# Patient Record
Sex: Female | Born: 1940 | Race: White | Hispanic: No | Marital: Married | Smoking: Former smoker
Health system: Southern US, Community
[De-identification: ages and names within clinical notes are randomized; demographics above are authoritative.]

## PROBLEM LIST (undated history)

## (undated) DIAGNOSIS — F0390 Unspecified dementia without behavioral disturbance: Secondary | ICD-10-CM

## (undated) DIAGNOSIS — I4891 Unspecified atrial fibrillation: Secondary | ICD-10-CM

## (undated) DIAGNOSIS — I639 Cerebral infarction, unspecified: Secondary | ICD-10-CM

## (undated) DIAGNOSIS — M1990 Unspecified osteoarthritis, unspecified site: Secondary | ICD-10-CM

## (undated) DIAGNOSIS — F419 Anxiety disorder, unspecified: Secondary | ICD-10-CM

## (undated) DIAGNOSIS — I10 Essential (primary) hypertension: Secondary | ICD-10-CM

## (undated) DIAGNOSIS — C801 Malignant (primary) neoplasm, unspecified: Secondary | ICD-10-CM

## (undated) HISTORY — PX: CHOLECYSTECTOMY: SHX55

## (undated) HISTORY — PX: TONSILLECTOMY: SUR1361

## (undated) HISTORY — PX: BRAIN SURGERY: SHX531

## (undated) HISTORY — PX: FEMUR CLOSED REDUCTION: SHX939

## (undated) HISTORY — PX: KNEE ARTHROSCOPY: SUR90

## (undated) HISTORY — PX: APPENDECTOMY: SHX54

---

## 1998-11-23 ENCOUNTER — Other Ambulatory Visit: Admission: RE | Admit: 1998-11-23 | Discharge: 1998-11-23 | Payer: Self-pay | Admitting: Gynecology

## 1998-12-05 ENCOUNTER — Ambulatory Visit (HOSPITAL_COMMUNITY): Admission: RE | Admit: 1998-12-05 | Discharge: 1998-12-05 | Payer: Self-pay | Admitting: Gynecology

## 1999-07-27 ENCOUNTER — Encounter: Payer: Self-pay | Admitting: Specialist

## 1999-07-27 ENCOUNTER — Ambulatory Visit (HOSPITAL_COMMUNITY): Admission: RE | Admit: 1999-07-27 | Discharge: 1999-07-27 | Payer: Self-pay | Admitting: Specialist

## 1999-08-18 ENCOUNTER — Encounter: Payer: Self-pay | Admitting: Specialist

## 1999-08-18 ENCOUNTER — Ambulatory Visit (HOSPITAL_COMMUNITY): Admission: RE | Admit: 1999-08-18 | Discharge: 1999-08-18 | Payer: Self-pay | Admitting: Specialist

## 1999-10-20 ENCOUNTER — Encounter: Payer: Self-pay | Admitting: Gynecology

## 1999-10-20 ENCOUNTER — Encounter: Admission: RE | Admit: 1999-10-20 | Discharge: 1999-10-20 | Payer: Self-pay | Admitting: Gynecology

## 2000-10-17 ENCOUNTER — Ambulatory Visit (HOSPITAL_COMMUNITY): Admission: RE | Admit: 2000-10-17 | Discharge: 2000-10-17 | Payer: Self-pay | Admitting: Obstetrics and Gynecology

## 2000-10-21 ENCOUNTER — Encounter: Payer: Self-pay | Admitting: Obstetrics and Gynecology

## 2000-10-21 ENCOUNTER — Encounter: Admission: RE | Admit: 2000-10-21 | Discharge: 2000-10-21 | Payer: Self-pay | Admitting: Obstetrics and Gynecology

## 2001-03-12 ENCOUNTER — Encounter: Admission: RE | Admit: 2001-03-12 | Discharge: 2001-03-12 | Payer: Self-pay | Admitting: Internal Medicine

## 2001-03-12 ENCOUNTER — Encounter: Payer: Self-pay | Admitting: Internal Medicine

## 2001-10-30 ENCOUNTER — Encounter: Admission: RE | Admit: 2001-10-30 | Discharge: 2001-10-30 | Payer: Self-pay | Admitting: Obstetrics and Gynecology

## 2001-10-30 ENCOUNTER — Encounter: Payer: Self-pay | Admitting: Obstetrics and Gynecology

## 2002-07-13 ENCOUNTER — Observation Stay (HOSPITAL_COMMUNITY): Admission: EM | Admit: 2002-07-13 | Discharge: 2002-07-14 | Payer: Self-pay | Admitting: Emergency Medicine

## 2002-09-18 ENCOUNTER — Encounter: Payer: Self-pay | Admitting: Internal Medicine

## 2002-09-18 ENCOUNTER — Encounter: Admission: RE | Admit: 2002-09-18 | Discharge: 2002-09-18 | Payer: Self-pay | Admitting: Internal Medicine

## 2003-01-21 ENCOUNTER — Encounter: Admission: RE | Admit: 2003-01-21 | Discharge: 2003-01-21 | Payer: Self-pay | Admitting: Urology

## 2003-01-21 ENCOUNTER — Encounter: Payer: Self-pay | Admitting: Urology

## 2003-01-27 ENCOUNTER — Ambulatory Visit (HOSPITAL_BASED_OUTPATIENT_CLINIC_OR_DEPARTMENT_OTHER): Admission: RE | Admit: 2003-01-27 | Discharge: 2003-01-27 | Payer: Self-pay | Admitting: Urology

## 2003-01-27 ENCOUNTER — Encounter (INDEPENDENT_AMBULATORY_CARE_PROVIDER_SITE_OTHER): Payer: Self-pay | Admitting: Specialist

## 2003-07-02 ENCOUNTER — Encounter: Admission: RE | Admit: 2003-07-02 | Discharge: 2003-07-02 | Payer: Self-pay | Admitting: Obstetrics and Gynecology

## 2003-10-20 ENCOUNTER — Encounter (INDEPENDENT_AMBULATORY_CARE_PROVIDER_SITE_OTHER): Payer: Self-pay | Admitting: Specialist

## 2003-10-20 ENCOUNTER — Ambulatory Visit (HOSPITAL_BASED_OUTPATIENT_CLINIC_OR_DEPARTMENT_OTHER): Admission: RE | Admit: 2003-10-20 | Discharge: 2003-10-20 | Payer: Self-pay | Admitting: Urology

## 2003-10-20 ENCOUNTER — Ambulatory Visit (HOSPITAL_COMMUNITY): Admission: RE | Admit: 2003-10-20 | Discharge: 2003-10-20 | Payer: Self-pay | Admitting: Urology

## 2004-07-04 ENCOUNTER — Ambulatory Visit: Payer: Self-pay | Admitting: Internal Medicine

## 2004-07-27 ENCOUNTER — Encounter: Admission: RE | Admit: 2004-07-27 | Discharge: 2004-07-27 | Payer: Self-pay | Admitting: Obstetrics and Gynecology

## 2004-08-01 ENCOUNTER — Ambulatory Visit: Payer: Self-pay | Admitting: Internal Medicine

## 2004-08-09 ENCOUNTER — Encounter: Admission: RE | Admit: 2004-08-09 | Discharge: 2004-08-09 | Payer: Self-pay | Admitting: Urology

## 2004-08-11 ENCOUNTER — Ambulatory Visit (HOSPITAL_BASED_OUTPATIENT_CLINIC_OR_DEPARTMENT_OTHER): Admission: RE | Admit: 2004-08-11 | Discharge: 2004-08-11 | Payer: Self-pay | Admitting: Urology

## 2004-08-11 ENCOUNTER — Encounter (INDEPENDENT_AMBULATORY_CARE_PROVIDER_SITE_OTHER): Payer: Self-pay | Admitting: Specialist

## 2004-08-11 ENCOUNTER — Ambulatory Visit (HOSPITAL_COMMUNITY): Admission: RE | Admit: 2004-08-11 | Discharge: 2004-08-11 | Payer: Self-pay | Admitting: Urology

## 2004-09-12 ENCOUNTER — Ambulatory Visit: Payer: Self-pay | Admitting: Internal Medicine

## 2004-11-10 ENCOUNTER — Ambulatory Visit: Payer: Self-pay | Admitting: Internal Medicine

## 2005-02-01 ENCOUNTER — Ambulatory Visit: Payer: Self-pay | Admitting: Internal Medicine

## 2005-03-17 ENCOUNTER — Encounter: Admission: RE | Admit: 2005-03-17 | Discharge: 2005-03-17 | Payer: Self-pay | Admitting: Neurology

## 2005-03-23 ENCOUNTER — Ambulatory Visit: Payer: Self-pay | Admitting: Internal Medicine

## 2005-04-09 ENCOUNTER — Ambulatory Visit: Payer: Self-pay | Admitting: Internal Medicine

## 2005-05-11 ENCOUNTER — Ambulatory Visit: Payer: Self-pay | Admitting: Internal Medicine

## 2005-06-15 ENCOUNTER — Ambulatory Visit: Payer: Self-pay | Admitting: Internal Medicine

## 2005-06-21 ENCOUNTER — Ambulatory Visit: Payer: Self-pay | Admitting: Internal Medicine

## 2005-06-21 ENCOUNTER — Encounter: Admission: RE | Admit: 2005-06-21 | Discharge: 2005-06-21 | Payer: Self-pay | Admitting: Internal Medicine

## 2005-08-15 ENCOUNTER — Ambulatory Visit: Payer: Self-pay | Admitting: Internal Medicine

## 2005-09-11 ENCOUNTER — Ambulatory Visit: Payer: Self-pay | Admitting: Internal Medicine

## 2005-09-14 ENCOUNTER — Ambulatory Visit: Payer: Self-pay | Admitting: Internal Medicine

## 2005-09-20 ENCOUNTER — Ambulatory Visit: Payer: Self-pay | Admitting: Internal Medicine

## 2005-10-02 ENCOUNTER — Encounter: Admission: RE | Admit: 2005-10-02 | Discharge: 2005-10-02 | Payer: Self-pay | Admitting: Obstetrics and Gynecology

## 2005-10-18 ENCOUNTER — Ambulatory Visit: Payer: Self-pay | Admitting: Internal Medicine

## 2005-11-15 ENCOUNTER — Ambulatory Visit: Payer: Self-pay | Admitting: Internal Medicine

## 2005-12-14 ENCOUNTER — Ambulatory Visit: Payer: Self-pay | Admitting: Internal Medicine

## 2005-12-18 ENCOUNTER — Ambulatory Visit: Payer: Self-pay | Admitting: Internal Medicine

## 2006-01-17 ENCOUNTER — Ambulatory Visit: Payer: Self-pay | Admitting: Internal Medicine

## 2006-02-21 ENCOUNTER — Ambulatory Visit: Payer: Self-pay | Admitting: Gastroenterology

## 2006-03-07 ENCOUNTER — Encounter (INDEPENDENT_AMBULATORY_CARE_PROVIDER_SITE_OTHER): Payer: Self-pay | Admitting: Specialist

## 2006-03-07 ENCOUNTER — Ambulatory Visit: Payer: Self-pay | Admitting: Gastroenterology

## 2006-03-08 ENCOUNTER — Encounter (INDEPENDENT_AMBULATORY_CARE_PROVIDER_SITE_OTHER): Payer: Self-pay | Admitting: Family Medicine

## 2006-04-16 ENCOUNTER — Ambulatory Visit: Payer: Self-pay | Admitting: Internal Medicine

## 2006-05-09 ENCOUNTER — Ambulatory Visit: Payer: Self-pay | Admitting: Internal Medicine

## 2006-07-30 ENCOUNTER — Ambulatory Visit: Payer: Self-pay | Admitting: Internal Medicine

## 2006-10-07 ENCOUNTER — Encounter: Admission: RE | Admit: 2006-10-07 | Discharge: 2006-10-07 | Payer: Self-pay | Admitting: Obstetrics and Gynecology

## 2006-12-03 ENCOUNTER — Ambulatory Visit: Payer: Self-pay | Admitting: Internal Medicine

## 2006-12-05 ENCOUNTER — Encounter: Admission: RE | Admit: 2006-12-05 | Discharge: 2007-01-16 | Payer: Self-pay | Admitting: Orthopedic Surgery

## 2007-01-23 ENCOUNTER — Ambulatory Visit: Payer: Self-pay | Admitting: Internal Medicine

## 2007-01-23 ENCOUNTER — Inpatient Hospital Stay (HOSPITAL_COMMUNITY): Admission: EM | Admit: 2007-01-23 | Discharge: 2007-01-26 | Payer: Self-pay | Admitting: Emergency Medicine

## 2007-01-24 ENCOUNTER — Encounter: Payer: Self-pay | Admitting: Internal Medicine

## 2007-02-04 ENCOUNTER — Encounter: Payer: Self-pay | Admitting: Internal Medicine

## 2007-02-04 DIAGNOSIS — I10 Essential (primary) hypertension: Secondary | ICD-10-CM

## 2007-02-04 DIAGNOSIS — Z860100 Personal history of colon polyps, unspecified: Secondary | ICD-10-CM | POA: Insufficient documentation

## 2007-02-04 DIAGNOSIS — K5730 Diverticulosis of large intestine without perforation or abscess without bleeding: Secondary | ICD-10-CM | POA: Insufficient documentation

## 2007-02-04 DIAGNOSIS — E785 Hyperlipidemia, unspecified: Secondary | ICD-10-CM | POA: Insufficient documentation

## 2007-02-04 DIAGNOSIS — K219 Gastro-esophageal reflux disease without esophagitis: Secondary | ICD-10-CM

## 2007-02-07 ENCOUNTER — Ambulatory Visit: Payer: Self-pay | Admitting: Family Medicine

## 2007-02-10 ENCOUNTER — Ambulatory Visit: Payer: Self-pay | Admitting: Internal Medicine

## 2007-03-03 ENCOUNTER — Ambulatory Visit: Payer: Self-pay | Admitting: Internal Medicine

## 2007-03-17 ENCOUNTER — Telehealth: Payer: Self-pay | Admitting: Internal Medicine

## 2007-03-19 ENCOUNTER — Encounter (INDEPENDENT_AMBULATORY_CARE_PROVIDER_SITE_OTHER): Payer: Self-pay

## 2007-03-21 ENCOUNTER — Ambulatory Visit: Payer: Self-pay | Admitting: Internal Medicine

## 2007-04-10 ENCOUNTER — Telehealth: Payer: Self-pay | Admitting: Internal Medicine

## 2007-04-15 ENCOUNTER — Ambulatory Visit: Payer: Self-pay | Admitting: Internal Medicine

## 2007-06-13 ENCOUNTER — Telehealth (INDEPENDENT_AMBULATORY_CARE_PROVIDER_SITE_OTHER): Payer: Self-pay | Admitting: *Deleted

## 2007-07-15 ENCOUNTER — Ambulatory Visit: Payer: Self-pay | Admitting: Internal Medicine

## 2007-07-15 LAB — CONVERTED CEMR LAB
Cholesterol: 206 mg/dL (ref 0–200)
Direct LDL: 137.6 mg/dL
HDL: 35.8 mg/dL — ABNORMAL LOW (ref >39.0)
Total CHOL/HDL Ratio: 5.8
Triglycerides: 209 mg/dL (ref 0–149)
VLDL: 42 mg/dL — ABNORMAL HIGH (ref 0–40)

## 2007-07-16 ENCOUNTER — Encounter: Payer: Self-pay | Admitting: Internal Medicine

## 2007-07-16 ENCOUNTER — Telehealth: Payer: Self-pay | Admitting: Internal Medicine

## 2007-07-21 ENCOUNTER — Telehealth: Payer: Self-pay | Admitting: Internal Medicine

## 2007-08-07 ENCOUNTER — Telehealth: Payer: Self-pay | Admitting: Internal Medicine

## 2007-08-07 ENCOUNTER — Ambulatory Visit: Payer: Self-pay | Admitting: Internal Medicine

## 2007-08-07 LAB — CONVERTED CEMR LAB
CRP, High Sensitivity: 5 (ref 0.00–5.00)
Cholesterol: 186 mg/dL (ref 0–200)

## 2007-08-11 ENCOUNTER — Telehealth (INDEPENDENT_AMBULATORY_CARE_PROVIDER_SITE_OTHER): Payer: Self-pay | Admitting: *Deleted

## 2007-08-15 ENCOUNTER — Encounter: Payer: Self-pay | Admitting: Internal Medicine

## 2007-08-15 ENCOUNTER — Encounter: Admission: RE | Admit: 2007-08-15 | Discharge: 2007-08-15 | Payer: Self-pay | Admitting: Internal Medicine

## 2007-08-15 ENCOUNTER — Encounter: Admission: RE | Admit: 2007-08-15 | Discharge: 2007-08-15 | Payer: Self-pay | Admitting: Neurology

## 2007-08-19 ENCOUNTER — Telehealth: Payer: Self-pay | Admitting: Internal Medicine

## 2007-09-29 ENCOUNTER — Encounter (INDEPENDENT_AMBULATORY_CARE_PROVIDER_SITE_OTHER): Payer: Self-pay | Admitting: Family Medicine

## 2007-09-29 ENCOUNTER — Encounter: Payer: Self-pay | Admitting: Internal Medicine

## 2007-10-30 ENCOUNTER — Telehealth: Payer: Self-pay | Admitting: Internal Medicine

## 2007-11-05 ENCOUNTER — Encounter: Admission: RE | Admit: 2007-11-05 | Discharge: 2007-11-05 | Payer: Self-pay | Admitting: Obstetrics and Gynecology

## 2007-11-13 ENCOUNTER — Ambulatory Visit: Payer: Self-pay | Admitting: Internal Medicine

## 2007-11-13 LAB — CONVERTED CEMR LAB
AST: 33 units/L (ref 0–37)
Cholesterol: 169 mg/dL (ref 0–200)

## 2007-12-09 ENCOUNTER — Telehealth: Payer: Self-pay | Admitting: Internal Medicine

## 2008-04-21 ENCOUNTER — Ambulatory Visit: Payer: Self-pay | Admitting: Family Medicine

## 2008-04-21 DIAGNOSIS — F411 Generalized anxiety disorder: Secondary | ICD-10-CM | POA: Insufficient documentation

## 2008-04-21 DIAGNOSIS — R32 Unspecified urinary incontinence: Secondary | ICD-10-CM

## 2008-04-21 DIAGNOSIS — M1990 Unspecified osteoarthritis, unspecified site: Secondary | ICD-10-CM | POA: Insufficient documentation

## 2008-04-21 DIAGNOSIS — F329 Major depressive disorder, single episode, unspecified: Secondary | ICD-10-CM | POA: Insufficient documentation

## 2008-04-21 DIAGNOSIS — G4700 Insomnia, unspecified: Secondary | ICD-10-CM

## 2008-04-21 DIAGNOSIS — M5030 Other cervical disc degeneration, unspecified cervical region: Secondary | ICD-10-CM | POA: Insufficient documentation

## 2008-04-21 DIAGNOSIS — C679 Malignant neoplasm of bladder, unspecified: Secondary | ICD-10-CM | POA: Insufficient documentation

## 2008-04-21 DIAGNOSIS — M51379 Other intervertebral disc degeneration, lumbosacral region without mention of lumbar back pain or lower extremity pain: Secondary | ICD-10-CM | POA: Insufficient documentation

## 2008-04-21 DIAGNOSIS — M5137 Other intervertebral disc degeneration, lumbosacral region: Secondary | ICD-10-CM

## 2008-04-21 LAB — CONVERTED CEMR LAB
Bilirubin Urine: NEGATIVE
HDL goal, serum: 40 mg/dL
Ketones, urine, test strip: NEGATIVE
LDL Goal: 130 mg/dL
Nitrite: NEGATIVE
Specific Gravity, Urine: <1.005
Urobilinogen, UA: 0.2
WBC Urine, dipstick: NEGATIVE

## 2008-04-22 LAB — CONVERTED CEMR LAB
BUN: 7 mg/dL (ref 6–23)
CO2: 20 meq/L (ref 19–32)
Calcium: 9.2 mg/dL (ref 8.4–10.5)
Chloride: 102 meq/L (ref 96–112)
Cholesterol: 159 mg/dL (ref 0–200)
Creatinine, Ser: 0.84 mg/dL (ref 0.40–1.20)
Eosinophils Relative: 4 % (ref 0–5)
Glucose, Bld: 95 mg/dL (ref 70–99)
HCT: 42.5 % (ref 36.0–46.0)
HDL: 43 mg/dL (ref >39)
Hemoglobin: 14.1 g/dL (ref 12.0–15.0)
Lymphocytes Relative: 34 % (ref 12–46)
MCHC: 33.2 g/dL (ref 30.0–36.0)
Monocytes Absolute: 0.5 10*3/uL (ref 0.1–1.0)
Monocytes Relative: 8 % (ref 3–12)
Neutro Abs: 3.6 10*3/uL (ref 1.7–7.7)
RBC: 5.12 M/uL — ABNORMAL HIGH (ref 3.87–5.11)
Total Bilirubin: 0.5 mg/dL (ref 0.3–1.2)
Total CHOL/HDL Ratio: 3.7
Triglycerides: 231 mg/dL — ABNORMAL HIGH (ref <150)

## 2008-04-23 ENCOUNTER — Encounter (INDEPENDENT_AMBULATORY_CARE_PROVIDER_SITE_OTHER): Payer: Self-pay | Admitting: Family Medicine

## 2008-05-05 ENCOUNTER — Ambulatory Visit: Payer: Self-pay | Admitting: Family Medicine

## 2008-05-05 ENCOUNTER — Emergency Department (HOSPITAL_COMMUNITY): Admission: EM | Admit: 2008-05-05 | Discharge: 2008-05-05 | Payer: Self-pay | Admitting: Emergency Medicine

## 2008-05-05 LAB — CONVERTED CEMR LAB: LDL Goal: 100 mg/dL

## 2008-05-06 ENCOUNTER — Telehealth (INDEPENDENT_AMBULATORY_CARE_PROVIDER_SITE_OTHER): Payer: Self-pay | Admitting: Family Medicine

## 2008-05-11 ENCOUNTER — Ambulatory Visit: Payer: Self-pay | Admitting: Family Medicine

## 2008-05-17 ENCOUNTER — Telehealth (INDEPENDENT_AMBULATORY_CARE_PROVIDER_SITE_OTHER): Payer: Self-pay | Admitting: Family Medicine

## 2008-05-25 ENCOUNTER — Ambulatory Visit: Payer: Self-pay | Admitting: Family Medicine

## 2008-05-28 ENCOUNTER — Telehealth (INDEPENDENT_AMBULATORY_CARE_PROVIDER_SITE_OTHER): Payer: Self-pay | Admitting: Family Medicine

## 2008-06-11 ENCOUNTER — Ambulatory Visit: Payer: Self-pay | Admitting: Family Medicine

## 2008-08-16 ENCOUNTER — Telehealth (INDEPENDENT_AMBULATORY_CARE_PROVIDER_SITE_OTHER): Payer: Self-pay | Admitting: Family Medicine

## 2008-08-25 ENCOUNTER — Telehealth (INDEPENDENT_AMBULATORY_CARE_PROVIDER_SITE_OTHER): Payer: Self-pay | Admitting: Family Medicine

## 2008-09-07 ENCOUNTER — Ambulatory Visit: Payer: Self-pay | Admitting: Family Medicine

## 2008-09-07 ENCOUNTER — Telehealth (INDEPENDENT_AMBULATORY_CARE_PROVIDER_SITE_OTHER): Payer: Self-pay | Admitting: Family Medicine

## 2008-09-08 ENCOUNTER — Encounter (INDEPENDENT_AMBULATORY_CARE_PROVIDER_SITE_OTHER): Payer: Self-pay | Admitting: Family Medicine

## 2008-09-09 LAB — CONVERTED CEMR LAB
ALT: 15 units/L (ref 0–35)
Basophils Absolute: 0.1 10*3/uL (ref 0.0–0.1)
CO2: 20 meq/L (ref 19–32)
Cholesterol: 158 mg/dL (ref 0–200)
Creatinine, Ser: 0.77 mg/dL (ref 0.40–1.20)
Eosinophils Absolute: 0.2 10*3/uL (ref 0.0–0.7)
Eosinophils Relative: 3 % (ref 0–5)
HCT: 45.3 % (ref 36.0–46.0)
Hemoglobin: 14.3 g/dL (ref 12.0–15.0)
Lymphocytes Relative: 27 % (ref 12–46)
MCHC: 31.6 g/dL (ref 30.0–36.0)
MCV: 87.3 fL (ref 78.0–100.0)
Monocytes Absolute: 0.5 10*3/uL (ref 0.1–1.0)
RDW: 15.2 % (ref 11.5–15.5)
Total Bilirubin: 0.4 mg/dL (ref 0.3–1.2)
Total CHOL/HDL Ratio: 3.2
Triglycerides: 182 mg/dL — ABNORMAL HIGH (ref <150)
VLDL: 36 mg/dL (ref 0–40)

## 2008-09-10 ENCOUNTER — Encounter (INDEPENDENT_AMBULATORY_CARE_PROVIDER_SITE_OTHER): Payer: Self-pay | Admitting: Family Medicine

## 2008-09-15 ENCOUNTER — Ambulatory Visit (HOSPITAL_COMMUNITY): Payer: Self-pay | Admitting: Psychology

## 2008-10-06 ENCOUNTER — Encounter (INDEPENDENT_AMBULATORY_CARE_PROVIDER_SITE_OTHER): Payer: Self-pay | Admitting: Family Medicine

## 2008-10-06 ENCOUNTER — Ambulatory Visit (HOSPITAL_COMMUNITY): Payer: Self-pay | Admitting: Psychology

## 2008-10-22 ENCOUNTER — Ambulatory Visit: Payer: Self-pay | Admitting: Family Medicine

## 2008-11-05 ENCOUNTER — Encounter: Admission: RE | Admit: 2008-11-05 | Discharge: 2008-11-05 | Payer: Self-pay | Admitting: Obstetrics and Gynecology

## 2008-11-23 ENCOUNTER — Ambulatory Visit: Payer: Self-pay | Admitting: Family Medicine

## 2008-11-25 LAB — CONVERTED CEMR LAB

## 2009-02-03 ENCOUNTER — Ambulatory Visit: Payer: Self-pay | Admitting: Family Medicine

## 2009-02-08 ENCOUNTER — Encounter (INDEPENDENT_AMBULATORY_CARE_PROVIDER_SITE_OTHER): Payer: Self-pay | Admitting: *Deleted

## 2009-02-22 ENCOUNTER — Ambulatory Visit: Payer: Self-pay | Admitting: Family Medicine

## 2009-02-22 DIAGNOSIS — M810 Age-related osteoporosis without current pathological fracture: Secondary | ICD-10-CM

## 2009-03-01 ENCOUNTER — Encounter (INDEPENDENT_AMBULATORY_CARE_PROVIDER_SITE_OTHER): Payer: Self-pay | Admitting: *Deleted

## 2009-03-01 ENCOUNTER — Ambulatory Visit (HOSPITAL_COMMUNITY): Admission: RE | Admit: 2009-03-01 | Discharge: 2009-03-01 | Payer: Self-pay | Admitting: Family Medicine

## 2009-03-01 ENCOUNTER — Encounter (INDEPENDENT_AMBULATORY_CARE_PROVIDER_SITE_OTHER): Payer: Self-pay | Admitting: Family Medicine

## 2009-03-08 ENCOUNTER — Telehealth (INDEPENDENT_AMBULATORY_CARE_PROVIDER_SITE_OTHER): Payer: Self-pay | Admitting: *Deleted

## 2009-04-13 ENCOUNTER — Ambulatory Visit: Payer: Self-pay | Admitting: Gastroenterology

## 2009-04-13 DIAGNOSIS — K621 Rectal polyp: Secondary | ICD-10-CM

## 2009-04-13 DIAGNOSIS — K620 Anal polyp: Secondary | ICD-10-CM | POA: Insufficient documentation

## 2009-11-08 ENCOUNTER — Encounter: Admission: RE | Admit: 2009-11-08 | Discharge: 2009-11-08 | Payer: Self-pay | Admitting: Obstetrics and Gynecology

## 2010-09-17 ENCOUNTER — Encounter: Payer: Self-pay | Admitting: Internal Medicine

## 2010-11-02 ENCOUNTER — Other Ambulatory Visit: Payer: Self-pay | Admitting: Obstetrics and Gynecology

## 2010-11-02 DIAGNOSIS — Z1231 Encounter for screening mammogram for malignant neoplasm of breast: Secondary | ICD-10-CM

## 2010-11-17 ENCOUNTER — Ambulatory Visit
Admission: RE | Admit: 2010-11-17 | Discharge: 2010-11-17 | Disposition: A | Discharge status: Home / Self Care | Payer: Medicare Other | Source: Ambulatory Visit | Attending: Obstetrics and Gynecology | Admitting: Obstetrics and Gynecology

## 2010-11-17 DIAGNOSIS — Z1231 Encounter for screening mammogram for malignant neoplasm of breast: Secondary | ICD-10-CM

## 2011-01-09 NOTE — Consult Note (Signed)
NAMEBURDETTE, GERGELY                ACCOUNT NO.:  [id number]   MEDICAL RECORD NO.:  [id number]          PATIENT TYPE:  INP   LOCATION:  3017                         FACILITY:  [facility]   PHYSICIAN:  Pramod P. Pearlean Brownie, MD    DATE OF BIRTH:  05-23-1941   DATE OF CONSULTATION:  01/23/2007  DATE OF DISCHARGE:                                 CONSULTATION   HISTORY OF PRESENT ILLNESS:  Mrs. Bradstreet is a 65-year Caucasian lady who  developed sudden onset of occipital headache as well as dizziness and  confusion and gait difficulty with bumping into objects yesterday.  Also  noticed some slurred speech.  The patient thought symptoms might go away  and she did not seek medical help today.  Her friend came and talked to  her and when she brought out a card which had stroke symptoms, the  patient realized that she had 6/7 and then she decided to come to the  emergency room.  She states the headache is better today and slurred  speech has improved.  She has a past neurological history of left brain  stroke in 2006.  At that time she had some right upper extremity  weakness and slurred speech which eventually improved without residual  deficit.  She saw, however, Dr. Kelli Hope and had an outpatient  MRI scan which showed left coronary __________  infarction.  She was  placed on Aggrenox which she has been taking regularly.   PAST MEDICAL HISTORY:  1. Hypertension.  2. Hyperlipidemia.  3. Tobacco abuse, having quit recently 2 months ago.  4. History of her multiple deep venous thromboses, post surgery.   MEDICATIONS:  1. Aggrenox twice a day.  2. Lopressor 50 mg a day.  3. Sular 40 mg a day.  4. Benazepril 20 mg a day.  5. Zetia 10 mg a day.  6. Meclizine p.r.n.  7. Clonidine 0.1 mg a day.  8. Spironolactone 2.5 daily.  9. Protonix 40 mg a day.  10.Vitamin D daily.   ALLERGIES TO MEDICATIONS:  None known.   FAMILY HISTORY:  Significant for mother with strokes in her late 24s.   SOCIAL HISTORY:  She is married, lives with her husband.  Quit smoking 2  months ago.  Has two beers a day.  She is independent with activities of  daily living.   REVIEW OF SYSTEMS:  Documented separately in the chart. Positive for  dizziness, headache, slurred speech. No chest pain, fever, cough,  shortness of breath or diarrhea.   PHYSICAL EXAMINATION:  GENERAL:  Pleasant, obese, middle-aged, Caucasian  lady who is not in distress.  VITAL SIGNS:  She is afebrile.  Pulse rate is 70, regular sinus.  Respiratory rate 18 per minute, blood pressure 134/73, temperature 97.3.  Saturation  98% on room air.  HEENT:  Head is nontraumatic.  ENT exam is unremarkable.  NECK:  Supple without bruit.  CARDIAC:  No murmur or gallop.  LUNGS:  Clear to auscultation.  ABDOMEN:  Soft, nontender.  NEUROLOGIC:  The patient is awake, alert, oriented x3 with normal speech  and language function.  She has intact attention, registration and  recall 3 out of 3.  Eye movements are full range without nystagmus.  She  has a dense left homonymous hemianopsia.  Visual acuity is adequate.  Face is symmetric.  Tongue is midline.  Palatal movements normal.  Motor  system exam reveals no upper extremity drift, symmetric strength, tone,  reflexes, coordination, sensation, and plantars are both downgoing.  Gait was not tested.   LABORATORY DATA:  Data reviewed noncontrast CT scan of the head done  today reveals a subacute right occipital infarct without hemorrhage or  mass effect.  MRI scan of the brain July 2006 shows left coronary  __________  infarct.  MRA of the brain and neck show no significant  large vessel stenosis.  Admission labs reveal normal electrolytes, BUN,  creatinine.   IMPRESSION:  A 65-year lady with subacute right occipital infarct,  etiology to be determined.  Vascular risk factors of hypertension,  hyperlipidemia, previous stroke, obesity and family history.   PLAN:  I agree with admitting  the patient for further stroke workup to  the medical service.  Check MRI scan of the brain with MRA of the brain,  2-D echo, carotid Doppler, transcranial Doppler studies, fasting lipid  profile, hemoglobin A1C, and homocysteine.  Physical  therapy/occupational therapy consult.  Counseling for smoking cessation  as well as alcohol cessation.  She will not be able to drive due to her  visual field defect.  I am happy to follow the patient in consults.  I  had long discussion with the patient and her  husband and answered  questions.           ______________________________  Sunny Schlein. Pearlean Brownie, MD     PPS/MEDQ  D:  01/23/2007  T:  01/24/2007  Job:  [id number]

## 2011-01-09 NOTE — Discharge Summary (Signed)
Tami [name], Tami [name]                ACCOUNT NO.:  [id number]   MEDICAL RECORD NO.:  [id number]          PATIENT TYPE:  INP   LOCATION:  3017                         FACILITY:  [facility]   PHYSICIAN:  Gordy Savers, MDDATE OF BIRTH:  [birth year]   DATE OF ADMISSION:  01/23/2007  DATE OF DISCHARGE:  01/26/2007                               DISCHARGE SUMMARY   FINAL DIAGNOSIS:  Right posterior cerebral artery stroke.   ADDITIONAL DIAGNOSES:  1. Hypertension.  2. Hyperlipidemia.  3. History of tobacco abuse.   DISCHARGE MEDICATIONS:  1. Aggrenox b.i.d.  2. Benicar 20 mg daily .  3. Catapres 0.1 b.i.d.  4. Toprol XL 50 daily.  5. Sular 40 daily.  6. Protonix 40 daily.  7. Zetia 10 mg daily.  8. Spironolactone 25 mg daily.  9. Meclizine 25 mg t.i.d. p.r.n. dizziness.  10.Vitamin D supplements.  11.Foltx 1 tablet daily.  12.Gabapentin 200 mg every 4 hours p.r.n.   HISTORY OF PRESENT ILLNESS:  The patient is a 70 year old Caucasian lady  who presented with a sudden onset of occipital headaches, dizziness,  confusion, with some gait instability.  She had also noted some slurred  speech.  She has had a prior history of left brain stroke in 2006.  The  patient was subsequently admitted for further evaluation and treatment  of suspected acute stroke.   LABORATORY DATA AND HOSPITAL COURSE:  The patient was seen in  consultation by the neurology service and a brain MRI, MRA, 2-D echo,  carotid Doppler and transcranial Doppler studies were performed.  Routine laboratory studies, including lipid profile, as well as a  homocysteine level were also obtained.  The patient was seen in  consultation by Dr. PT and OT.   Hospital course was marked by steady improvement.  Her occipital  headaches resolved.  Imaging studies confirmed an acute right occipital  infarct.  At the time of discharge, she was ambulatory, although  slightly unsteady.  Her main deficit was a dense left homonymous  hemianopsia, causing difficulty with ambulatory due to the visual  deficit.  Cessation of smoking encouraged.   Laboratory studies included chemistries that were unremarkable.  A total  cholesterol was 247 with an LDL of 160.   During the hospital period, the patient was started on statin therapy,  which she apparently has not tolerated in the past due to elevated liver  function studies.  White count was 6.4, hemoglobin and hematocrit 12.4  and 36.8.  A 2-D echocardiogram revealed normal LV function.  Carotid  artery Doppler studies were negative.   DISPOSITION:  The patient will be discharged today with office followup  later this week.  She will be discharged on the medications listed  above.  In addition, will also be discharged on simvastatin 20 mg daily.  She will be scheduled for outpatient OT and PT therapy.   CONDITION ON DISCHARGE:  Improved.      Gordy Savers, MD  Electronically Signed     PFK/MEDQ  D:  01/26/2007  T:  01/26/2007  Job:  [id number]

## 2011-01-09 NOTE — Assessment & Plan Note (Signed)
[facility]                                 ON-CALL NOTE   NAME:Tami [name], Tami [name]                         MRN:          [id number]  DATE:02/02/2007                            DOB:          05/02/1941    PRIMARY CARE PHYSICIAN:  Dr. Amador Cunas   TIME OF CALL:  11:03am   TELEPHONE NUMBER:  [phone]   CALLER:  Beatriz Chancellor, her daughter-in-law.   The patient's daughter-in-law says that she just got out of the hospital  on Monday for stroke of the right occipital lobe. She has lost half of  her field of vision in both eyes. She does not have a neuro-  ophthalmologist referral, she did not get any referral for home care.  She is in town from [place] and says that they have to leave soon, but  they are worried that she does not have enough care. She is having some  trouble into her bed herself. Her blood pressure on a list of medicines  including extra Clonidine p.r.n. has ranged anywhere from 98/71 to  130s/70s and the highest was 174/88. She feels generally somewhat  listless and her back has been aching, but otherwise feels about the  same. They are worried about her blood pressure and her lack of  referrals right now and thinks that she needs some more help at home. I  told her to go ahead and keep giving her current blood pressure  medicines if her blood pressure gets lower or higher than it has been,  but if it is lower than the lower 70s or higher than the 180s/90s to  please call me back, otherwise they are going to watch her closely and I  promised that I would call Dr. Vernon Prey office on Monday to make  sure that they get a call back regarding a home healthcare referral. She  said that her cel phone number for being out of town is [phone] and  I told her that I would relay the message.     Marne A. Tower, MD  Electronically Signed    MAT/MedQ  DD: 02/02/2007  DT: 02/03/2007  Job #: [id number]   cc:   Gordy Savers, MD

## 2011-01-12 NOTE — Op Note (Signed)
NAME:  Tami [name], Tami [name]                          ACCOUNT NO.:  [id number]   MEDICAL RECORD NO.:  [id number]                   PATIENT TYPE:  AMB   LOCATION:  NESC                                 FACILITY:  [facility]   PHYSICIAN:  Rozanna Boer., M.D.      DATE OF BIRTH:  1941/06/05   DATE OF PROCEDURE:  10/20/2003  DATE OF DISCHARGE:                                 OPERATIVE REPORT   PREOPERATIVE DIAGNOSIS:  Recurrent bladder tumors, right posterior base.   POSTOPERATIVE DIAGNOSIS:  Recurrent bladder tumors, right posterior base.   OPERATION:  Transurethral resection of bladder tumor, right posterior base  (3 cm).   ANESTHESIA:  General.   SURGEON:  Courtney Paris, M.D.   BRIEF HISTORY:  This 70 year old patient admitted with recurrent bladder  cancer found on routine surveillance cystoscopy.  Her first tumor was  resected June 2004.  These were low-grade, noninvasive urothelial cancer.  She has had no voiding symptoms, no irritation, does have some stress  incontinence.  She enters for resection of these lesions that were found on  cystoscopy.  She does smoke about a pack a day and has done so for 46 years.   The patient was placed on the operating table in the dorsal lithotomy  position, after satisfactory induction of general anesthesia was prepped and  draped with Betadine in the usual sterile fashion.  The panendoscope was  inserted and the bladder inspected using the right angle and Foroblique  lens.  She had two approximately 1 cm tumors that were papillary on the  right lateral wall just behind the orifice on the right side.  These were  photographed and then with the biopsy forceps, I was able to with several  bites remove these two lesions.  A total of about 3 cm was resected.  The  Bugbee was then used to fulgurate the base and good hemostasis was achieved.  The bladder was drained.  I did not leave a Foley catheter.  She was taken  to the recovery room in  good condition to be later discharged as an  outpatient.                                               Rozanna Boer., M.D.    HMK/MEDQ  D:  10/20/2003  T:  10/20/2003  Job:  [id number]

## 2011-01-12 NOTE — Op Note (Signed)
[facility]  Patient:    Tami [name], Tami [name]                       MRN: [id number] Proc. Date: 10/17/00 Adm. Date:  [id number] Disc. Date: [id number] Attending:  Lendon Colonel                           Operative Report  PREOPERATIVE DIAGNOSES:       1. Persistent postmenopausal bleeding and normal                                  endometrial stripe on ultrasound.                               2. Cervical stenosis.  POSTOPERATIVE DIAGNOSES:      1. Persistent postmenopausal bleeding and normal                                  endometrial stripe on ultrasound.                               2. Cervical stenosis.  OPERATION:                    Hysteroscopy with ablation.  SURGEON:                      Katherine Roan, M.D.  DESCRIPTION OF PROCEDURE:     The patient was placed in the lithotomy position and prepped and draped in the usual sterile fashion. The cervix dilated, hysteroscope was inserted into the uterus. The uterus was quite small, atrophic and very thinned as ultrasound had predicted, no unusual abnormalities were noted, no polyps or hyperplastic tissue was noted. The endometrium was then ablated without difficulty. No unusual fluid deficit occurred. Mrs. Carrillo tolerated the procedure well and was sent to the recovery room in good condition. DD:  10/17/00 TD:  10/19/00 Job: [id number]

## 2011-01-12 NOTE — Op Note (Signed)
NAME:  Tami [name], FLOOR                          ACCOUNT NO.:  [id number]   MEDICAL RECORD NO.:  [id number]                   PATIENT TYPE:  AMB   LOCATION:  NESC                                 [facility]ACILITY[facility]  [facility]   PHYSICIAN:  Rozanna Boer., M.D.      DATE OF BIRTH:  07-Apr-1941   DATE OF PROCEDURE:  01/27/2003  DATE OF DISCHARGE:                                 OPERATIVE REPORT   PREOPERATIVE DIAGNOSIS:  Large sessile bladder tumor right lateral wall.   POSTOPERATIVE DIAGNOSIS:  Large sessile bladder tumor right lateral wall.   OPERATION PERFORMED:  Transurethral resection bladder tumor (3.5 cm) right  lateral wall.   ANESTHESIA:  General.   SURGEON:  Courtney Paris, M.D.   BRIEF HISTORY:  This 70- year-old lady is admitted with a bladder tumor  found on office cysto.  She has had bleeding for several weeks and low back  pain.  On cystoscopy she was found to have a bladder tumor at the right base  and enters to have this resected.  It is seen to be lateral to the orifice.  She does smoke a pack a day.   DESCRIPTION OF PROCEDURE:  The patient was placed on the operating table in  dorsal lithotomy position and after satisfactory induction of general  anesthesia was prepped and draped with Betadine in the usual sterile  fashion.  She was given some IV antibiotics.  The resectoscope was inserted  and with the right angle lens I tried to take some pictures but there was  some problem with the visualization and this was not adequate.  It was a  fairly large tumor, at least 3.5 cm across the base, and it wasn't until I  changed the resectoscope that I could actually see well enough to resect  this.  All of this was resected just lateral to the right orifice down into  the muscle and no perforations were made.  The chips were evacuated from the  bladder and in viewing from the bladder neck, hemostasis was good.  A 22  Foley catheter was inserted and left to straight  drainage.  The irrigant was  clear.  She will leave this in until the first of next week and remove it at  the office and come back in a week for follow up.  She will be called  regarding the Pathology report when available and will be encouraged in  stopping smoking which is probably the most common cause of this.                                               Rozanna Boer., M.D.    HMK/MEDQ  D:  01/27/2003  T:  01/27/2003  Job:  [id number]   cc:   Gordy Savers, M.D.  [facility]

## 2011-01-12 NOTE — Discharge Summary (Signed)
Tami [name], Tami [name]                          ACCOUNT NO.:  [id number]   MEDICAL RECORD NO.:  [id number]                   PATIENT TYPE:  INP   LOCATION:  3728                                 FACILITY:  [facility]   PHYSICIAN:  Rene Paci, M.D. [facility]          DATE OF BIRTH:  1941/01/26   DATE OF ADMISSION:  07/13/2002  DATE OF DISCHARGE:  07/14/2002                                 DISCHARGE SUMMARY   ADMISSION DIAGNOSIS:  Anaphylactic reaction to yellow jacket sting.   DISCHARGE DIAGNOSIS:  Anaphylactic reaction to yellow jacket sting.   DISCHARGE MEDICATIONS:  1. Atarax 25 mg 1 pill q.6h. As necessary for itch.  2. Prednisone taper as follows.  Take pill with food Tuesday and Wednesday     20 mg a.m. and p.m.; Thursday, Friday, Saturday 10 mg a.m. and p.m.;     Sunday, Monday, and Tuesday, 10 mg a.m.; Wednesday 5 mg; Thursday 5 mg;     Friday no medication; Saturday 5 mg, then stop.  3. Keflex 500 mg 1 p.o. t.i.d. for 3 more days, total days of antibiotics 5.  4. Epinephrine 10.  She is to return to her home medications which are:  1. Uniretic 25 mg 1 p.o. q.d.  2. Aspirin 2 tablets p.o. b.i.d.  3. Lipitor 20 mg a day.  4. Paxil 20 mg a day.  5. Aciphex.   LABORATORY DATA:  CBC:  WBC 19.0, hemoglobin 13.5, hematocrit 40.1.  BMET:  Sodium 133, potassium 3.9, glucose 132, BUN 5, creatinine 0.7.  Blood  cultures are pending.   HOSPITAL COURSE:  The patient was admitted via the ER after being stung on  the foot by a yellow jacket.  She felt that the foot was swollen.  She had  an abdominal rash with itch, and she felt chest tightness.  EMS gave her an  albuterol treatment and 100 mg Benadryl.  In the emergency room, she  received 125 mg Solu-Medrol, Benadryl, and epinephrine, also Pepcid 20 mg  IV.  The patient began to improve and was admitted for observation and  continued IV steroids. She as also provided with a tetanus shot at this time  since she could not recall her  previous immunization.  In addition, the  patient was initiated on Keflex 500 mg p.o. t.i.d. to hopefully avoid any  bacterial migration into her multiple prosthetics in her right leg.  The  patient reports that she had previously had some dental work, and there was  some bacterial seeding in her prosthetics which caused her great problems.  She will continue the antibiotics for a total of 5 days.   PHYSICAL EXAMINATION:  GENERAL:  At discharge, the patient is bright.  She  is awake, alert, and oriented, states that she currently has the same cold  that she had prior to admission, but other than that, she feels much  improved except for residual itching along  the dorsum of her left foot.  VITAL SIGNS:  Temperature 97.5, blood pressure 122/70, respirations 20.  HEART:  Regular rate and rhythm.  LUNGS:  No wheezing.  She has some cough with phlegm.  EXTREMITIES:  Her right foot has a small area which she continues to scratch  during this exam.  She has been instructed not to for fear of breaking skin  and providing entry for infection.   DISPOSITION:  Overall, the patient has recovered very well from her  anaphylactic reaction.  She will be placed on a prednisone taper and  provided with epinephrine and continued antibiotics.  She will be given  Atarax for control of her itching.  She is to return to her home  medications, and she is being discharged to home today in the care of her  family in improved condition.     Governor Specking, N.P. [facility]             Rene Paci, M.D. [facility]    TJ/MEDQ  D:  07/14/2002  T:  07/14/2002  Job:  [id number]   cc:   Gordy Savers, M.D. [facility]  [address] [place]  [place] [address]  Fax: 1   Myrtie Neither, M.D.  [address] [place]  [place] [address]  Fax: [phone]

## 2011-01-12 NOTE — Op Note (Signed)
NAMETRISTIN, GLADMAN                ACCOUNT NO.:  [id number]   MEDICAL RECORD NO.:  [id number]          PATIENT TYPE:  AMB   LOCATION:  NESC                         FACILITY:  [facility]   PHYSICIAN:  Rozanna Boer., M.D.DATE OF BIRTH:  August 28, 1940   DATE OF PROCEDURE:  08/11/2004  DATE OF DISCHARGE:                                 OPERATIVE REPORT   PREOPERATIVE DIAGNOSIS:  Recurrent bladder tumors, anterior dome, possibly  right posterior base.   POSTOPERATIVE DIAGNOSIS:  Recurrent bladder tumors, anterior dome, possibly  right posterior base.   OPERATION:  TURBT (small).   ANESTHESIA:  General.   SURGEON:  Courtney Paris, M.D.   BRIEF HISTORY:  This 70 year old lady has had recurrent bladder tumors.  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  She had negative cysto in June and again in September but on  recent cysto, had recurrent tumors on the anterior dome and possibly one on  the right base.  She still smokes and enters now for TURBT.   The patient was placed on the operating table in dorsal lithotomy position.  After satisfactory induction of general anesthesia, was prepped and draped  with Betadine in the usual sterile fashion.  An endoscope was inserted and  the bladder carefully inspected using the 70-degree lens.  The tumors of the  dome were photographed.  She had some hyperemia and erythema on the  posterior base which was also biopsied.  The tumors on the anterior dome  which numbered about 4 in all, were all small, and could be completely  removed by the cold cup biopsy forceps.  There was very little bleeding, and  the base was fulgurated with a Bugbee electrode and because of her thin  bladder wall, I left a Foley catheter 16 [place] which she will take out in 3  days' time.  She was sent home on Macrodantin and hydrocodone for pain.  She  will come back in the office in a week for follow up, sent to  recovery room  in good condition.      HMK/MEDQ  D:  08/11/2004  T:  08/11/2004  Job:  [id number]

## 2011-03-29 ENCOUNTER — Encounter: Payer: Self-pay | Admitting: Gastroenterology

## 2011-05-30 LAB — COMPREHENSIVE METABOLIC PANEL
Albumin: 3.6
BUN: 6
Creatinine, Ser: 1.08
GFR calc Af Amer: >60
Potassium: 3.7
Total Protein: 6.1

## 2011-05-30 LAB — DIFFERENTIAL
Lymphocytes Relative: 32
Monocytes Absolute: 0.4
Monocytes Relative: 7
Neutro Abs: 3.4

## 2011-05-30 LAB — CBC
HCT: 40.2
MCV: 82.7
Platelets: 297
RDW: 14.5

## 2011-05-30 LAB — POCT CARDIAC MARKERS: Troponin i, poc: <0.05

## 2011-08-04 ENCOUNTER — Other Ambulatory Visit (HOSPITAL_COMMUNITY): Payer: Self-pay

## 2013-02-19 ENCOUNTER — Ambulatory Visit (INDEPENDENT_AMBULATORY_CARE_PROVIDER_SITE_OTHER): Payer: Medicare Other | Admitting: Otolaryngology

## 2013-07-07 ENCOUNTER — Other Ambulatory Visit (HOSPITAL_COMMUNITY): Payer: Self-pay | Admitting: Pulmonary Disease

## 2013-07-07 DIAGNOSIS — Z139 Encounter for screening, unspecified: Secondary | ICD-10-CM

## 2013-07-13 ENCOUNTER — Ambulatory Visit (HOSPITAL_COMMUNITY)
Admission: RE | Admit: 2013-07-13 | Discharge: 2013-07-13 | Disposition: A | Discharge status: Home / Self Care | Payer: Medicare Other | Source: Ambulatory Visit | Attending: Pulmonary Disease | Admitting: Pulmonary Disease

## 2013-07-13 DIAGNOSIS — Z1231 Encounter for screening mammogram for malignant neoplasm of breast: Secondary | ICD-10-CM | POA: Insufficient documentation

## 2013-07-13 DIAGNOSIS — Z139 Encounter for screening, unspecified: Secondary | ICD-10-CM

## 2014-09-30 ENCOUNTER — Inpatient Hospital Stay (HOSPITAL_COMMUNITY)
Admission: EM | Admit: 2014-09-30 | Discharge: 2014-10-11 | DRG: 481 | Disposition: A | Discharge status: Skilled Nursing Facility | Payer: Commercial Managed Care - HMO | Attending: Pulmonary Disease | Admitting: Pulmonary Disease

## 2014-09-30 ENCOUNTER — Emergency Department (HOSPITAL_COMMUNITY): Payer: Commercial Managed Care - HMO

## 2014-09-30 DIAGNOSIS — Y92009 Unspecified place in unspecified non-institutional (private) residence as the place of occurrence of the external cause: Secondary | ICD-10-CM

## 2014-09-30 DIAGNOSIS — F411 Generalized anxiety disorder: Secondary | ICD-10-CM | POA: Diagnosis present

## 2014-09-30 DIAGNOSIS — I10 Essential (primary) hypertension: Secondary | ICD-10-CM | POA: Diagnosis present

## 2014-09-30 DIAGNOSIS — W19XXXA Unspecified fall, initial encounter: Secondary | ICD-10-CM | POA: Diagnosis present

## 2014-09-30 DIAGNOSIS — S72001D Fracture of unspecified part of neck of right femur, subsequent encounter for closed fracture with routine healing: Secondary | ICD-10-CM | POA: Diagnosis not present

## 2014-09-30 DIAGNOSIS — M6281 Muscle weakness (generalized): Secondary | ICD-10-CM | POA: Diagnosis not present

## 2014-09-30 DIAGNOSIS — S72141A Displaced intertrochanteric fracture of right femur, initial encounter for closed fracture: Principal | ICD-10-CM | POA: Diagnosis present

## 2014-09-30 DIAGNOSIS — F0390 Unspecified dementia without behavioral disturbance: Secondary | ICD-10-CM | POA: Diagnosis present

## 2014-09-30 DIAGNOSIS — M1990 Unspecified osteoarthritis, unspecified site: Secondary | ICD-10-CM | POA: Diagnosis present

## 2014-09-30 DIAGNOSIS — S299XXA Unspecified injury of thorax, initial encounter: Secondary | ICD-10-CM | POA: Diagnosis not present

## 2014-09-30 DIAGNOSIS — M25551 Pain in right hip: Secondary | ICD-10-CM | POA: Diagnosis present

## 2014-09-30 DIAGNOSIS — E785 Hyperlipidemia, unspecified: Secondary | ICD-10-CM | POA: Diagnosis present

## 2014-09-30 DIAGNOSIS — F329 Major depressive disorder, single episode, unspecified: Secondary | ICD-10-CM | POA: Diagnosis not present

## 2014-09-30 DIAGNOSIS — R278 Other lack of coordination: Secondary | ICD-10-CM | POA: Diagnosis not present

## 2014-09-30 DIAGNOSIS — Z8673 Personal history of transient ischemic attack (TIA), and cerebral infarction without residual deficits: Secondary | ICD-10-CM

## 2014-09-30 DIAGNOSIS — A0472 Enterocolitis due to Clostridium difficile, not specified as recurrent: Secondary | ICD-10-CM | POA: Diagnosis present

## 2014-09-30 DIAGNOSIS — S72001A Fracture of unspecified part of neck of right femur, initial encounter for closed fracture: Secondary | ICD-10-CM | POA: Diagnosis present

## 2014-09-30 DIAGNOSIS — Z87891 Personal history of nicotine dependence: Secondary | ICD-10-CM

## 2014-09-30 DIAGNOSIS — A047 Enterocolitis due to Clostridium difficile: Secondary | ICD-10-CM | POA: Diagnosis present

## 2014-09-30 DIAGNOSIS — I481 Persistent atrial fibrillation: Secondary | ICD-10-CM | POA: Diagnosis present

## 2014-09-30 DIAGNOSIS — Z823 Family history of stroke: Secondary | ICD-10-CM | POA: Diagnosis not present

## 2014-09-30 DIAGNOSIS — I4891 Unspecified atrial fibrillation: Secondary | ICD-10-CM | POA: Diagnosis not present

## 2014-09-30 DIAGNOSIS — Y92000 Kitchen of unspecified non-institutional (private) residence as  the place of occurrence of the external cause: Secondary | ICD-10-CM

## 2014-09-30 DIAGNOSIS — S72009A Fracture of unspecified part of neck of unspecified femur, initial encounter for closed fracture: Secondary | ICD-10-CM | POA: Diagnosis not present

## 2014-09-30 DIAGNOSIS — R262 Difficulty in walking, not elsewhere classified: Secondary | ICD-10-CM | POA: Diagnosis not present

## 2014-09-30 DIAGNOSIS — I480 Paroxysmal atrial fibrillation: Secondary | ICD-10-CM | POA: Diagnosis not present

## 2014-09-30 DIAGNOSIS — R52 Pain, unspecified: Secondary | ICD-10-CM | POA: Diagnosis not present

## 2014-09-30 HISTORY — DX: Essential (primary) hypertension: I10

## 2014-09-30 HISTORY — DX: Malignant (primary) neoplasm, unspecified: C80.1

## 2014-09-30 HISTORY — DX: Cerebral infarction, unspecified: I63.9

## 2014-09-30 HISTORY — DX: Anxiety disorder, unspecified: F41.9

## 2014-09-30 HISTORY — DX: Unspecified osteoarthritis, unspecified site: M19.90

## 2014-09-30 LAB — CBC WITH DIFFERENTIAL/PLATELET
BASOS ABS: 0 10*3/uL (ref 0.0–0.1)
Basophils Relative: 0 % (ref 0–1)
Eosinophils Absolute: 0.1 10*3/uL (ref 0.0–0.7)
Eosinophils Relative: 2 % (ref 0–5)
HCT: 40.4 % (ref 36.0–46.0)
Hemoglobin: 12.6 g/dL (ref 12.0–15.0)
Lymphocytes Relative: 21 % (ref 12–46)
Lymphs Abs: 1.5 10*3/uL (ref 0.7–4.0)
MCH: 27.6 pg (ref 26.0–34.0)
MCHC: 31.2 g/dL (ref 30.0–36.0)
MCV: 88.6 fL (ref 78.0–100.0)
MONO ABS: 0.5 10*3/uL (ref 0.1–1.0)
MONOS PCT: 7 % (ref 3–12)
NEUTROS ABS: 5.1 10*3/uL (ref 1.7–7.7)
Neutrophils Relative %: 70 % (ref 43–77)
PLATELETS: 282 10*3/uL (ref 150–400)
RBC: 4.56 MIL/uL (ref 3.87–5.11)
RDW: 14.7 % (ref 11.5–15.5)
WBC: 7.3 10*3/uL (ref 4.0–10.5)

## 2014-09-30 LAB — APTT: APTT: 26 s (ref 24–37)

## 2014-09-30 LAB — PROTIME-INR
INR: 1.08 (ref 0.00–1.49)
PROTHROMBIN TIME: 14.1 s (ref 11.6–15.2)

## 2014-09-30 MED ORDER — FENTANYL CITRATE 0.05 MG/ML IJ SOLN
50.0000 ug | Freq: Once | INTRAMUSCULAR | Status: AC
  Administered 2014-09-30: 50 ug via INTRAVENOUS
  Filled 2014-09-30: qty 2

## 2014-09-30 NOTE — ED Provider Notes (Signed)
CSN: [id number]     Arrival date & time 09/30/14  2258 History  This chart was scribed for Tami Norrie, MD by Edison Simon, ED Scribe. This patient was seen in room IC03/IC03-01 and the patient's care was started at 11:08 PM.    Chief Complaint  Patient presents with  . Hip Injury   The history is provided by the patient. No language interpreter was used.    HPI Comments: Tami [name] is a 74 y.o. female who presents to the Emergency Department complaining of right hip pain status post fall PTA. She states she was turning around quickly when she lost her footing and fell. She denies striking her head or LOC. She denies pain elsewhere. She notes that she feels thirsty. She states she used 1 dose of Vicodin before coming here without improvement and states EMS did not administer any medication. She states she is a former smoker and her husband is a former smoker, both quit approximately 10 years ago. She states she uses alcohol rarely. She states she has a walker and a cane and normally walks with a stick when she is indoors. She reports prior knee replacement by Dr. Eulas Post. She denies history of breathing problems and states she is not prescribed an inhaler or nebulizer. She denies history of cardiac problems or atrial fibrillation. She states she is on a small amount of a blood thinner which she cannot name. She denies dizziness, lightheadedness, chest pain, SOB, or leg swelling.   PCP: Alonza Bogus, MD  Orthopedist: Dr. Eulas Post (retired) and Dr. Gladstone Lighter at [facility]  Past Medical History  Diagnosis Date  . Arthritis   . Cancer Cervical  . Hypertension   . Stroke   . Anxiety    Past Surgical History  Procedure Laterality Date  . Knee arthroscopy Right   . Femur closed reduction Right   . Appendectomy    . Cholecystectomy    . Brain surgery    . Tonsillectomy     No family history on file. History  Substance Use Topics  . Smoking status: Former Research scientist (life sciences)  . Smokeless  tobacco: Never Used  . Alcohol Use: No  lives at home Lives with spouse Uses a cane and walker Quit smoking 10 years ago   OB History    No data available     Review of Systems  Respiratory: Negative for chest tightness and shortness of breath.   Cardiovascular: Negative for leg swelling.  Musculoskeletal: Negative for back pain and neck pain.       Right hip pain  Neurological: Negative for dizziness and light-headedness.  Hematological: Bruises/bleeds easily.  All other systems reviewed and are negative.     Allergies  Penicillins  Home Medications   Prior to Admission medications   Medication Sig Start Date End Date Taking? Authorizing Provider  benazepril (LOTENSIN) 20 MG tablet Take 20 mg by mouth daily.   Yes Historical Provider, MD  clopidogrel (PLAVIX) 75 MG tablet Take 75 mg by mouth daily.   Yes Historical Provider, MD  donepezil (ARICEPT) 10 MG tablet Take 20 mg by mouth at bedtime.   Yes Historical Provider, MD  sertraline (ZOLOFT) 100 MG tablet Take by mouth daily.   Yes Historical Provider, MD  simvastatin (ZOCOR) 40 MG tablet Take 40 mg by mouth daily.   Yes Historical Provider, MD    ED Triage Vitals  Enc Vitals Group     BP 09/30/14 2314 130/89 mmHg  Pulse Rate 09/30/14 2314 132     Resp 09/30/14 2314 16     Temp 09/30/14 2314 97.8 F (36.6 C)     Temp Source 09/30/14 2314 Oral     SpO2 09/30/14 2314 86 %     Weight 09/30/14 2314 168 lb (76.204 kg)     Height 09/30/14 2314 5\' 10"  (1.778 m)     Head Cir --      Peak Flow --      Pain Score 09/30/14 2303 10     Pain Loc --      Pain Edu? --      Excl. in [place]? --    Vital signs normal except tachycardia  Physical Exam  Constitutional: She is oriented to person, place, and time. She appears well-developed and well-nourished.  Non-toxic appearance. She does not appear ill. No distress.  HENT:  Head: Normocephalic and atraumatic.  Right Ear: External ear normal.  Left Ear: External ear  normal.  Nose: Nose normal. No mucosal edema or rhinorrhea.  Mouth/Throat: Oropharynx is clear and moist and mucous membranes are normal. No dental abscesses or uvula swelling.  Eyes: Conjunctivae and EOM are normal. Pupils are equal, round, and reactive to light.  Neck: Normal range of motion and full passive range of motion without pain. Neck supple.  Cardiovascular: Normal heart sounds.  An irregularly irregular rhythm present. Tachycardia present.  Exam reveals no gallop and no friction rub.   No murmur heard. Pulmonary/Chest: Effort normal. No respiratory distress. She has wheezes. She has no rhonchi. She has no rales. She exhibits no tenderness and no crepitus.  Appears SOB and is having audible wheezing  Abdominal: Soft. Normal appearance and bowel sounds are normal. She exhibits no distension. There is no tenderness. There is no rebound and no guarding.  Musculoskeletal: Normal range of motion. She exhibits no edema or tenderness.  RLE is shortened and internally rotated, pt indicates she has pain in her right hip  Neurological: She is alert and oriented to person, place, and time. She has normal strength. No cranial nerve deficit.  Skin: Skin is warm, dry and intact. No rash noted. No erythema. There is pallor.  Psychiatric: She has a normal mood and affect. Her speech is normal and behavior is normal. Her mood appears not anxious.  Nursing note and vitals reviewed.   ED Course  Procedures (including critical care time)  DIAGNOSTIC STUDIES: Oxygen Saturation is 91% on nasal canula, adequate by my interpretation.    COORDINATION OF CARE: 11:18 PM Discussed with patient that I suspect her right hip may be fractured. Discussed treatment plan with patient at beside, the patient agrees with the plan and has no further questions at this time. Patient denies any prior history of heart or lung problems. She is unaware for wheezing. She was not given a albuterol nebulizer because of her  atrial fibrillation. During the time of my exam her heart rate was around 100.  Patient's atrial fib now is going up almost to 150. She was started on a Cardizem bolus and drip. Patient is unaware of when her arrhythmia started.  01:22 Dr Shanon Brow states patient can stay here with her atrial fibrillation, concerned about ortho being available all weekend. Pt and husband prefer to stay here rather than be transferred.   01:30 Dr Luna Glasgow, will see patient in the am.   Labs Review Results for orders placed or performed during the hospital encounter of 09/30/14  MRSA PCR Screening  Result  Value Ref Range   MRSA by PCR NEGATIVE NEGATIVE  Comprehensive metabolic panel  Result Value Ref Range   Sodium 139 135 - 145 mmol/L   Potassium 4.3 3.5 - 5.1 mmol/L   Chloride 105 96 - 112 mmol/L   CO2 30 19 - 32 mmol/L   Glucose, Bld 109 (H) 70 - 99 mg/dL   BUN 15 6 - 23 mg/dL   Creatinine, Ser 1.01 0.50 - 1.10 mg/dL   Calcium 8.5 8.4 - 10.5 mg/dL   Total Protein 6.6 6.0 - 8.3 g/dL   Albumin 3.6 3.5 - 5.2 g/dL   AST 23 0 - 37 U/L   ALT 12 0 - 35 U/L   Alkaline Phosphatase 76 39 - 117 U/L   Total Bilirubin 0.5 0.3 - 1.2 mg/dL   GFR calc non Af Amer 53 (L) >90 mL/min   GFR calc Af Amer 62 (L) >90 mL/min   Anion gap 4 (L) 5 - 15  CBC with Differential  Result Value Ref Range   WBC 7.3 4.0 - 10.5 K/uL   RBC 4.56 3.87 - 5.11 MIL/uL   Hemoglobin 12.6 12.0 - 15.0 g/dL   HCT 40.4 36.0 - 46.0 %   MCV 88.6 78.0 - 100.0 fL   MCH 27.6 26.0 - 34.0 pg   MCHC 31.2 30.0 - 36.0 g/dL   RDW 14.7 11.5 - 15.5 %   Platelets 282 150 - 400 K/uL   Neutrophils Relative % 70 43 - 77 %   Neutro Abs 5.1 1.7 - 7.7 K/uL   Lymphocytes Relative 21 12 - 46 %   Lymphs Abs 1.5 0.7 - 4.0 K/uL   Monocytes Relative 7 3 - 12 %   Monocytes Absolute 0.5 0.1 - 1.0 K/uL   Eosinophils Relative 2 0 - 5 %   Eosinophils Absolute 0.1 0.0 - 0.7 K/uL   Basophils Relative 0 0 - 1 %   Basophils Absolute 0.0 0.0 - 0.1 K/uL  Protime-INR   Result Value Ref Range   Prothrombin Time 14.1 11.6 - 15.2 seconds   INR 1.08 0.00 - 1.49  Urinalysis, Routine w reflex microscopic  Result Value Ref Range   Color, Urine YELLOW YELLOW   APPearance CLEAR CLEAR   Specific Gravity, Urine 1.020 1.005 - 1.030   pH 5.5 5.0 - 8.0   Glucose, UA NEGATIVE NEGATIVE mg/dL   Hgb urine dipstick NEGATIVE NEGATIVE   Bilirubin Urine NEGATIVE NEGATIVE   Ketones, ur NEGATIVE NEGATIVE mg/dL   Protein, ur NEGATIVE NEGATIVE mg/dL   Urobilinogen, UA 0.2 0.0 - 1.0 mg/dL   Nitrite NEGATIVE NEGATIVE   Leukocytes, UA NEGATIVE NEGATIVE  APTT  Result Value Ref Range   aPTT 26 24 - 37 seconds  Brain natriuretic peptide  Result Value Ref Range   B Natriuretic Peptide 348.0 (H) 0.0 - 100.0 pg/mL  Troponin I  Result Value Ref Range   Troponin I <0.03 <0.031 ng/mL  Magnesium  Result Value Ref Range   Magnesium 1.9 1.5 - 2.5 mg/dL  Protime-INR  Result Value Ref Range   Prothrombin Time 14.3 11.6 - 15.2 seconds   INR 1.10 0.00 - 1.49  CBC  Result Value Ref Range   WBC 10.2 4.0 - 10.5 K/uL   RBC 4.43 3.87 - 5.11 MIL/uL   Hemoglobin 11.9 (L) 12.0 - 15.0 g/dL   HCT 39.4 36.0 - 46.0 %   MCV 88.9 78.0 - 100.0 fL   MCH 26.9 26.0 - 34.0 pg  MCHC 30.2 30.0 - 36.0 g/dL   RDW 14.6 11.5 - 15.5 %   Platelets 267 150 - 400 K/uL  Basic metabolic panel  Result Value Ref Range   Sodium 141 135 - 145 mmol/L   Potassium 4.7 3.5 - 5.1 mmol/L   Chloride 107 96 - 112 mmol/L   CO2 29 19 - 32 mmol/L   Glucose, Bld 105 (H) 70 - 99 mg/dL   BUN 16 6 - 23 mg/dL   Creatinine, Ser 1.11 (H) 0.50 - 1.10 mg/dL   Calcium 8.3 (L) 8.4 - 10.5 mg/dL   GFR calc non Af Amer 47 (L) >90 mL/min   GFR calc Af Amer 55 (L) >90 mL/min   Anion gap 5 5 - 15  TSH  Result Value Ref Range   TSH 2.928 0.350 - 4.500 uIU/mL  Troponin I (q 6hr x 3)  Result Value Ref Range   Troponin I <0.03 <0.031 ng/mL  Lactic acid, plasma  Result Value Ref Range   Lactic Acid, Venous 0.7 0.5 - 2.0  mmol/L  Sample to Blood Bank  Result Value Ref Range   Blood Bank Specimen SAMPLE AVAILABLE FOR TESTING    Sample Expiration 10/03/2014    Laboratory interpretation all normal except mildly elevated BNP   Imaging Review Dg Chest 1 View  10/01/2014   CLINICAL DATA:  Status post fall. Concern for chest injury. Initial encounter.  EXAM: CHEST  1 VIEW  COMPARISON:  Chest radiograph from 08/09/2004, and CT of the chest performed 08/15/2004  FINDINGS: The lungs are well-aerated. Chronically increased interstitial markings are seen. Peribronchial thickening is noted. There is no evidence of pleural effusion or pneumothorax.  The cardiomediastinal silhouette is borderline enlarged. No acute osseous abnormalities are seen.  IMPRESSION: Chronically increased interstitial markings seen. Peribronchial thickening noted. Borderline cardiomegaly.   Electronically Signed   By: Garald Balding M.D.   On: 10/01/2014 00:38   Dg Hip Unilat With Pelvis 2-3 Views Right  10/01/2014   CLINICAL DATA:  Acute onset of severe right hip pain, status post fall. Initial encounter.  EXAM: RIGHT HIP (WITH PELVIS) 2-3 VIEWS  COMPARISON:  None.  FINDINGS: There is a comminuted intertrochanteric fracture through the proximal right femur, with displaced comminuted lesser trochanteric fragments. There is mild lateral and distal displacement of the distal femur. The right femoral head remains seated at the acetabulum. The left hip joint is grossly unremarkable. The cross-table lateral views are markedly suboptimal due to the patient's habitus.  Mild degenerative change is noted at the lower lumbar spine. The sacroiliac joints are grossly unremarkable in appearance. The visualized bowel gas pattern is grossly unremarkable.  IMPRESSION: Comminuted intertrochanteric fracture through the proximal right femur, with displaced comminuted lesser trochanteric fragments. Mild lateral and distal displacement of the distal femur.   Electronically Signed    By: Garald Balding M.D.   On: 10/01/2014 00:41     EKG Interpretation   Date/Time:  Thursday September 30 2014 23:12:31 EST Ventricular Rate:  124 PR Interval:    QRS Duration: 77 QT Interval:  301 QTC Calculation: 432 R Axis:   80 Text Interpretation:  Atrial fibrillation Baseline wander in lead(s) V6  Since last tracing 05 May 2008 Atrial fibrillation has replaced Normal  sinus rhythm Confirmed by [place]  MD-I, Lacresia Darwish ([id number]) on 10/01/2014 12:38:40 AM      MDM   Final diagnoses:  Fall at home, initial encounter  Atrial fibrillation with RVR  Intertrochanteric fracture of right hip,  closed, initial encounter    I personally performed the services described in this documentation, which was scribed in my presence. The recorded information has been reviewed and considered.  Rolland Porter, MD, FACEP   CRITICAL CARE Performed by: Tami [name] Total critical care time: 33 min Critical care time was exclusive of separately billable procedures and treating other patients. Critical care was necessary to treat or prevent imminent or life-threatening deterioration. Critical care was time spent personally by me on the following activities: development of treatment plan with patient and/or surrogate as well as nursing, discussions with consultants, evaluation of patient's response to treatment, examination of patient, obtaining history from patient or surrogate, ordering and performing treatments and interventions, ordering and review of laboratory studies, ordering and review of radiographic studies, pulse oximetry and re-evaluation of patient's condition.   Tami Norrie, MD 10/01/14 [phone]

## 2014-09-30 NOTE — ED Notes (Addendum)
Pt states she turned around quickly and lost her footing, states she fell injuring her right hip.   Per ems, pt has possible hip fracture

## 2014-10-01 ENCOUNTER — Encounter (HOSPITAL_COMMUNITY): Payer: Self-pay | Admitting: Emergency Medicine

## 2014-10-01 DIAGNOSIS — I481 Persistent atrial fibrillation: Secondary | ICD-10-CM | POA: Diagnosis present

## 2014-10-01 DIAGNOSIS — W19XXXS Unspecified fall, sequela: Secondary | ICD-10-CM

## 2014-10-01 DIAGNOSIS — W19XXXA Unspecified fall, initial encounter: Secondary | ICD-10-CM

## 2014-10-01 DIAGNOSIS — M25551 Pain in right hip: Secondary | ICD-10-CM | POA: Diagnosis present

## 2014-10-01 DIAGNOSIS — I10 Essential (primary) hypertension: Secondary | ICD-10-CM

## 2014-10-01 DIAGNOSIS — I4891 Unspecified atrial fibrillation: Secondary | ICD-10-CM | POA: Diagnosis not present

## 2014-10-01 DIAGNOSIS — E785 Hyperlipidemia, unspecified: Secondary | ICD-10-CM | POA: Diagnosis present

## 2014-10-01 DIAGNOSIS — M1990 Unspecified osteoarthritis, unspecified site: Secondary | ICD-10-CM | POA: Diagnosis present

## 2014-10-01 DIAGNOSIS — Y92009 Unspecified place in unspecified non-institutional (private) residence as the place of occurrence of the external cause: Secondary | ICD-10-CM

## 2014-10-01 DIAGNOSIS — A047 Enterocolitis due to Clostridium difficile: Secondary | ICD-10-CM | POA: Diagnosis present

## 2014-10-01 DIAGNOSIS — F0390 Unspecified dementia without behavioral disturbance: Secondary | ICD-10-CM | POA: Diagnosis present

## 2014-10-01 DIAGNOSIS — Z823 Family history of stroke: Secondary | ICD-10-CM | POA: Diagnosis not present

## 2014-10-01 DIAGNOSIS — Z87891 Personal history of nicotine dependence: Secondary | ICD-10-CM | POA: Diagnosis not present

## 2014-10-01 DIAGNOSIS — S72001A Fracture of unspecified part of neck of right femur, initial encounter for closed fracture: Secondary | ICD-10-CM | POA: Diagnosis present

## 2014-10-01 DIAGNOSIS — F411 Generalized anxiety disorder: Secondary | ICD-10-CM

## 2014-10-01 DIAGNOSIS — Y92000 Kitchen of unspecified non-institutional (private) residence as  the place of occurrence of the external cause: Secondary | ICD-10-CM | POA: Diagnosis not present

## 2014-10-01 DIAGNOSIS — S72141A Displaced intertrochanteric fracture of right femur, initial encounter for closed fracture: Secondary | ICD-10-CM | POA: Diagnosis present

## 2014-10-01 DIAGNOSIS — Z8673 Personal history of transient ischemic attack (TIA), and cerebral infarction without residual deficits: Secondary | ICD-10-CM | POA: Diagnosis not present

## 2014-10-01 LAB — BASIC METABOLIC PANEL
ANION GAP: 5 (ref 5–15)
BUN: 16 mg/dL (ref 6–23)
CO2: 29 mmol/L (ref 19–32)
CREATININE: 1.11 mg/dL — AB (ref 0.50–1.10)
Calcium: 8.3 mg/dL — ABNORMAL LOW (ref 8.4–10.5)
Chloride: 107 mmol/L (ref 96–112)
GFR calc non Af Amer: 47 mL/min — ABNORMAL LOW (ref >90)
GFR, EST AFRICAN AMERICAN: 55 mL/min — AB (ref >90)
Glucose, Bld: 105 mg/dL — ABNORMAL HIGH (ref 70–99)
Potassium: 4.7 mmol/L (ref 3.5–5.1)
Sodium: 141 mmol/L (ref 135–145)

## 2014-10-01 LAB — CBC
HEMATOCRIT: 39.4 % (ref 36.0–46.0)
Hemoglobin: 11.9 g/dL — ABNORMAL LOW (ref 12.0–15.0)
MCH: 26.9 pg (ref 26.0–34.0)
MCHC: 30.2 g/dL (ref 30.0–36.0)
MCV: 88.9 fL (ref 78.0–100.0)
Platelets: 267 10*3/uL (ref 150–400)
RBC: 4.43 MIL/uL (ref 3.87–5.11)
RDW: 14.6 % (ref 11.5–15.5)
WBC: 10.2 10*3/uL (ref 4.0–10.5)

## 2014-10-01 LAB — COMPREHENSIVE METABOLIC PANEL
ALBUMIN: 3.6 g/dL (ref 3.5–5.2)
ALT: 12 U/L (ref 0–35)
ANION GAP: 4 — AB (ref 5–15)
AST: 23 U/L (ref 0–37)
Alkaline Phosphatase: 76 U/L (ref 39–117)
BUN: 15 mg/dL (ref 6–23)
CHLORIDE: 105 mmol/L (ref 96–112)
CO2: 30 mmol/L (ref 19–32)
Calcium: 8.5 mg/dL (ref 8.4–10.5)
Creatinine, Ser: 1.01 mg/dL (ref 0.50–1.10)
GFR calc Af Amer: 62 mL/min — ABNORMAL LOW (ref >90)
GFR, EST NON AFRICAN AMERICAN: 53 mL/min — AB (ref >90)
Glucose, Bld: 109 mg/dL — ABNORMAL HIGH (ref 70–99)
Potassium: 4.3 mmol/L (ref 3.5–5.1)
Sodium: 139 mmol/L (ref 135–145)
TOTAL PROTEIN: 6.6 g/dL (ref 6.0–8.3)
Total Bilirubin: 0.5 mg/dL (ref 0.3–1.2)

## 2014-10-01 LAB — URINALYSIS, ROUTINE W REFLEX MICROSCOPIC
BILIRUBIN URINE: NEGATIVE
Glucose, UA: NEGATIVE mg/dL
Hgb urine dipstick: NEGATIVE
KETONES UR: NEGATIVE mg/dL
LEUKOCYTES UA: NEGATIVE
Nitrite: NEGATIVE
PROTEIN: NEGATIVE mg/dL
Specific Gravity, Urine: 1.02 (ref 1.005–1.030)
Urobilinogen, UA: 0.2 mg/dL (ref 0.0–1.0)
pH: 5.5 (ref 5.0–8.0)

## 2014-10-01 LAB — BRAIN NATRIURETIC PEPTIDE: B NATRIURETIC PEPTIDE 5: 348 pg/mL — AB (ref 0.0–100.0)

## 2014-10-01 LAB — PROTIME-INR
INR: 1.1 (ref 0.00–1.49)
Prothrombin Time: 14.3 seconds (ref 11.6–15.2)

## 2014-10-01 LAB — TROPONIN I
Troponin I: <0.03 ng/mL (ref <0.031)
Troponin I: <0.03 ng/mL (ref <0.031)

## 2014-10-01 LAB — LACTIC ACID, PLASMA: LACTIC ACID, VENOUS: 0.7 mmol/L (ref 0.5–2.0)

## 2014-10-01 LAB — HEPARIN LEVEL (UNFRACTIONATED): Heparin Unfractionated: 0.27 IU/mL — ABNORMAL LOW (ref 0.30–0.70)

## 2014-10-01 LAB — SAMPLE TO BLOOD BANK

## 2014-10-01 LAB — MAGNESIUM: MAGNESIUM: 1.9 mg/dL (ref 1.5–2.5)

## 2014-10-01 LAB — MRSA PCR SCREENING: MRSA BY PCR: NEGATIVE

## 2014-10-01 LAB — TSH: TSH: 2.928 u[IU]/mL (ref 0.350–4.500)

## 2014-10-01 MED ORDER — OXYCODONE-ACETAMINOPHEN 5-325 MG PO TABS
1.0000 | ORAL_TABLET | Freq: Once | ORAL | Status: AC
  Administered 2014-10-01: 1 via ORAL
  Filled 2014-10-01: qty 1

## 2014-10-01 MED ORDER — FENTANYL CITRATE 0.05 MG/ML IJ SOLN
50.0000 ug | Freq: Once | INTRAMUSCULAR | Status: AC
  Administered 2014-10-01: 50 ug via INTRAVENOUS
  Filled 2014-10-01: qty 2

## 2014-10-01 MED ORDER — AMIODARONE IV BOLUS ONLY 150 MG/100ML
150.0000 mg | Freq: Once | INTRAVENOUS | Status: AC
  Administered 2014-10-01: 150 mg via INTRAVENOUS
  Filled 2014-10-01: qty 100

## 2014-10-01 MED ORDER — METOPROLOL TARTRATE 25 MG PO TABS
25.0000 mg | ORAL_TABLET | Freq: Two times a day (BID) | ORAL | Status: DC
Start: 2014-10-01 — End: 2014-10-01

## 2014-10-01 MED ORDER — HEPARIN (PORCINE) IN NACL 100-0.45 UNIT/ML-% IJ SOLN
1400.0000 [IU]/h | INTRAMUSCULAR | Status: DC
  Administered 2014-10-01: 1050 [IU]/h via INTRAVENOUS
  Administered 2014-10-02: 1200 [IU]/h via INTRAVENOUS
  Administered 2014-10-02 – 2014-10-07 (×7): 1400 [IU]/h via INTRAVENOUS
  Filled 2014-10-01 (×9): qty 250

## 2014-10-01 MED ORDER — DIGOXIN 125 MCG PO TABS
0.1250 mg | ORAL_TABLET | Freq: Every day | ORAL | Status: DC
  Administered 2014-10-02 – 2014-10-11 (×10): 0.125 mg via ORAL
  Filled 2014-10-01 (×10): qty 1

## 2014-10-01 MED ORDER — DILTIAZEM HCL 100 MG IV SOLR
5.0000 mg/h | Freq: Once | INTRAVENOUS | Status: AC
  Administered 2014-10-01: 5 mg/h via INTRAVENOUS
  Filled 2014-10-01: qty 100

## 2014-10-01 MED ORDER — METHOCARBAMOL 1000 MG/10ML IJ SOLN
500.0000 mg | Freq: Four times a day (QID) | INTRAVENOUS | Status: DC | PRN
  Filled 2014-10-01: qty 5

## 2014-10-01 MED ORDER — DIGOXIN 0.25 MG/ML IJ SOLN
0.2500 mg | Freq: Once | INTRAMUSCULAR | Status: AC
Start: 2014-10-01 — End: 2014-10-01
  Administered 2014-10-01: 0.25 mg via INTRAVENOUS
  Filled 2014-10-01: qty 2

## 2014-10-01 MED ORDER — HYDROCODONE-ACETAMINOPHEN 5-325 MG PO TABS
1.0000 | ORAL_TABLET | Freq: Four times a day (QID) | ORAL | Status: DC | PRN
  Administered 2014-10-01 (×2): 1 via ORAL
  Administered 2014-10-01 – 2014-10-04 (×7): 2 via ORAL
  Administered 2014-10-04: 1 via ORAL
  Administered 2014-10-05: 2 via ORAL
  Administered 2014-10-05: 1 via ORAL
  Administered 2014-10-05 – 2014-10-07 (×5): 2 via ORAL
  Administered 2014-10-07: 1 via ORAL
  Administered 2014-10-07 – 2014-10-11 (×13): 2 via ORAL
  Filled 2014-10-01 (×6): qty 2
  Filled 2014-10-01 (×2): qty 1
  Filled 2014-10-01 (×13): qty 2
  Filled 2014-10-01: qty 1
  Filled 2014-10-01: qty 2
  Filled 2014-10-01: qty 1
  Filled 2014-10-01 (×7): qty 2

## 2014-10-01 MED ORDER — AMIODARONE HCL 150 MG/3ML IV SOLN: 150.0000 mg | Freq: Once | INTRAVENOUS | Status: DC

## 2014-10-01 MED ORDER — SODIUM CHLORIDE 0.9 % IV BOLUS (SEPSIS)
1000.0000 mL | Freq: Once | INTRAVENOUS | Status: AC
  Administered 2014-10-01: 1000 mL via INTRAVENOUS

## 2014-10-01 MED ORDER — SODIUM CHLORIDE 0.9 % IV BOLUS (SEPSIS)
500.0000 mL | Freq: Once | INTRAVENOUS | Status: AC
  Administered 2014-10-01: 500 mL via INTRAVENOUS

## 2014-10-01 MED ORDER — DIGOXIN 0.25 MG/ML IJ SOLN
0.5000 mg | Freq: Once | INTRAMUSCULAR | Status: AC
  Administered 2014-10-01: 0.5 mg via INTRAVENOUS
  Filled 2014-10-01: qty 2

## 2014-10-01 MED ORDER — MORPHINE SULFATE 2 MG/ML IJ SOLN
1.0000 mg | INTRAMUSCULAR | Status: DC | PRN
  Administered 2014-10-01 – 2014-10-10 (×10): 1 mg via INTRAVENOUS
  Filled 2014-10-01 (×11): qty 1

## 2014-10-01 MED ORDER — DILTIAZEM HCL 25 MG/5ML IV SOLN
10.0000 mg | Freq: Once | INTRAVENOUS | Status: AC
  Administered 2014-10-01: 10 mg via INTRAVENOUS

## 2014-10-01 MED ORDER — INFLUENZA VAC SPLIT QUAD 0.5 ML IM SUSY
0.5000 mL | PREFILLED_SYRINGE | INTRAMUSCULAR | Status: AC
  Administered 2014-10-02: 0.5 mL via INTRAMUSCULAR
  Filled 2014-10-01: qty 0.5

## 2014-10-01 MED ORDER — HEPARIN BOLUS VIA INFUSION
4000.0000 [IU] | Freq: Once | INTRAVENOUS | Status: AC
  Administered 2014-10-01: 4000 [IU] via INTRAVENOUS
  Filled 2014-10-01: qty 4000

## 2014-10-01 MED ORDER — METHOCARBAMOL 500 MG PO TABS
500.0000 mg | ORAL_TABLET | Freq: Four times a day (QID) | ORAL | Status: DC | PRN
  Administered 2014-10-01 – 2014-10-09 (×9): 500 mg via ORAL
  Filled 2014-10-01 (×9): qty 1

## 2014-10-01 MED ORDER — ONDANSETRON HCL 4 MG/2ML IJ SOLN: 4.0000 mg | Freq: Three times a day (TID) | INTRAMUSCULAR | Status: DC | PRN

## 2014-10-01 MED ORDER — SODIUM CHLORIDE 0.9 % IV SOLN: INTRAVENOUS | Status: DC

## 2014-10-01 MED ORDER — FENTANYL CITRATE 0.05 MG/ML IJ SOLN: 50.0000 ug | INTRAMUSCULAR | Status: DC | PRN

## 2014-10-01 NOTE — Progress Notes (Addendum)
ANTICOAGULATION CONSULT NOTE - Initial Consult  Pharmacy Consult for Heparin Indication: atrial fibrillation  Allergies  Allergen Reactions  . Penicillins     Patient Measurements: Height: 5\' 9"  (175.3 cm) Weight: 201 lb 15.1 oz (91.6 kg) IBW/kg (Calculated) : 66.2 Heparin Dosing Weight: 85kg  Vital Signs: Temp: 97.9 F (36.6 C) (02/05 0743) Temp Source: Oral (02/05 0743) BP: 85/56 mmHg (02/05 0915) Pulse Rate: 37 (02/05 0915)  Labs:  Recent Labs  09/30/14 2332 10/01/14 0322 10/01/14 0323 10/01/14 0910  HGB 12.6  --  11.9*  --   HCT 40.4  --  39.4  --   PLT 282  --  267  --   APTT 26  --   --   --   LABPROT 14.1 14.3  --   --   INR 1.08 1.10  --   --   CREATININE 1.01  --  1.11*  --   TROPONINI <0.03 <0.03  --  <0.03    Estimated Creatinine Clearance: 52.8 mL/min (by C-G formula based on Cr of 1.11).   Medical History: Past Medical History  Diagnosis Date  . Arthritis   . Cancer Cervical  . Hypertension   . Stroke   . Anxiety     Medications:  Scheduled:  . digoxin  0.25 mg Intravenous Once   Followed by  . [START ON 10/02/2014] digoxin  0.125 mg Oral Daily  . [START ON 10/02/2014] Influenza vac split quadrivalent PF  0.5 mL Intramuscular Tomorrow-1000  . metoprolol tartrate  25 mg Oral BID    Assessment: 53 yoF admitted with hip fx found to be in Afib with RVR.  Asked to initiate heparin gtt for CHADS2Vasc score=6 while surgery plans pending.   CBC reviewed.    Goal of Therapy:  Heparin level 0.3-0.7 units/ml Monitor platelets by anticoagulation protocol: Yes   Plan:  Give 4000 units bolus x 1 Start heparin infusion at 1050 units/hr Check anti-Xa level in 8 hours and daily while on heparin Continue to monitor H&H and platelets  Raelyn Racette, Lavonia Drafts 10/01/2014,10:35 AM  Initial heparin level slightly below goal.  Increase heparin infusion to 1200 units/hr.  F/U 8 hr heparin level (am labs). Netta Cedars, PharmD,  BCPS 10/01/2014@8 :31 PM

## 2014-10-01 NOTE — Progress Notes (Signed)
Dr. Shanon Brow is notified about pt's low SBP and pain issue. The orders received and followed.

## 2014-10-01 NOTE — Progress Notes (Signed)
  Echocardiogram 2D Echocardiogram has been performed.  [place], [place] 10/01/2014, 10:19 AM

## 2014-10-01 NOTE — Care Management Utilization Note (Signed)
UR completed 

## 2014-10-01 NOTE — Progress Notes (Signed)
Patient admitted with intratrochanteric fracture dementia with A. fib and RVR presumably new currently not quite anticoagulated preoperative with hypotension with systolics in the 44W and ventricular response 09/16/2026 Tami [name] [id number] DOB: 08/05/1941 DOA: 09/30/2014 PCP: Tami Bogus, MD             Physical Exam: Blood pressure 87/64, pulse 31, temperature 97.5 F (36.4 C), temperature source Oral, resp. rate 17, height 5\' 9"  (1.753 m), weight 201 lb 15.1 oz (91.6 kg), SpO2 94 %. alert talkative in her degree of pain no dyspnea no orthopnea no JVD no carotid bruits no thyromegaly heart irregular regular no S3 he feels rubs abdomen soft nontender bowel sounds normoactive 1+ chronic pedal edema logically cranial nerves grossly intact patient was well 4 extremities   Investigations:  No results found for this or any previous visit (from the past 240 hour(s)).   Basic Metabolic Panel:  Recent Labs  09/30/14 2332 10/01/14 0323  NA 139 141  K 4.3 4.7  CL 105 107  CO2 30 29  GLUCOSE 109* 105*  BUN 15 16  CREATININE 1.01 1.11*  CALCIUM 8.5 8.3*  MG 1.9  --    Liver Function Tests:  Recent Labs  09/30/14 2332  AST 23  ALT 12  ALKPHOS 76  BILITOT 0.5  PROT 6.6  ALBUMIN 3.6     CBC:  Recent Labs  09/30/14 2332 10/01/14 0323  WBC 7.3 10.2  NEUTROABS 5.1  --   HGB 12.6 11.9*  HCT 40.4 39.4  MCV 88.6 88.9  PLT 282 267    Dg Chest 1 View  10/01/2014   CLINICAL DATA:  Status post fall. Concern for chest injury. Initial encounter.  EXAM: CHEST  1 VIEW  COMPARISON:  Chest radiograph from 08/09/2004, and CT of the chest performed 08/15/2004  FINDINGS: The lungs are well-aerated. Chronically increased interstitial markings are seen. Peribronchial thickening is noted. There is no evidence of pleural effusion or pneumothorax.  The cardiomediastinal silhouette is borderline enlarged. No acute osseous abnormalities are seen.  IMPRESSION: Chronically  increased interstitial markings seen. Peribronchial thickening noted. Borderline cardiomegaly.   Electronically Signed   By: Tami [name] M.D.   On: 10/01/2014 00:38   Dg Hip Unilat With Pelvis 2-3 Views Right  10/01/2014   CLINICAL DATA:  Acute onset of severe right hip pain, status post fall. Initial encounter.  EXAM: RIGHT HIP (WITH PELVIS) 2-3 VIEWS  COMPARISON:  None.  FINDINGS: There is a comminuted intertrochanteric fracture through the proximal right femur, with displaced comminuted lesser trochanteric fragments. There is mild lateral and distal displacement of the distal femur. The right femoral head remains seated at the acetabulum. The left hip joint is grossly unremarkable. The cross-table lateral views are markedly suboptimal due to the patient's habitus.  Mild degenerative change is noted at the lower lumbar spine. The sacroiliac joints are grossly unremarkable in appearance. The visualized bowel gas pattern is grossly unremarkable.  IMPRESSION: Comminuted intertrochanteric fracture through the proximal right femur, with displaced comminuted lesser trochanteric fragments. Mild lateral and distal displacement of the distal femur.   Electronically Signed   By: Tami [name] M.D.   On: 10/01/2014 00:41      Medications:   Impression: Hypotension  Principal Problem:   Hip fracture, right Active Problems:   Anxiety state   Essential hypertension   Atrial fibrillation with RVR   Fall at home   Atrial fibrillation with rapid ventricular response   Dementia  Plan: Add Lopressor 25 mg by mouth twice a day to decrease ventricular response and increase diastolic filling. Obtain TSH 2-D echo for ventricular function and chamber dimensions IV fluid bolus of 0.9 normal saline cardiology consultation requested Ortho Eval already ordered patient not anticoagulated since admission due to presumed preop status  Consultants: Orthopedics and cardiology ordered   Procedures 2-D echo  ordered   Antibiotics:                   Code Status: Full   Family Communication:  None thus far  Disposition Plan A. fib RVR will add Lopressor 25 by mouth twice a day to control ventricular response and increase diastolic filling. Push IV fluids we'll defer to cardiology and fourth toe on timing or decision not to anticoagulate SCDs ordered  Time spent: 30 minutes   LOS: 1 day   Tami [name] M   10/01/2014, 6:58 AM

## 2014-10-01 NOTE — Consult Note (Signed)
Reason for Consult:fracture of the right hip Referring Physician: ER - Tami [name] is an 74 y.o. female.  HPI: Patient fell at home last night in her kitchen and hurt her right hip.  She was unable to get up.  She was seen in the ER.  X-rays show a right hip intertrochanteric fracture.  She has atrial fibrillation which apparently is new.  This will need to be evaluated prior to any surgery on the hip.  She has a history of early dementia and multiple joint arthritis.  Past Medical History  Diagnosis Date  . Arthritis   . Cancer Cervical  . Hypertension   . Stroke   . Anxiety     Past Surgical History  Procedure Laterality Date  . Knee arthroscopy Right   . Femur closed reduction Right   . Appendectomy    . Cholecystectomy    . Brain surgery    . Tonsillectomy      No family history on file.  Social History:  reports that she has quit smoking. She has never used smokeless tobacco. She reports that she does not drink alcohol or use illicit drugs.  Allergies:  Allergies  Allergen Reactions  . Penicillins     Medications: I have reviewed the patient's current medications.  Results for orders placed or performed during the hospital encounter of 09/30/14 (from the past 48 hour(s))  Comprehensive metabolic panel     Status: Abnormal   Collection Time: 09/30/14 11:32 PM  Result Value Ref Range   Sodium 139 135 - 145 mmol/L   Potassium 4.3 3.5 - 5.1 mmol/L   Chloride 105 96 - 112 mmol/L   CO2 30 19 - 32 mmol/L   Glucose, Bld 109 (H) 70 - 99 mg/dL   BUN 15 6 - 23 mg/dL   Creatinine, Ser 1.01 0.50 - 1.10 mg/dL   Calcium 8.5 8.4 - 10.5 mg/dL   Total Protein 6.6 6.0 - 8.3 g/dL   Albumin 3.6 3.5 - 5.2 g/dL   AST 23 0 - 37 U/L   ALT 12 0 - 35 U/L   Alkaline Phosphatase 76 39 - 117 U/L   Total Bilirubin 0.5 0.3 - 1.2 mg/dL   GFR calc non Af Amer 53 (L) >90 mL/min   GFR calc Af Amer 62 (L) >90 mL/min    Comment: (NOTE) The eGFR has been calculated using the CKD  EPI equation. This calculation has not been validated in all clinical situations. eGFR's persistently <90 mL/min signify possible Chronic Kidney Disease.    Anion gap 4 (L) 5 - 15  CBC with Differential     Status: None   Collection Time: 09/30/14 11:32 PM  Result Value Ref Range   WBC 7.3 4.0 - 10.5 K/uL   RBC 4.56 3.87 - 5.11 MIL/uL   Hemoglobin 12.6 12.0 - 15.0 g/dL   HCT 40.4 36.0 - 46.0 %   MCV 88.6 78.0 - 100.0 fL   MCH 27.6 26.0 - 34.0 pg   MCHC 31.2 30.0 - 36.0 g/dL   RDW 14.7 11.5 - 15.5 %   Platelets 282 150 - 400 K/uL   Neutrophils Relative % 70 43 - 77 %   Neutro Abs 5.1 1.7 - 7.7 K/uL   Lymphocytes Relative 21 12 - 46 %   Lymphs Abs 1.5 0.7 - 4.0 K/uL   Monocytes Relative 7 3 - 12 %   Monocytes Absolute 0.5 0.1 - 1.0 K/uL   Eosinophils Relative  2 0 - 5 %   Eosinophils Absolute 0.1 0.0 - 0.7 K/uL   Basophils Relative 0 0 - 1 %   Basophils Absolute 0.0 0.0 - 0.1 K/uL  Protime-INR     Status: None   Collection Time: 09/30/14 11:32 PM  Result Value Ref Range   Prothrombin Time 14.1 11.6 - 15.2 seconds   INR 1.08 0.00 - 1.49  Sample to Blood Bank     Status: None   Collection Time: 09/30/14 11:32 PM  Result Value Ref Range   Blood Bank Specimen SAMPLE AVAILABLE FOR TESTING    Sample Expiration 10/03/2014   APTT     Status: None   Collection Time: 09/30/14 11:32 PM  Result Value Ref Range   aPTT 26 24 - 37 seconds  Brain natriuretic peptide     Status: Abnormal   Collection Time: 09/30/14 11:32 PM  Result Value Ref Range   B Natriuretic Peptide 348.0 (H) 0.0 - 100.0 pg/mL  Troponin I     Status: None   Collection Time: 09/30/14 11:32 PM  Result Value Ref Range   Troponin I <0.03 <0.031 ng/mL    Comment:        NO INDICATION OF MYOCARDIAL INJURY.   Magnesium     Status: None   Collection Time: 09/30/14 11:32 PM  Result Value Ref Range   Magnesium 1.9 1.5 - 2.5 mg/dL  Urinalysis, Routine w reflex microscopic     Status: None   Collection Time: 10/01/14  12:40 AM  Result Value Ref Range   Color, Urine YELLOW YELLOW   APPearance CLEAR CLEAR   Specific Gravity, Urine 1.020 1.005 - 1.030   pH 5.5 5.0 - 8.0   Glucose, UA NEGATIVE NEGATIVE mg/dL   Hgb urine dipstick NEGATIVE NEGATIVE   Bilirubin Urine NEGATIVE NEGATIVE   Ketones, ur NEGATIVE NEGATIVE mg/dL   Protein, ur NEGATIVE NEGATIVE mg/dL   Urobilinogen, UA 0.2 0.0 - 1.0 mg/dL   Nitrite NEGATIVE NEGATIVE   Leukocytes, UA NEGATIVE NEGATIVE    Comment: MICROSCOPIC NOT DONE ON URINES WITH NEGATIVE PROTEIN, BLOOD, LEUKOCYTES, NITRITE, OR GLUCOSE <1000 mg/dL.  Protime-INR     Status: None   Collection Time: 10/01/14  3:22 AM  Result Value Ref Range   Prothrombin Time 14.3 11.6 - 15.2 seconds   INR 1.10 0.00 - 1.49  Troponin I (q 6hr x 3)     Status: None   Collection Time: 10/01/14  3:22 AM  Result Value Ref Range   Troponin I <0.03 <0.031 ng/mL  CBC     Status: Abnormal   Collection Time: 10/01/14  3:23 AM  Result Value Ref Range   WBC 10.2 4.0 - 10.5 K/uL   RBC 4.43 3.87 - 5.11 MIL/uL   Hemoglobin 11.9 (L) 12.0 - 15.0 g/dL   HCT 39.4 36.0 - 46.0 %   MCV 88.9 78.0 - 100.0 fL   MCH 26.9 26.0 - 34.0 pg   MCHC 30.2 30.0 - 36.0 g/dL   RDW 14.6 11.5 - 15.5 %   Platelets 267 150 - 400 K/uL  Basic metabolic panel     Status: Abnormal   Collection Time: 10/01/14  3:23 AM  Result Value Ref Range   Sodium 141 135 - 145 mmol/L   Potassium 4.7 3.5 - 5.1 mmol/L   Chloride 107 96 - 112 mmol/L   CO2 29 19 - 32 mmol/L   Glucose, Bld 105 (H) 70 - 99 mg/dL   BUN 16 6 -  23 mg/dL   Creatinine, Ser 1.11 (H) 0.50 - 1.10 mg/dL   Calcium 8.3 (L) 8.4 - 10.5 mg/dL   GFR calc non Af Amer 47 (L) >90 mL/min   GFR calc Af Amer 55 (L) >90 mL/min    Comment: (NOTE) The eGFR has been calculated using the CKD EPI equation. This calculation has not been validated in all clinical situations. eGFR's persistently <90 mL/min signify possible Chronic Kidney Disease.    Anion gap 5 5 - 15  TSH     Status:  None   Collection Time: 10/01/14  3:23 AM  Result Value Ref Range   TSH 2.928 0.350 - 4.500 uIU/mL    Dg Chest 1 View  10/01/2014   CLINICAL DATA:  Status post fall. Concern for chest injury. Initial encounter.  EXAM: CHEST  1 VIEW  COMPARISON:  Chest radiograph from 08/09/2004, and CT of the chest performed 08/15/2004  FINDINGS: The lungs are well-aerated. Chronically increased interstitial markings are seen. Peribronchial thickening is noted. There is no evidence of pleural effusion or pneumothorax.  The cardiomediastinal silhouette is borderline enlarged. No acute osseous abnormalities are seen.  IMPRESSION: Chronically increased interstitial markings seen. Peribronchial thickening noted. Borderline cardiomegaly.   Electronically Signed   By: Garald Balding M.D.   On: 10/01/2014 00:38   Dg Hip Unilat With Pelvis 2-3 Views Right  10/01/2014   CLINICAL DATA:  Acute onset of severe right hip pain, status post fall. Initial encounter.  EXAM: RIGHT HIP (WITH PELVIS) 2-3 VIEWS  COMPARISON:  None.  FINDINGS: There is a comminuted intertrochanteric fracture through the proximal right femur, with displaced comminuted lesser trochanteric fragments. There is mild lateral and distal displacement of the distal femur. The right femoral head remains seated at the acetabulum. The left hip joint is grossly unremarkable. The cross-table lateral views are markedly suboptimal due to the patient's habitus.  Mild degenerative change is noted at the lower lumbar spine. The sacroiliac joints are grossly unremarkable in appearance. The visualized bowel gas pattern is grossly unremarkable.  IMPRESSION: Comminuted intertrochanteric fracture through the proximal right femur, with displaced comminuted lesser trochanteric fragments. Mild lateral and distal displacement of the distal femur.   Electronically Signed   By: Garald Balding M.D.   On: 10/01/2014 00:41    Review of Systems  Cardiovascular:       Hypertension and atrial  fibrillation (new).  Increased lipids.  Gastrointestinal:       GERD  Genitourinary:       UTI, history of bladder cancer  Musculoskeletal: Positive for falls.       Multiple arthralgias.  Post knee surgery, Dr. Eulas Post years ago.  Neurological:       Early Dementia   Blood pressure 87/64, pulse 31, temperature 97.5 F (36.4 C), temperature source Oral, resp. rate 17, height '5\' 9"'  (1.753 m), weight 91.6 kg (201 lb 15.1 oz), SpO2 94 %. Physical Exam  Constitutional: She is oriented to person, place, and time. She appears well-developed and well-nourished.  HENT:  Head: Normocephalic and atraumatic.  Eyes: Conjunctivae and EOM are normal. Pupils are equal, round, and reactive to light.  Neck: Normal range of motion. Neck supple.  Cardiovascular:  Rapid irregular irregular rhythm   Respiratory: Effort normal.  GI: Soft.  Musculoskeletal: She exhibits tenderness (Pain of right hip with any motion.  Leg lengths appear normal.).  Neurological: She is alert and oriented to person, place, and time. She has normal reflexes.  Skin: Skin is warm  and dry.  Psychiatric: She has a normal mood and affect. Her behavior is normal. Judgment and thought content normal.    Assessment/Plan: Intertrochanteric fracture of the right hip, acute Atrial fibrillation which is apparently new in onset.  This will need to be worked up prior to hip surgery. History of early dementia.  Rishab Stoudt 10/01/2014, 7:35 AM

## 2014-10-01 NOTE — Consult Note (Signed)
CARDIOLOGY CONSULT NOTE   Patient ID: CAMIYAH FRIBERG MRN: [id number] DOB/AGE: 01/12/1941 74 y.o.  Admit Date: 09/30/2014 Referring Physician: Lorriane Shire, Richard MD Primary Physician: Alonza Bogus, MD Consulting Cardiologist: Kate Sable MD Primary Cardiologist: New Reason for Consultation: New Onset Atrial fibrillation   Clinical Summary Ms. Hulme is a 74 y.o.female no know history of cardiovascular disease, but hx of hypertension, mild dementia, CVA, arthritis, and cancer, admitted after sustaining a mechanical fall in her kitchen. On arrival to ER she was also found to be in atrial fib with RVR. She fractured her right hip. She is a poor historian, and husband is not at bedside. Hx is obtained from current and past medical records.     In ER, BP 130.89, HR 132 bpm. Negative for UTI, she was not found to be anemic, or hyponatremic. She was treated with fentanyl and placed on diltiazem gtt, after 10 mg bolus. This was discontinued due to hypotension. HR now in the 110-120 bpm range. Troponin negative X 2. She is unable to feel irregular HR and does not know how long she could have potentially had this. CT in 2009 demonstrated "remote PCA infarction with remote basal ganglia lacunar infarctions." She has been seen by orthopedics who are delaying hip repair until we evaluate.     She denies dizziness, near syncope, chest pain or diaphoresis prior to the fall. She states she was talking to her husband, turned to walk and fell. Main complaint at this time is right hip pain.   Allergies  Allergen Reactions  . Penicillins     Medications Scheduled Medications: . [START ON 10/02/2014] Influenza vac split quadrivalent PF  0.5 mL Intramuscular Tomorrow-1000  . metoprolol tartrate  25 mg Oral BID      PRN Medications: HYDROcodone-acetaminophen, methocarbamol **OR** methocarbamol (ROBAXIN)  IV, morphine injection   Past Medical History  Diagnosis Date  . Arthritis   . Cancer  Cervical  . Hypertension   . Stroke   . Anxiety     Past Surgical History  Procedure Laterality Date  . Knee arthroscopy Right   . Femur closed reduction Right   . Appendectomy    . Cholecystectomy    . Brain surgery    . Tonsillectomy      Family History  Problem Relation Age of Onset  . Stroke Mother     Social History Ms. Faught reports that she has quit smoking. She has never used smokeless tobacco. Ms. Folz reports that she does not drink alcohol.  Review of Systems Complete review of systems are found to be negative unless outlined in H&P above.  Physical Examination Blood pressure 93/57, pulse 122, temperature 97.9 F (36.6 C), temperature source Oral, resp. rate 16, height 5\' 9"  (1.753 m), weight 201 lb 15.1 oz (91.6 kg), SpO2 95 %.  Intake/Output Summary (Last 24 hours) at 10/01/14 0856 Last data filed at 10/01/14 0645  Gross per 24 hour  Intake      0 ml  Output    100 ml  Net   -100 ml    Telemetry: Atrial fib, rates variable 110-120 bpm.  GEN: HEENT: Conjunctiva and lids normal, oropharynx clear with moist mucosa. Neck: Supple, no elevated JVP or carotid bruits, no thyromegaly. Lungs: Clear to auscultation, nonlabored breathing at rest. Cardiac: Regular rate and rhythm, no S3 or significant systolic murmur, no pericardial rub. Abdomen: Soft, nontender, no hepatomegaly, bowel sounds present, no guarding or rebound. Extremities: No pitting edema, distal pulses  2+. Skin: Warm and dry. Musculoskeletal: No kyphosis. Neuropsychiatric: Alert and oriented x3, affect grossly appropriate.  Prior Cardiac Testing/Procedures None  Lab Results  Basic Metabolic Panel:  Recent Labs Lab 09/30/14 2332 10/01/14 0323  NA 139 141  K 4.3 4.7  CL 105 107  CO2 30 29  GLUCOSE 109* 105*  BUN 15 16  CREATININE 1.01 1.11*  CALCIUM 8.5 8.3*  MG 1.9  --     Liver Function Tests:  Recent Labs Lab 09/30/14 2332  AST 23  ALT 12  ALKPHOS 76  BILITOT 0.5    PROT 6.6  ALBUMIN 3.6    CBC:  Recent Labs Lab 09/30/14 2332 10/01/14 0323  WBC 7.3 10.2  NEUTROABS 5.1  --   HGB 12.6 11.9*  HCT 40.4 39.4  MCV 88.6 88.9  PLT 282 267    Cardiac Enzymes:  Recent Labs Lab 09/30/14 2332 10/01/14 0322  TROPONINI <0.03 <0.03    BNP: Invalid input(s): POCBNP   Radiology: Dg Chest 1 View  10/01/2014   CLINICAL DATA:  Status post fall. Concern for chest injury. Initial encounter.  EXAM: CHEST  1 VIEW  COMPARISON:  Chest radiograph from 08/09/2004, and CT of the chest performed 08/15/2004  FINDINGS: The lungs are well-aerated. Chronically increased interstitial markings are seen. Peribronchial thickening is noted. There is no evidence of pleural effusion or pneumothorax.  The cardiomediastinal silhouette is borderline enlarged. No acute osseous abnormalities are seen.  IMPRESSION: Chronically increased interstitial markings seen. Peribronchial thickening noted. Borderline cardiomegaly.   Electronically Signed   By: Garald Balding M.D.   On: 10/01/2014 00:38   Dg Hip Unilat With Pelvis 2-3 Views Right  10/01/2014   CLINICAL DATA:  Acute onset of severe right hip pain, status post fall. Initial encounter.  EXAM: RIGHT HIP (WITH PELVIS) 2-3 VIEWS  COMPARISON:  None.  FINDINGS: There is a comminuted intertrochanteric fracture through the proximal right femur, with displaced comminuted lesser trochanteric fragments. There is mild lateral and distal displacement of the distal femur. The right femoral head remains seated at the acetabulum. The left hip joint is grossly unremarkable. The cross-table lateral views are markedly suboptimal due to the patient's habitus.  Mild degenerative change is noted at the lower lumbar spine. The sacroiliac joints are grossly unremarkable in appearance. The visualized bowel gas pattern is grossly unremarkable.  IMPRESSION: Comminuted intertrochanteric fracture through the proximal right femur, with displaced comminuted lesser  trochanteric fragments. Mild lateral and distal displacement of the distal femur.   Electronically Signed   By: Garald Balding M.D.   On: 10/01/2014 00:41     ECG: Atrial fibrillation rate of 124 bpm.   Impression and Recommendations  1.Atrial fib with RVR: Had been on diltiazem gtt but has been d/c'd due to hypotension. Likely related to fall, but uncertain duration. Echo is ordered. She on hold for AV nodal blocking agents at this time. Consider adding digoxin, load and maintenance peri-operatively. Creatinine of 1.11 this am, GRF 47.   She is receiving IV fluid hydration to assist with BP bur remains low normal. Pain control medications also contributing. She is not on anticoagulation currently on hold due to planned surgery over the weekend. CHADS VASC Score of 6. Recommend heparin gtt.   2. Hypertension: Review of home medications have her on benazepril 20 mg daily. This has not been reordered since admission as she had been on dilt gtt and is hypotensive. Would not restart until BP is more stable.   3,  Hx of CVA: Most recent CT in 2009 is reviewed. She did not express sudden weakness or dizziness prior to fall. She is on Plavix at home.   Signed: Phill Myron. Lawrence NP AACC  10/01/2014, 8:56 AM Co-Sign MD  The patient was seen and examined, and I agree with the assessment and plan as documented above, with modifications as noted below. Pt admitted with fall and right comminuted intertrochanteric fracture, found to be in rapid atrial fibrillation. Has no h/o arrhythmia, but endorses h/o HTN and CVA. Takes Plavix at home. Currently denies chest pain, palpitations, and shortness of breath. TSH, CBC, troponin, and BMET all unremarkable. BNP minimally elevated with no radiographic or clinical evidence of CHF. Echocardiogram has been performed but not reviewed. Diltiazem infusion initiated but subsequently discontinued due to hypotension, presumably secondary to a combination of pain  medication and rapid atrial fibrillation. She is now IV fluids and receiving boluses as well to increase BP.  I recommend using IV and oral digoxin for rate control. I will also give a one-time bolus of amiodarone 150 mg IV to see if this converts her to normal sinus rhythm.  I agree with IV heparin given elevated thromboembolic risk (CHADSVASC score 6) until she proceeds with surgery.

## 2014-10-01 NOTE — Care Management Note (Addendum)
    Page 1 of 1   10/11/2014     3:59:49 PM CARE MANAGEMENT NOTE 10/11/2014  Patient:  Tami [name], Tami [name]   Account Number:  [id number]  Date Initiated:  10/01/2014  Documentation initiated by:  Jolene Provost  Subjective/Objective Assessment:   Pt is from home, lives with husband and independnet at baseline with use of walker/cane. Pt has no other DME's. Pt has no HH services or med needs prior to admission. Pt admitted for hip fx. Pending surgery and PT eval pt plans to discharge     Action/Plan:   to SNF for rehab. CSW aware of discharge plan and will arrange for placement if necessary. Will continue to follow for CM needs. Surgery not yet scheduled due to medical stability.   Anticipated DC Date:  10/05/2014   Anticipated DC Plan:  SKILLED NURSING FACILITY  In-house referral  Clinical Social Worker      DC Planning Services  CM consult      Choice offered to / List presented to:             Status of service:  Completed, signed off Medicare Important Message given?  YES (If response is "NO", the following Medicare IM given date fields will be blank) Date Medicare IM given:  10/01/2014 Medicare IM given by:  Vladimir Creeks Date Additional Medicare IM given:  10/08/2014 Additional Medicare IM given by:  JESSICA CHILDRESS  Discharge Disposition:  [facility]  Per UR Regulation:  Reviewed for med. necessity/level of care/duration of stay  If discussed at [place] of Stay Meetings, dates discussed:   10/05/2014  10/07/2014    Comments:  10/11/14 1350 Veronia Laprise RN/CM IM given- pt D/C to SNF 10/08/2014 [place], RN, MSN, CM Pt to have surgery today. Family previously requests to take pt home at discharge with [facility]. Will cont to follow and reassess discharge plan following PT eval. 10/07/2014 1400 Jolene Provost, RN, MSN, CM 10/01/2014 [place], RN, MSN, CM

## 2014-10-01 NOTE — H&P (Signed)
PCP:   HAWKINS,EDWARD Carlean Jews, MD   Chief Complaint:  fell  HPI: 74 yo female h/o dementia (assumed since she is on aricept), htn, hld comes in after a mechanical fall at home per husband report/ed doc report and hurt her right hip.  Pt says she just fell, cannot exactly tell me what happened.  Denies any recent illnesses.  Her hip is very painful.  On arrival she was found to be in afib with rvr and with rt hip fx.  No previous h/o afib.  No h/o chf.  Pt denies any chest pain.  No sob.  She has been on a cardizem gtt, her bp is now soft.    Review of Systems:  Positive and negative as per HPI otherwise all other systems are negative  Past Medical History: Past Medical History  Diagnosis Date  . Arthritis   . Cancer Cervical  . Hypertension   . Stroke   . Anxiety    Past Surgical History  Procedure Laterality Date  . Knee arthroscopy Right   . Femur closed reduction Right   . Appendectomy    . Cholecystectomy    . Brain surgery    . Tonsillectomy      Medications: Prior to Admission medications   Not on File    Allergies:   Allergies  Allergen Reactions  . Penicillins     Social History:  reports that she has quit smoking. She has never used smokeless tobacco. She reports that she does not drink alcohol or use illicit drugs.  Family History: none  Physical Exam: Filed Vitals:   09/30/14 2314 10/01/14 0030 10/01/14 0100  BP: 130/89 101/69 109/81  Pulse: 132 48 75  Temp: 97.8 F (36.6 C)    TempSrc: Oral    Resp: 16 17 21   Height: 5\' 10"  (1.778 m)    Weight: 76.204 kg (168 lb)    SpO2: 86% 83% 92%   General appearance: alert, cooperative and no distress  Mm dry Head: Normocephalic, without obvious abnormality, atraumatic Eyes: negative Nose: Nares normal. Septum midline. Mucosa normal. No drainage or sinus tenderness. Neck: no JVD and supple, symmetrical, trachea midline Lungs: clear to auscultation bilaterally Heart: irregularly irregular  rhythm Abdomen: soft, non-tender; bowel sounds normal; no masses,  no organomegaly Extremities: extremities normal, atraumatic, no cyanosis or edema Pulses: 2+ and symmetric Skin: Skin color, texture, turgor normal. No rashes or lesions Neurologic: Grossly normal   Labs on Admission:   Recent Labs  09/30/14 2332  NA 139  K 4.3  CL 105  CO2 30  GLUCOSE 109*  BUN 15  CREATININE 1.01  CALCIUM 8.5  MG 1.9    Recent Labs  09/30/14 2332  AST 23  ALT 12  ALKPHOS 76  BILITOT 0.5  PROT 6.6  ALBUMIN 3.6     Recent Labs  09/30/14 2332  WBC 7.3  NEUTROABS 5.1  HGB 12.6  HCT 40.4  MCV 88.6  PLT 282    Recent Labs  09/30/14 2332  TROPONINI <0.03   Radiological Exams on Admission: Dg Chest 1 View  10/01/2014   CLINICAL DATA:  Status post fall. Concern for chest injury. Initial encounter.  EXAM: CHEST  1 VIEW  COMPARISON:  Chest radiograph from 08/09/2004, and CT of the chest performed 08/15/2004  FINDINGS: The lungs are well-aerated. Chronically increased interstitial markings are seen. Peribronchial thickening is noted. There is no evidence of pleural effusion or pneumothorax.  The cardiomediastinal silhouette is borderline enlarged. No acute  osseous abnormalities are seen.  IMPRESSION: Chronically increased interstitial markings seen. Peribronchial thickening noted. Borderline cardiomegaly.   Electronically Signed   By: Garald Balding M.D.   On: 10/01/2014 00:38   Dg Hip Unilat With Pelvis 2-3 Views Right  10/01/2014   CLINICAL DATA:  Acute onset of severe right hip pain, status post fall. Initial encounter.  EXAM: RIGHT HIP (WITH PELVIS) 2-3 VIEWS  COMPARISON:  None.  FINDINGS: There is a comminuted intertrochanteric fracture through the proximal right femur, with displaced comminuted lesser trochanteric fragments. There is mild lateral and distal displacement of the distal femur. The right femoral head remains seated at the acetabulum. The left hip joint is grossly  unremarkable. The cross-table lateral views are markedly suboptimal due to the patient's habitus.  Mild degenerative change is noted at the lower lumbar spine. The sacroiliac joints are grossly unremarkable in appearance. The visualized bowel gas pattern is grossly unremarkable.  IMPRESSION: Comminuted intertrochanteric fracture through the proximal right femur, with displaced comminuted lesser trochanteric fragments. Mild lateral and distal displacement of the distal femur.   Electronically Signed   By: Garald Balding M.D.   On: 10/01/2014 00:41    Assessment/Plan  74 yo female mechanical fall at home with right intertrochanteric fracture also with new onset afib with rvr  Principal Problem:   Hip fracture, right-  Ortho has been called and are available over the weekend.  Ice chips only for now.  Will have to get her rate controlled prior to surgery.  See below.  Hold anticoagulants in case she can go to OR later today.  Active Problems:  Stable unless o/w noted   Anxiety state   Essential hypertension   Atrial fibrillation with RVR-  sbp around 80 right now, stop dilt gtt.  Give one liter ivf bolus, treat pain.  Restart dilt gtt later if bp improves.  Ck echo in am, obtain card consult.   Fall at home   Dementia  Full code.  Admit to stepdown.    DAVID,RACHAL A 10/01/2014, 3:16 AM

## 2014-10-02 DIAGNOSIS — F411 Generalized anxiety disorder: Secondary | ICD-10-CM | POA: Diagnosis not present

## 2014-10-02 DIAGNOSIS — I10 Essential (primary) hypertension: Secondary | ICD-10-CM | POA: Diagnosis not present

## 2014-10-02 DIAGNOSIS — F0390 Unspecified dementia without behavioral disturbance: Secondary | ICD-10-CM | POA: Diagnosis not present

## 2014-10-02 DIAGNOSIS — S72141A Displaced intertrochanteric fracture of right femur, initial encounter for closed fracture: Secondary | ICD-10-CM | POA: Diagnosis not present

## 2014-10-02 DIAGNOSIS — Z8673 Personal history of transient ischemic attack (TIA), and cerebral infarction without residual deficits: Secondary | ICD-10-CM | POA: Diagnosis not present

## 2014-10-02 DIAGNOSIS — M1990 Unspecified osteoarthritis, unspecified site: Secondary | ICD-10-CM | POA: Diagnosis not present

## 2014-10-02 DIAGNOSIS — E785 Hyperlipidemia, unspecified: Secondary | ICD-10-CM | POA: Diagnosis not present

## 2014-10-02 DIAGNOSIS — I481 Persistent atrial fibrillation: Secondary | ICD-10-CM | POA: Diagnosis not present

## 2014-10-02 DIAGNOSIS — A047 Enterocolitis due to Clostridium difficile: Secondary | ICD-10-CM | POA: Diagnosis not present

## 2014-10-02 LAB — BASIC METABOLIC PANEL
ANION GAP: 5 (ref 5–15)
BUN: 10 mg/dL (ref 6–23)
CALCIUM: 8.3 mg/dL — AB (ref 8.4–10.5)
CO2: 29 mmol/L (ref 19–32)
CREATININE: 0.97 mg/dL (ref 0.50–1.10)
Chloride: 104 mmol/L (ref 96–112)
GFR calc Af Amer: 65 mL/min — ABNORMAL LOW (ref >90)
GFR, EST NON AFRICAN AMERICAN: 56 mL/min — AB (ref >90)
Glucose, Bld: 87 mg/dL (ref 70–99)
Potassium: 4.5 mmol/L (ref 3.5–5.1)
Sodium: 138 mmol/L (ref 135–145)

## 2014-10-02 LAB — CBC
HEMATOCRIT: 39.4 % (ref 36.0–46.0)
Hemoglobin: 11.9 g/dL — ABNORMAL LOW (ref 12.0–15.0)
MCH: 27.2 pg (ref 26.0–34.0)
MCHC: 30.2 g/dL (ref 30.0–36.0)
MCV: 90.2 fL (ref 78.0–100.0)
Platelets: 261 10*3/uL (ref 150–400)
RBC: 4.37 MIL/uL (ref 3.87–5.11)
RDW: 14.7 % (ref 11.5–15.5)
WBC: 7.5 10*3/uL (ref 4.0–10.5)

## 2014-10-02 LAB — HEPARIN LEVEL (UNFRACTIONATED)
HEPARIN UNFRACTIONATED: 0.28 [IU]/mL — AB (ref 0.30–0.70)
HEPARIN UNFRACTIONATED: 0.54 [IU]/mL (ref 0.30–0.70)

## 2014-10-02 LAB — CLOSTRIDIUM DIFFICILE BY PCR: Toxigenic C. Difficile by PCR: POSITIVE — AB

## 2014-10-02 MED ORDER — LORAZEPAM 0.5 MG PO TABS
0.5000 mg | ORAL_TABLET | Freq: Four times a day (QID) | ORAL | Status: DC | PRN
  Administered 2014-10-02 – 2014-10-09 (×10): 0.5 mg via ORAL
  Filled 2014-10-02 (×10): qty 1

## 2014-10-02 MED ORDER — METOPROLOL TARTRATE 25 MG PO TABS
25.0000 mg | ORAL_TABLET | Freq: Two times a day (BID) | ORAL | Status: DC
  Administered 2014-10-02 – 2014-10-03 (×3): 25 mg via ORAL
  Filled 2014-10-02 (×3): qty 1

## 2014-10-02 MED ORDER — METRONIDAZOLE 500 MG PO TABS
500.0000 mg | ORAL_TABLET | Freq: Three times a day (TID) | ORAL | Status: AC
  Administered 2014-10-02 – 2014-10-09 (×20): 500 mg via ORAL
  Filled 2014-10-02 (×20): qty 1

## 2014-10-02 MED ORDER — DILTIAZEM HCL 25 MG/5ML IV SOLN
10.0000 mg | Freq: Once | INTRAVENOUS | Status: AC
  Administered 2014-10-02: 10 mg via INTRAVENOUS
  Filled 2014-10-02: qty 5

## 2014-10-02 NOTE — Progress Notes (Signed)
Positive C-Diff results called to physician. Orders given and carried out.

## 2014-10-02 NOTE — Progress Notes (Addendum)
Patient has had 3 loose bowel movements during the shift. Stool sample sent to the lab to test for C-Diff per protocol. Enteric precautions initiated.

## 2014-10-02 NOTE — Progress Notes (Signed)
[place] for Heparin Indication: atrial fibrillation  Allergies  Allergen Reactions  . Penicillins Other (See Comments)    Child hood allergy(unknown reaction)    Patient Measurements: Height: 5\' 9"  (175.3 cm) Weight: 204 lb 2.3 oz (92.6 kg) IBW/kg (Calculated) : 66.2 Heparin Dosing Weight: 85kg  Vital Signs: Temp: 98.2 F (36.8 C) (02/06 0800) Temp Source: Oral (02/06 0800) BP: 125/69 mmHg (02/06 0913) Pulse Rate: 76 (02/06 0913)  Labs:  Recent Labs  09/30/14 2332 10/01/14 0322 10/01/14 0323 10/01/14 0910 10/01/14 1545 10/01/14 1914 10/02/14 0444  HGB 12.6  --  11.9*  --   --   --  11.9*  HCT 40.4  --  39.4  --   --   --  39.4  PLT 282  --  267  --   --   --  261  APTT 26  --   --   --   --   --   --   LABPROT 14.1 14.3  --   --   --   --   --   INR 1.08 1.10  --   --   --   --   --   HEPARINUNFRC  --   --   --   --   --  0.27* 0.28*  CREATININE 1.01  --  1.11*  --   --   --  0.97  TROPONINI <0.03 <0.03  --  <0.03 <0.03  --   --     Estimated Creatinine Clearance: 60.8 mL/min (by C-G formula based on Cr of 0.97).   Medical History: Past Medical History  Diagnosis Date  . Arthritis   . Cancer Cervical  . Hypertension   . Stroke   . Anxiety     Medications:  Scheduled:  . digoxin  0.125 mg Oral Daily    Assessment: 43 yoF admitted with hip fx found to be in Afib with RVR.  Asked to initiate heparin gtt for CHADS2Vasc score=6 while surgery plans pending.   CBC reviewed.   Heparin still slightly <goal despite increased rate.  No bleeding noted.   Goal of Therapy:  Heparin level 0.3-0.7 units/ml Monitor platelets by anticoagulation protocol: Yes   Plan:  Increase heparin infusion to 1400 units/hr Recheck 8hr heparin level Daily heparin level & CBC while on heparin  Shawon Denzer, Lavonia Drafts 10/02/2014,9:53 AM

## 2014-10-02 NOTE — Progress Notes (Signed)
Patient appears a little alarmed at activities of staph during the night. I tried to reassure her consider Ativan 0.5 at bedtime when necessary tonight tracheal response with damage is 315 systolic ejection fraction is 65% severely dilated A we'll add Lopressor 25 twice a day JERE VANBUREN VVO:[id number] DOB: 1941/04/19 DOA: 09/30/2014 PCP: Alonza Bogus, MD             Physical Exam: Blood pressure 125/69, pulse 76, temperature 98.2 F (36.8 C), temperature source Oral, resp. rate 17, height 5\' 9"  (1.753 m), weight 204 lb 2.3 oz (92.6 kg), SpO2 95 %. neck no JVD lungs diminished breath sounds at bases no rales wheeze or rhonchi heart irregular regular no S3 auscultated measles rubs   Investigations:  Recent Results (from the past 240 hour(s))  MRSA PCR Screening     Status: None   Collection Time: 10/01/14  3:00 AM  Result Value Ref Range Status   MRSA by PCR NEGATIVE NEGATIVE Final    Comment:        The GeneXpert MRSA Assay (FDA approved for NASAL specimens only), is one component of a comprehensive MRSA colonization surveillance program. It is not intended to diagnose MRSA infection nor to guide or monitor treatment for MRSA infections.      Basic Metabolic Panel:  Recent Labs  09/30/14 2332 10/01/14 0323 10/02/14 0444  NA 139 141 138  K 4.3 4.7 4.5  CL 105 107 104  CO2 30 29 29   GLUCOSE 109* 105* 87  BUN 15 16 10   CREATININE 1.01 1.11* 0.97  CALCIUM 8.5 8.3* 8.3*  MG 1.9  --   --    Liver Function Tests:  Recent Labs  09/30/14 2332  AST 23  ALT 12  ALKPHOS 76  BILITOT 0.5  PROT 6.6  ALBUMIN 3.6     CBC:  Recent Labs  09/30/14 2332 10/01/14 0323 10/02/14 0444  WBC 7.3 10.2 7.5  NEUTROABS 5.1  --   --   HGB 12.6 11.9* 11.9*  HCT 40.4 39.4 39.4  MCV 88.6 88.9 90.2  PLT 282 267 261    Dg Chest 1 View  10/01/2014   CLINICAL DATA:  Status post fall. Concern for chest injury. Initial encounter.  EXAM: CHEST  1 VIEW  COMPARISON:  Chest  radiograph from 08/09/2004, and CT of the chest performed 08/15/2004  FINDINGS: The lungs are well-aerated. Chronically increased interstitial markings are seen. Peribronchial thickening is noted. There is no evidence of pleural effusion or pneumothorax.  The cardiomediastinal silhouette is borderline enlarged. No acute osseous abnormalities are seen.  IMPRESSION: Chronically increased interstitial markings seen. Peribronchial thickening noted. Borderline cardiomegaly.   Electronically Signed   By: Garald Balding M.D.   On: 10/01/2014 00:38   Dg Hip Unilat With Pelvis 2-3 Views Right  10/01/2014   CLINICAL DATA:  Acute onset of severe right hip pain, status post fall. Initial encounter.  EXAM: RIGHT HIP (WITH PELVIS) 2-3 VIEWS  COMPARISON:  None.  FINDINGS: There is a comminuted intertrochanteric fracture through the proximal right femur, with displaced comminuted lesser trochanteric fragments. There is mild lateral and distal displacement of the distal femur. The right femoral head remains seated at the acetabulum. The left hip joint is grossly unremarkable. The cross-table lateral views are markedly suboptimal due to the patient's habitus.  Mild degenerative change is noted at the lower lumbar spine. The sacroiliac joints are grossly unremarkable in appearance. The visualized bowel gas pattern is grossly unremarkable.  IMPRESSION: Comminuted  intertrochanteric fracture through the proximal right femur, with displaced comminuted lesser trochanteric fragments. Mild lateral and distal displacement of the distal femur.   Electronically Signed   By: Garald Balding M.D.   On: 10/01/2014 00:41      Medications:   Impression: Anxiety  Principal Problem:   Hip fracture, right Active Problems:   Anxiety state   Essential hypertension   Atrial fibrillation with RVR   Fall at home   Atrial fibrillation with rapid ventricular response   Dementia     Plan: Add Lopressor 25 by mouth twice a day for rate  control  Consultants: Cardiology and gastroenterology   Procedures   Antibiotics:                   Code Status: Full  Family Communication:    Disposition Plan add Lopressor 25 by mouth twice a day for ventricular response continue IV heparin  Time spent: 30 minutes   LOS: 2 days   Elma Limas M   10/02/2014, 10:31 AM

## 2014-10-03 LAB — BASIC METABOLIC PANEL
Anion gap: 6 (ref 5–15)
BUN: 16 mg/dL (ref 6–23)
CALCIUM: 8.4 mg/dL (ref 8.4–10.5)
CO2: 29 mmol/L (ref 19–32)
Chloride: 104 mmol/L (ref 96–112)
Creatinine, Ser: 0.73 mg/dL (ref 0.50–1.10)
GFR calc non Af Amer: 82 mL/min — ABNORMAL LOW (ref >90)
GLUCOSE: 91 mg/dL (ref 70–99)
Potassium: 4.3 mmol/L (ref 3.5–5.1)
SODIUM: 139 mmol/L (ref 135–145)

## 2014-10-03 LAB — CBC
HCT: 38.4 % (ref 36.0–46.0)
HEMOGLOBIN: 11.8 g/dL — AB (ref 12.0–15.0)
MCH: 27.4 pg (ref 26.0–34.0)
MCHC: 30.7 g/dL (ref 30.0–36.0)
MCV: 89.3 fL (ref 78.0–100.0)
PLATELETS: 272 10*3/uL (ref 150–400)
RBC: 4.3 MIL/uL (ref 3.87–5.11)
RDW: 14.3 % (ref 11.5–15.5)
WBC: 10.6 10*3/uL — ABNORMAL HIGH (ref 4.0–10.5)

## 2014-10-03 LAB — HEPARIN LEVEL (UNFRACTIONATED): Heparin Unfractionated: 0.4 IU/mL (ref 0.30–0.70)

## 2014-10-03 MED ORDER — METOPROLOL TARTRATE 50 MG PO TABS
50.0000 mg | ORAL_TABLET | Freq: Two times a day (BID) | ORAL | Status: DC
  Administered 2014-10-03: 50 mg via ORAL
  Filled 2014-10-03: qty 1

## 2014-10-03 NOTE — Progress Notes (Signed)
Intertrochanteric fracture presumed new onset A. fib RVR recent placed on digoxin with ventricular response diminished from 120 BPM. Lopressor 25 twice a day added yesterday ventricular response 90 today echo reveals systolic function ejection fraction of 6065% Will increase Lopressor to 50 twice a day today to increase diastolic filling period and augment forward cardiac output. Patient tested positive for C. difficile placed on Flagyl yesterday. Patient on IV heparin Tami [name] PTW:[id number] DOB: 20-Jan-1941 DOA: 09/30/2014 PCP: Tami Bogus, MD             Physical Exam: Blood pressure 116/64, pulse 53, temperature 97.7 F (36.5 C), temperature source Oral, resp. rate 16, height 5\' 9"  (1.753 m), weight 202 lb 9.6 oz (91.9 kg), SpO2 95 %. no JVD no carotid bruits lungs clear to A&P no rales wheeze rhonchi hello diminished breath sounds at bases heart irregular regular no S3 auscultated no heaves thrills rubs   Investigations:  Recent Results (from the past 240 hour(s))  MRSA PCR Screening     Status: None   Collection Time: 10/01/14  3:00 AM  Result Value Ref Range Status   MRSA by PCR NEGATIVE NEGATIVE Final    Comment:        The GeneXpert MRSA Assay (FDA approved for NASAL specimens only), is one component of a comprehensive MRSA colonization surveillance program. It is not intended to diagnose MRSA infection nor to guide or monitor treatment for MRSA infections.   Clostridium Difficile by PCR     Status: Abnormal   Collection Time: 10/02/14  2:43 PM  Result Value Ref Range Status   C difficile by pcr POSITIVE (A) NEGATIVE Final    Comment: CRITICAL RESULT CALLED TO, READ BACK BY AND VERIFIED WITH: SCHENOWITZ,L AT 5:30PM ON 10/02/14 BY [facility]      Basic Metabolic Panel:  Recent Labs  09/30/14 2332  10/02/14 0444 10/03/14 0458  NA 139  < > 138 139  K 4.3  < > 4.5 4.3  CL 105  < > 104 104  CO2 30  < > 29 29  GLUCOSE 109*  < > 87 91  BUN 15  < > 10  16  CREATININE 1.01  < > 0.97 0.73  CALCIUM 8.5  < > 8.3* 8.4  MG 1.9  --   --   --   < > = values in this interval not displayed. Liver Function Tests:  Recent Labs  09/30/14 2332  AST 23  ALT 12  ALKPHOS 76  BILITOT 0.5  PROT 6.6  ALBUMIN 3.6     CBC:  Recent Labs  09/30/14 2332  10/02/14 0444 10/03/14 0458  WBC 7.3  < > 7.5 10.6*  NEUTROABS 5.1  --   --   --   HGB 12.6  < > 11.9* 11.8*  HCT 40.4  < > 39.4 38.4  MCV 88.6  < > 90.2 89.3  PLT 282  < > 261 272  < > = values in this interval not displayed.  No results found.    Medications:   Impression: C difficile positivity  Principal Problem:   Hip fracture, right Active Problems:   Anxiety state   Essential hypertension   Atrial fibrillation with RVR   Fall at home   Atrial fibrillation with rapid ventricular response   Dementia     Plan: Increase Lopressor from 25 to 50 twice a day continue Flagyl for C. difficile continue IV heparin monitor ventricular response   Consultants: Cardiology  Procedures   Antibiotics: Flagyl 500 by mouth 3 times a day added yesterday                  Code Status: Full   Family Communication:    Disposition Plan monitor ventricular response hemodynamic stability electrolytes and hemoglobin  Time spent: 30 minutes   LOS: 3 days   Tami [name] M   10/03/2014, 11:25 AM

## 2014-10-03 NOTE — Progress Notes (Signed)
[place] for Heparin Indication: atrial fibrillation  Allergies  Allergen Reactions  . Penicillins Other (See Comments)    Child hood allergy(unknown reaction)    Patient Measurements: Height: 5\' 9"  (175.3 cm) Weight: 202 lb 9.6 oz (91.9 kg) IBW/kg (Calculated) : 66.2 Heparin Dosing Weight: 85kg  Vital Signs: Temp: 98.8 F (37.1 C) (02/07 0400) Temp Source: Axillary (02/07 0400) BP: 111/65 mmHg (02/07 0600) Pulse Rate: 92 (02/07 0600)  Labs:  Recent Labs  09/30/14 2332 10/01/14 0322 10/01/14 0323 10/01/14 0910 10/01/14 1545  10/02/14 0444 10/02/14 1607 10/03/14 0458  HGB 12.6  --  11.9*  --   --   --  11.9*  --  11.8*  HCT 40.4  --  39.4  --   --   --  39.4  --  38.4  PLT 282  --  267  --   --   --  261  --  272  APTT 26  --   --   --   --   --   --   --   --   LABPROT 14.1 14.3  --   --   --   --   --   --   --   INR 1.08 1.10  --   --   --   --   --   --   --   HEPARINUNFRC  --   --   --   --   --   < > 0.28* 0.54 0.40  CREATININE 1.01  --  1.11*  --   --   --  0.97  --  0.73  TROPONINI <0.03 <0.03  --  <0.03 <0.03  --   --   --   --   < > = values in this interval not displayed.  Estimated Creatinine Clearance: 73.4 mL/min (by C-G formula based on Cr of 0.73).   Medical History: Past Medical History  Diagnosis Date  . Arthritis   . Cancer Cervical  . Hypertension   . Stroke   . Anxiety     Medications:  Scheduled:  . digoxin  0.125 mg Oral Daily  . metoprolol tartrate  25 mg Oral BID  . metroNIDAZOLE  500 mg Oral Q8H    Assessment: 64 yoF admitted with hip fx found to be in Afib with RVR.  Asked to initiate heparin gtt for CHADS2Vasc score=6 while surgery plans pending.   CBC reviewed.   Heparin at goal.  No bleeding noted.   Goal of Therapy:  Heparin level 0.3-0.7 units/ml Monitor platelets by anticoagulation protocol: Yes   Plan:  Continue heparin infusion at 1400 units/hr Daily heparin level &  CBC while on heparin F/U surgical plans & plans for long-term [facility]  Biagio Borg 10/03/2014,8:22 AM

## 2014-10-04 LAB — CBC WITH DIFFERENTIAL/PLATELET
Basophils Absolute: 0 10*3/uL (ref 0.0–0.1)
Basophils Relative: 0 % (ref 0–1)
EOS ABS: 0 10*3/uL (ref 0.0–0.7)
EOS PCT: 0 % (ref 0–5)
HCT: 39.5 % (ref 36.0–46.0)
Hemoglobin: 12.5 g/dL (ref 12.0–15.0)
LYMPHS ABS: 1.6 10*3/uL (ref 0.7–4.0)
Lymphocytes Relative: 15 % (ref 12–46)
MCH: 27.5 pg (ref 26.0–34.0)
MCHC: 31.6 g/dL (ref 30.0–36.0)
MCV: 87 fL (ref 78.0–100.0)
Monocytes Absolute: 0.6 10*3/uL (ref 0.1–1.0)
Monocytes Relative: 6 % (ref 3–12)
Neutro Abs: 8.1 10*3/uL — ABNORMAL HIGH (ref 1.7–7.7)
Neutrophils Relative %: 79 % — ABNORMAL HIGH (ref 43–77)
PLATELETS: 272 10*3/uL (ref 150–400)
RBC: 4.54 MIL/uL (ref 3.87–5.11)
RDW: 14.3 % (ref 11.5–15.5)
WBC: 10.4 10*3/uL (ref 4.0–10.5)

## 2014-10-04 LAB — BASIC METABOLIC PANEL
Anion gap: 7 (ref 5–15)
BUN: 12 mg/dL (ref 6–23)
CO2: 29 mmol/L (ref 19–32)
Calcium: 8.7 mg/dL (ref 8.4–10.5)
Chloride: 103 mmol/L (ref 96–112)
Creatinine, Ser: 0.71 mg/dL (ref 0.50–1.10)
GFR calc Af Amer: >90 mL/min (ref >90)
GFR calc non Af Amer: 82 mL/min — ABNORMAL LOW (ref >90)
Glucose, Bld: 109 mg/dL — ABNORMAL HIGH (ref 70–99)
Potassium: 3.9 mmol/L (ref 3.5–5.1)
SODIUM: 139 mmol/L (ref 135–145)

## 2014-10-04 LAB — HEPARIN LEVEL (UNFRACTIONATED): HEPARIN UNFRACTIONATED: 0.54 [IU]/mL (ref 0.30–0.70)

## 2014-10-04 MED ORDER — METOPROLOL TARTRATE 50 MG PO TABS
50.0000 mg | ORAL_TABLET | Freq: Three times a day (TID) | ORAL | Status: DC
  Administered 2014-10-04 (×3): 50 mg via ORAL
  Filled 2014-10-04 (×3): qty 1

## 2014-10-04 NOTE — Progress Notes (Signed)
Pt is consistently non compliant with all monitors  Removes all monitors consistently.

## 2014-10-04 NOTE — Progress Notes (Signed)
[place] for Heparin Indication: atrial fibrillation  Allergies  Allergen Reactions  . Penicillins Other (See Comments)    Child hood allergy(unknown reaction)    Patient Measurements: Height: 5\' 9"  (175.3 cm) Weight: 199 lb 4.7 oz (90.4 kg) IBW/kg (Calculated) : 66.2 Heparin Dosing Weight: 85kg  Vital Signs: Temp: 97.6 F (36.4 C) (02/08 0400) Temp Source: Axillary (02/08 0400) BP: 153/101 mmHg (02/08 0600) Pulse Rate: 83 (02/08 0600)  Labs:  Recent Labs  10/01/14 0910 10/01/14 1545  10/02/14 0444 10/02/14 1607 10/03/14 0458 10/04/14 0454 10/04/14 0500  HGB  --   --   < > 11.9*  --  11.8* 12.5  --   HCT  --   --   --  39.4  --  38.4 39.5  --   PLT  --   --   --  261  --  272 272  --   HEPARINUNFRC  --   --   < > 0.28* 0.54 0.40  --  0.54  CREATININE  --   --   --  0.97  --  0.73 0.71  --   TROPONINI <0.03 <0.03  --   --   --   --   --   --   < > = values in this interval not displayed.  Estimated Creatinine Clearance: 72.8 mL/min (by C-G formula based on Cr of 0.71).   Medical History: Past Medical History  Diagnosis Date  . Arthritis   . Cancer Cervical  . Hypertension   . Stroke   . Anxiety     Medications:  Scheduled:  . digoxin  0.125 mg Oral Daily  . metoprolol tartrate  50 mg Oral BID  . metroNIDAZOLE  500 mg Oral Q8H    Assessment: 54 yoF admitted with hip fx found to be in Afib with RVR.  Asked to initiate heparin gtt for CHADS2Vasc score=6 while surgery plans pending.   CBC reviewed.   Heparin at goal.  No bleeding noted.   Goal of Therapy:  Heparin level 0.3-0.7 units/ml Monitor platelets by anticoagulation protocol: Yes   Plan:  Continue heparin infusion at 1400 units/hr Daily heparin level & CBC while on heparin F/U surgical plans & plans for long-term [facility]  Biagio Borg 10/04/2014,7:50 AM

## 2014-10-04 NOTE — Progress Notes (Signed)
Several family members came to visit today. I did educate each one on what C-diff is and how it is transmitted. I also emphasized that hand washing was a must. All agreed but two family members would not wear gown.

## 2014-10-04 NOTE — Progress Notes (Addendum)
Primary Cardiologist: Kate Sable MD  Cardiology Specific Problem List: 1. New Onset Atrial fibrillation 2. Hypertension   Subjective:   Pleasantly confused but is without complaint of pain with the exception of left hip with movement.   Now being treated for C-Diff.    Objective:   Temp:  [96.4 F (35.8 C)-98 F (36.7 C)] 98 F (36.7 C) (02/08 0730) Pulse Rate:  [48-128] 83 (02/08 0600) Resp:  [12-92] 22 (02/08 0600) BP: (101-163)/(60-130) 153/101 mmHg (02/08 0600) SpO2:  [19 %-100 %] 96 % (02/08 0600) Weight:  [199 lb 4.7 oz (90.4 kg)] 199 lb 4.7 oz (90.4 kg) (02/08 0500) Last BM Date: 10/02/14  Filed Weights   10/02/14 0400 10/03/14 0500 10/04/14 0500  Weight: 204 lb 2.3 oz (92.6 kg) 202 lb 9.6 oz (91.9 kg) 199 lb 4.7 oz (90.4 kg)    Intake/Output Summary (Last 24 hours) at 10/04/14 0845 Last data filed at 10/04/14 0600  Gross per 24 hour  Intake 883.28 ml  Output   1725 ml  Net -841.72 ml    Telemetry: Atrial fib rates in the 110-115.   Exam:  General: No acute distress.  HEENT: Conjunctiva and lids normal, oropharynx clear.  Lungs: Clear to auscultation, nonlabored. Difficult to auscultate posterior lung sounds. Diminished in the bases laterally.   Cardiac: No elevated JVP or bruits. IRRR, no gallop or rub.   Abdomen: Normoactive bowel sounds, nontender, nondistended.  Extremities: 1+ pitting edema, distal pulses full. Left leg turned outward, painful to touch and minimal movement.   Neuropsychiatric: Alert and oriented X 2 affect appropriate. cooperative   Lab Results:  Basic Metabolic Panel:  Recent Labs Lab 09/30/14 2332  10/02/14 0444 10/03/14 0458 10/04/14 0454  NA 139  < > 138 139 139  K 4.3  < > 4.5 4.3 3.9  CL 105  < > 104 104 103  CO2 30  < > 29 29 29   GLUCOSE 109*  < > 87 91 109*  BUN 15  < > 10 16 12   CREATININE 1.01  < > 0.97 0.73 0.71  CALCIUM 8.5  < > 8.3* 8.4 8.7  MG 1.9  --   --   --   --   < > = values in this  interval not displayed.  Liver Function Tests:  Recent Labs Lab 09/30/14 2332  AST 23  ALT 12  ALKPHOS 76  BILITOT 0.5  PROT 6.6  ALBUMIN 3.6    CBC:  Recent Labs Lab 10/02/14 0444 10/03/14 0458 10/04/14 0454  WBC 7.5 10.6* 10.4  HGB 11.9* 11.8* 12.5  HCT 39.4 38.4 39.5  MCV 90.2 89.3 87.0  PLT 261 272 272    Cardiac Enzymes:  Recent Labs Lab 10/01/14 0322 10/01/14 0910 10/01/14 1545  TROPONINI <0.03 <0.03 <0.03    Coagulation:  Recent Labs Lab 09/30/14 2332 10/01/14 0322  INR 1.08 1.10    Echocardiogram 10/01/2014 Left ventricle: The cavity size was normal. Systolic function was normal. The estimated ejection fraction was in the range of 60% to 65%. Images were inadequate for LV wall motion assessment. The study was not technically sufficient to allow evaluation of LV diastolic dysfunction due to atrial fibrillation. There was no evidence of elevated ventricular filling pressure by Doppler parameters. Moderate to severe concentric left ventricular hypertrophy. - Aortic valve: Mildly calcified annulus. Moderately thickened, mildly calcified leaflets. There was no stenosis. There was trivial regurgitation. Mean gradient (S): 3 mm Hg. - Aorta: Mild ascending aortic dilatation,  maximum diameter 3.74 cm. Mild aortic root dilatation. Aortic root dimension: 42 mm (ED). - Mitral valve: Mildly calcified annulus. Normal thickness leaflets . There was mild to moderate regurgitation. - Left atrium: The atrium was moderately to severely dilated. - Right atrium: The atrium was mildly dilated. - Tricuspid valve: There was mild-moderate regurgitation. - Pulmonary arteries: PA peak pressure: 40 mm Hg (S). Mildly elevated pulmonary pressures. - Inferior vena cava: The vessel was dilated. The respirophasic diameter changes were blunted (< 50%), consistent with elevated central venous pressure.   Medications:   Scheduled  Medications: . digoxin  0.125 mg Oral Daily  . metoprolol tartrate  50 mg Oral BID  . metroNIDAZOLE  500 mg Oral Q8H    Infusions: . heparin 1,400 Units/hr (10/04/14 0600)    PRN Medications: HYDROcodone-acetaminophen, LORazepam, methocarbamol **OR** methocarbamol (ROBAXIN)  IV, morphine injection   Assessment and Plan:   1. Atrial fib with RVR: CHADS VASC Score of 6. Rate is better controlled currently but not optimal  She was given one IV dose of amiodarone 150 mg on Saturday but did not convert. Diltiazem infusion was stopped last week as well due to hypotension.     She has now been placed on metoprolol 25 mg BID. BP is slightly elevated today. This may be related to infective process and ongoing pain with left hip fx. She is on heparin gtt in anticipation of surgery to repair hip. Post hip repair, will transition to NOAC when surgeon is comfortable.  2. Hypertension: Labile. Elevated this am. Question absorption in the setting of C-Diff. Consider IV lopressor if BP does not respond to po.   3. Newly diagnosed C-Diff: Metronidazole treatment per PTH.   4. Left Hip Fx: Not yet ready for surgery due to infective process.   Phill Myron. Lawrence NP [place]    Attending note:  Patient seen and examined. Reviewed records including consultation by Dr. Bronson Ing, and discussed the case with Ms. Lawrence NP. Ms. Shuster awaits left hip surgery (fracture), although trying to stabilize other medical issues. She has persistent atrial fibrillation - currently on Lopressor and Heparin. Prior hypotension with diltiazem. Also recently tested positive for C. difficile. She appears comfortable at rest, lungs with diminished breath sounds but nonlabored breathing, cardiac exam with irregularly irregular rhythm. Will try and increase her Lopressor to 3 times a day dosing, watch blood pressure closely.  Satira Sark, M.D., F.A.C.C.

## 2014-10-04 NOTE — Progress Notes (Signed)
Subjective: She was admitted with a hip fracture. Her situation has become complicated by the fact that she has atrial fibrillation which is new and now has Clostridium difficile enteritis. She has no complaints. She is pleasantly confused as always  Objective: Vital signs in last 24 hours: Temp:  [96.4 F (35.8 C)-97.9 F (36.6 C)] 97.6 F (36.4 C) (02/08 0400) Pulse Rate:  [48-128] 83 (02/08 0600) Resp:  [12-92] 22 (02/08 0600) BP: (101-163)/(60-130) 153/101 mmHg (02/08 0600) SpO2:  [19 %-100 %] 96 % (02/08 0600) Weight:  [90.4 kg (199 lb 4.7 oz)] 90.4 kg (199 lb 4.7 oz) (02/08 0500) Weight change: -1.5 kg (-3 lb 4.9 oz) Last BM Date: 10/02/14  Intake/Output from previous day: 02/07 0701 - 02/08 0700 In: 883.3 [I.V.:883.3] Out: 1950 [Urine:1950]  PHYSICAL EXAM General appearance: alert, no distress and Confused Resp: clear to auscultation bilaterally Cardio: Her heart is irregular and heart rate is up as high as 130 while I am examining her GI: soft, non-tender; bowel sounds normal; no masses,  no organomegaly Extremities: No edema  Lab Results:  Results for orders placed or performed during the hospital encounter of 09/30/14 (from the past 48 hour(s))  Clostridium Difficile by PCR     Status: Abnormal   Collection Time: 10/02/14  2:43 PM  Result Value Ref Range   C difficile by pcr POSITIVE (A) NEGATIVE    Comment: CRITICAL RESULT CALLED TO, READ BACK BY AND VERIFIED WITH: SCHENOWITZ,L AT 5:30PM ON 10/02/14 BY FESTERMAN,C   Heparin level (unfractionated)     Status: None   Collection Time: 10/02/14  4:07 PM  Result Value Ref Range   Heparin Unfractionated 0.54 0.30 - 0.70 IU/mL    Comment:        IF HEPARIN RESULTS ARE BELOW EXPECTED VALUES, AND PATIENT DOSAGE HAS BEEN CONFIRMED, SUGGEST FOLLOW UP TESTING OF ANTITHROMBIN III LEVELS.   Basic metabolic panel     Status: Abnormal   Collection Time: 10/03/14  4:58 AM  Result Value Ref Range   Sodium 139 135 - 145  mmol/L   Potassium 4.3 3.5 - 5.1 mmol/L   Chloride 104 96 - 112 mmol/L   CO2 29 19 - 32 mmol/L   Glucose, Bld 91 70 - 99 mg/dL   BUN 16 6 - 23 mg/dL   Creatinine, Ser 0.73 0.50 - 1.10 mg/dL   Calcium 8.4 8.4 - 10.5 mg/dL   GFR calc non Af Amer 82 (L) >90 mL/min   GFR calc Af Amer >90 >90 mL/min    Comment: (NOTE) The eGFR has been calculated using the CKD EPI equation. This calculation has not been validated in all clinical situations. eGFR's persistently <90 mL/min signify possible Chronic Kidney Disease.    Anion gap 6 5 - 15  Heparin level (unfractionated)     Status: None   Collection Time: 10/03/14  4:58 AM  Result Value Ref Range   Heparin Unfractionated 0.40 0.30 - 0.70 IU/mL    Comment:        IF HEPARIN RESULTS ARE BELOW EXPECTED VALUES, AND PATIENT DOSAGE HAS BEEN CONFIRMED, SUGGEST FOLLOW UP TESTING OF ANTITHROMBIN III LEVELS.   CBC     Status: Abnormal   Collection Time: 10/03/14  4:58 AM  Result Value Ref Range   WBC 10.6 (H) 4.0 - 10.5 K/uL   RBC 4.30 3.87 - 5.11 MIL/uL   Hemoglobin 11.8 (L) 12.0 - 15.0 g/dL   HCT 38.4 36.0 - 46.0 %   MCV  89.3 78.0 - 100.0 fL   MCH 27.4 26.0 - 34.0 pg   MCHC 30.7 30.0 - 36.0 g/dL   RDW 14.3 11.5 - 15.5 %   Platelets 272 150 - 400 K/uL  Basic metabolic panel     Status: Abnormal   Collection Time: 10/04/14  4:54 AM  Result Value Ref Range   Sodium 139 135 - 145 mmol/L   Potassium 3.9 3.5 - 5.1 mmol/L   Chloride 103 96 - 112 mmol/L   CO2 29 19 - 32 mmol/L   Glucose, Bld 109 (H) 70 - 99 mg/dL   BUN 12 6 - 23 mg/dL   Creatinine, Ser 0.71 0.50 - 1.10 mg/dL   Calcium 8.7 8.4 - 10.5 mg/dL   GFR calc non Af Amer 82 (L) >90 mL/min   GFR calc Af Amer >90 >90 mL/min    Comment: (NOTE) The eGFR has been calculated using the CKD EPI equation. This calculation has not been validated in all clinical situations. eGFR's persistently <90 mL/min signify possible Chronic Kidney Disease.    Anion gap 7 5 - 15  CBC with  Differential/Platelet     Status: Abnormal   Collection Time: 10/04/14  4:54 AM  Result Value Ref Range   WBC 10.4 4.0 - 10.5 K/uL   RBC 4.54 3.87 - 5.11 MIL/uL   Hemoglobin 12.5 12.0 - 15.0 g/dL   HCT 39.5 36.0 - 46.0 %   MCV 87.0 78.0 - 100.0 fL   MCH 27.5 26.0 - 34.0 pg   MCHC 31.6 30.0 - 36.0 g/dL   RDW 14.3 11.5 - 15.5 %   Platelets 272 150 - 400 K/uL   Neutrophils Relative % 79 (H) 43 - 77 %   Neutro Abs 8.1 (H) 1.7 - 7.7 K/uL   Lymphocytes Relative 15 12 - 46 %   Lymphs Abs 1.6 0.7 - 4.0 K/uL   Monocytes Relative 6 3 - 12 %   Monocytes Absolute 0.6 0.1 - 1.0 K/uL   Eosinophils Relative 0 0 - 5 %   Eosinophils Absolute 0.0 0.0 - 0.7 K/uL   Basophils Relative 0 0 - 1 %   Basophils Absolute 0.0 0.0 - 0.1 K/uL  Heparin level (unfractionated)     Status: None   Collection Time: 10/04/14  5:00 AM  Result Value Ref Range   Heparin Unfractionated 0.54 0.30 - 0.70 IU/mL    Comment:        IF HEPARIN RESULTS ARE BELOW EXPECTED VALUES, AND PATIENT DOSAGE HAS BEEN CONFIRMED, SUGGEST FOLLOW UP TESTING OF ANTITHROMBIN III LEVELS.     ABGS No results for input(s): PHART, PO2ART, TCO2, HCO3 in the last 72 hours.  Invalid input(s): PCO2 CULTURES Recent Results (from the past 240 hour(s))  MRSA PCR Screening     Status: None   Collection Time: 10/01/14  3:00 AM  Result Value Ref Range Status   MRSA by PCR NEGATIVE NEGATIVE Final    Comment:        The GeneXpert MRSA Assay (FDA approved for NASAL specimens only), is one component of a comprehensive MRSA colonization surveillance program. It is not intended to diagnose MRSA infection nor to guide or monitor treatment for MRSA infections.   Clostridium Difficile by PCR     Status: Abnormal   Collection Time: 10/02/14  2:43 PM  Result Value Ref Range Status   C difficile by pcr POSITIVE (A) NEGATIVE Final    Comment: CRITICAL RESULT CALLED TO, READ BACK BY  AND VERIFIED WITH: SCHENOWITZ,L AT 5:30PM ON 10/02/14 BY  FESTERMAN,C    Studies/Results: No results found.  Medications:  Prior to Admission:  Prescriptions prior to admission  Medication Sig Dispense Refill Last Dose  . benazepril (LOTENSIN) 20 MG tablet Take 20 mg by mouth daily.   09/30/2014 at Unknown time  . clopidogrel (PLAVIX) 75 MG tablet Take 75 mg by mouth daily.   09/30/2014 at Unknown time  . donepezil (ARICEPT) 10 MG tablet Take 20 mg by mouth at bedtime.   09/29/2014  . gabapentin (NEURONTIN) 400 MG capsule Take 400 mg by mouth 4 (four) times daily.   09/30/2014 at Unknown time  . HYDROcodone-acetaminophen (NORCO) 10-325 MG per tablet Take 1 tablet by mouth 4 (four) times daily.   09/30/2014 at Unknown time  . sertraline (ZOLOFT) 100 MG tablet Take 200 mg by mouth daily.    09/30/2014 at Unknown time  . simvastatin (ZOCOR) 40 MG tablet Take 40 mg by mouth at bedtime.    09/29/2014  . zolpidem (AMBIEN) 10 MG tablet Take 5 mg by mouth at bedtime.   5 09/30/2014 at Unknown time  . doxepin (SINEQUAN) 150 MG capsule Take 150 mg by mouth at bedtime.   11 09/29/2014   Scheduled: . digoxin  0.125 mg Oral Daily  . metoprolol tartrate  50 mg Oral BID  . metroNIDAZOLE  500 mg Oral Q8H   Continuous: . heparin 1,400 Units/hr (10/04/14 0600)   QIH:[id number], LORazepam, methocarbamol **OR** methocarbamol (ROBAXIN)  IV, morphine injection  Assesment: She was admitted with a right hip fracture. She has atrial fibrillation with rapid ventricular response. She also has Clostridium difficile enteritis. Principal Problem:   Hip fracture, right Active Problems:   Anxiety state   Essential hypertension   Atrial fibrillation with RVR   Fall at home   Atrial fibrillation with rapid ventricular response   Dementia    Plan: She is  not ready for surgery yet. Continue treatments.    LOS: 4 days   Jaina Morin L 10/04/2014, 8:07 AM

## 2014-10-05 LAB — CBC
HEMATOCRIT: 40.1 % (ref 36.0–46.0)
Hemoglobin: 12.9 g/dL (ref 12.0–15.0)
MCH: 27.9 pg (ref 26.0–34.0)
MCHC: 32.2 g/dL (ref 30.0–36.0)
MCV: 86.6 fL (ref 78.0–100.0)
Platelets: 334 10*3/uL (ref 150–400)
RBC: 4.63 MIL/uL (ref 3.87–5.11)
RDW: 14.3 % (ref 11.5–15.5)
WBC: 11.1 10*3/uL — ABNORMAL HIGH (ref 4.0–10.5)

## 2014-10-05 LAB — BASIC METABOLIC PANEL
Anion gap: 4 — ABNORMAL LOW (ref 5–15)
BUN: 15 mg/dL (ref 6–23)
CHLORIDE: 104 mmol/L (ref 96–112)
CO2: 29 mmol/L (ref 19–32)
Calcium: 8.4 mg/dL (ref 8.4–10.5)
Creatinine, Ser: 0.67 mg/dL (ref 0.50–1.10)
GFR calc Af Amer: >90 mL/min (ref >90)
GFR, EST NON AFRICAN AMERICAN: 84 mL/min — AB (ref >90)
Glucose, Bld: 110 mg/dL — ABNORMAL HIGH (ref 70–99)
POTASSIUM: 3.5 mmol/L (ref 3.5–5.1)
SODIUM: 137 mmol/L (ref 135–145)

## 2014-10-05 LAB — HEPARIN LEVEL (UNFRACTIONATED): HEPARIN UNFRACTIONATED: 0.55 [IU]/mL (ref 0.30–0.70)

## 2014-10-05 MED ORDER — METOPROLOL TARTRATE 1 MG/ML IV SOLN
5.0000 mg | Freq: Four times a day (QID) | INTRAVENOUS | Status: DC
Start: 2014-10-05 — End: 2014-10-05

## 2014-10-05 MED ORDER — METOPROLOL TARTRATE 50 MG PO TABS: 50.0000 mg | ORAL_TABLET | Freq: Two times a day (BID) | ORAL | Status: DC

## 2014-10-05 MED ORDER — METOPROLOL TARTRATE 50 MG PO TABS
50.0000 mg | ORAL_TABLET | Freq: Three times a day (TID) | ORAL | Status: DC
  Administered 2014-10-05 – 2014-10-06 (×4): 50 mg via ORAL
  Filled 2014-10-05 (×4): qty 1

## 2014-10-05 NOTE — Clinical Social Work Psychosocial (Signed)
Clinical Social Work Department BRIEF PSYCHOSOCIAL ASSESSMENT 10/05/2014  Patient:  Tami [name], Tami [name]     Account Number:  [id number]     Admit date:  01/30/2012  Clinical Social Worker:  Legrand Como  Date/Time:  10/05/2014 10:44 AM  Referred by:  CSW  Date Referred:  10/05/2014 Referred for  SNF Placement   Other Referral:   Interview type:  Patient Other interview type:   Husband, Darryl Mccreedy    PSYCHOSOCIAL DATA Living Status:  HUSBAND Admitted from facility:   Level of care:   Primary support name:  Richardean Sale Primary support relationship to patient:  SPOUSE Degree of support available:   Patient reports that her husband and children are very supportive.    CURRENT CONCERNS Current Concerns  Post-Acute Placement   Other Concerns:    SOCIAL WORK ASSESSMENT / PLAN CSW met with patient who was alert and oriented.  Patient indicated that she resides in the home with her husband of 60 years, Darryl Kenton Kingfisher.  She indicated that at baseline she walks with a cane.  Patient stated that at baseline she completes her ADLs independently.  Patient described herself as "very capable."  CSW discussed the possibility of patient needing short term rehab/PT at a SNF after her surgery to repair her hip.  Patient was apprehensive about the possibility of going to SNF.  Patient called her husband, Malli Falotico, on the phone and requested that CSW discuss the situation with patient.  CSW discussed with Mr. Cratty the potential for patient needing SNF after her hip surgery.  Mr. Hunger indicated that he did not like the idea of patient going to SNF even for a short term rehab stay.  He indicated that he would rather have services in the home.  He stated that his neighbor across the street, did home health and that he wanted to see if she could assist patient.  CSW explained that patient would likely need PT to regain her abilities and patient and husband continued to state that they wanted  services in the home. CSW did leave a SNF list in patient's room for them to refer to should they change their minds.   Assessment/plan status:  Information/Referral to [employer] Other assessment/ plan:   Information/referral to community resources:    PATIENT'S/FAMILY'S RESPONSE TO PLAN OF CARE: Patient and husband are not agreeable to SNF at this point. Patient and husband indicated that they would rather have services in the home.    Ambrose Pancoast, [place]

## 2014-10-05 NOTE — Progress Notes (Signed)
[place] for Heparin Indication: atrial fibrillation  Allergies  Allergen Reactions  . Penicillins Other (See Comments)    Child hood allergy(unknown reaction)   Patient Measurements: Height: 5\' 9"  (175.3 cm) Weight: 199 lb 4.7 oz (90.4 kg) IBW/kg (Calculated) : 66.2 Heparin Dosing Weight: 85kg  Vital Signs: Temp: 98.3 F (36.8 C) (02/09 0730) Temp Source: Oral (02/09 0730) BP: 145/82 mmHg (02/09 0400) Pulse Rate: 88 (02/09 0400)  Labs:  Recent Labs  10/03/14 0458 10/04/14 0454 10/04/14 0500 10/05/14 0504  HGB 11.8* 12.5  --  12.9  HCT 38.4 39.5  --  40.1  PLT 272 272  --  334  HEPARINUNFRC 0.40  --  0.54 0.55  CREATININE 0.73 0.71  --  0.67   Estimated Creatinine Clearance: 72.8 mL/min (by C-G formula based on Cr of 0.67).  Medical History: Past Medical History  Diagnosis Date  . Arthritis   . Cancer Cervical  . Hypertension   . Stroke   . Anxiety    Medications:  Scheduled:  . digoxin  0.125 mg Oral Daily  . metoprolol tartrate  50 mg Oral 3 times per day  . metroNIDAZOLE  500 mg Oral Q8H   Assessment: 37 yoF admitted with hip fx found to be in Afib with RVR.  Asked to initiate heparin gtt for CHADS2Vasc score=6 while surgery plans pending.   CBC reviewed.   Heparin at goal.  No bleeding noted.   Goal of Therapy:  Heparin level 0.3-0.7 units/ml Monitor platelets by anticoagulation protocol: Yes   Plan:  Continue heparin infusion at 1400 units/hr Daily heparin level & CBC while on heparin F/U surgical plans & plans for long-term [facility], Tessi Eustache A 10/05/2014,10:39 AM

## 2014-10-05 NOTE — Progress Notes (Signed)
Primary cardiologist: Dr. Kate Sable  Seen for followup: Atrial fibrillation  Subjective:    No specific complaint of chest pain or palpitations, does have left hip pain intermittently.  Objective:   Temp:  [97 F (36.1 C)-98.4 F (36.9 C)] 98.3 F (36.8 C) (02/09 0000) Pulse Rate:  [34-154] 88 (02/09 0400) Resp:  [16-33] 20 (02/09 0400) BP: (100-171)/(66-130) 145/82 mmHg (02/09 0400) SpO2:  [93 %-100 %] 97 % (02/09 0400) Last BM Date: 10/02/14  Filed Weights   10/02/14 0400 10/03/14 0500 10/04/14 0500  Weight: 204 lb 2.3 oz (92.6 kg) 202 lb 9.6 oz (91.9 kg) 199 lb 4.7 oz (90.4 kg)    Intake/Output Summary (Last 24 hours) at 10/05/14 0850 Last data filed at 10/05/14 0400  Gross per 24 hour  Intake   1538 ml  Output      0 ml  Net   1538 ml    Telemetry: Persistent atrial fibrillation.  Exam:  General: No distress.  Lungs: Decreased breath sounds, nonlabored.  Cardiac: Irregularly irregular.  Extremities: No pitting edema.  Lab Results:  Basic Metabolic Panel:  Recent Labs Lab 09/30/14 2332  10/03/14 0458 10/04/14 0454 10/05/14 0504  NA 139  < > 139 139 137  K 4.3  < > 4.3 3.9 3.5  CL 105  < > 104 103 104  CO2 30  < > 29 29 29   GLUCOSE 109*  < > 91 109* 110*  BUN 15  < > 16 12 15   CREATININE 1.01  < > 0.73 0.71 0.67  CALCIUM 8.5  < > 8.4 8.7 8.4  MG 1.9  --   --   --   --   < > = values in this interval not displayed.   CBC:  Recent Labs Lab 10/03/14 0458 10/04/14 0454 10/05/14 0504  WBC 10.6* 10.4 11.1*  HGB 11.8* 12.5 12.9  HCT 38.4 39.5 40.1  MCV 89.3 87.0 86.6  PLT 272 272 334    Echocardiogram 10/01/14: Study Conclusions  - Procedure narrative: Transthoracic echocardiography. Image quality was suboptimal. The study was technically difficult, as a result of poor sound wave transmission, restricted patient mobility, and body habitus. - Left ventricle: The cavity size was normal. Systolic function was normal.  The estimated ejection fraction was in the range of 60% to 65%. Images were inadequate for LV wall motion assessment. The study was not technically sufficient to allow evaluation of LV diastolic dysfunction due to atrial fibrillation. There was no evidence of elevated ventricular filling pressure by Doppler parameters. Moderate to severe concentric left ventricular hypertrophy. - Aortic valve: Mildly calcified annulus. Moderately thickened, mildly calcified leaflets. There was no stenosis. There was trivial regurgitation. Mean gradient (S): 3 mm Hg. - Aorta: Mild ascending aortic dilatation, maximum diameter 3.74 cm. Mild aortic root dilatation. Aortic root dimension: 42 mm (ED). - Mitral valve: Mildly calcified annulus. Normal thickness leaflets . There was mild to moderate regurgitation. - Left atrium: The atrium was moderately to severely dilated. - Right atrium: The atrium was mildly dilated. - Tricuspid valve: There was mild-moderate regurgitation. - Pulmonary arteries: PA peak pressure: 40 mm Hg (S). Mildly elevated pulmonary pressures. - Inferior vena cava: The vessel was dilated. The respirophasic diameter changes were blunted (< 50%), consistent with elevated central venous pressure.   Medications:   Scheduled Medications: . digoxin  0.125 mg Oral Daily  . metoprolol tartrate  50 mg Oral BID  . metroNIDAZOLE  500 mg Oral Q8H  Infusions: . heparin 1,400 Units/hr (10/05/14 0400)    PRN Medications: HYDROcodone-acetaminophen, LORazepam, methocarbamol **OR** methocarbamol (ROBAXIN)  IV, morphine injection   Assessment:   1. Newly diagnosed persistent atrial fibrillation, CHADSVASC score 6. At this point patient on IV heparin (in anticipation of hip surgery), and oral metoprolol. Can consider transition to oral anticoagulant later.  2. Essential hypertension. Systolic blood pressure ranging 130s to 140s most recently.  3. Right hip  fracture following mechanical fall.  4. Clostridium difficile colitis.   Plan/Discussion:    Lopressor changed to 50 mg twice daily by Dr. Cindie Laroche, follow heart rate control response. Otherwise continue heparin.   Satira Sark, M.D., F.A.C.C.

## 2014-10-05 NOTE — Progress Notes (Signed)
Subjective: She was admitted with hip fracture. She developed atrial fibrillation with rapid ventricular response. She has been hypertensive. She also has Clostridium difficile colitis.  Objective: Vital signs in last 24 hours: Temp:  [97 F (36.1 C)-98.4 F (36.9 C)] 98.3 F (36.8 C) (02/09 0000) Pulse Rate:  [34-154] 88 (02/09 0400) Resp:  [16-33] 20 (02/09 0400) BP: (100-171)/(66-130) 145/82 mmHg (02/09 0400) SpO2:  [93 %-100 %] 97 % (02/09 0400) Weight change:  Last BM Date: 10/02/14  Intake/Output from previous day: 02/08 0701 - 02/09 0700 In: 1858 [P.O.:1100; I.V.:308] Out: -   PHYSICAL EXAM General appearance: alert and Confused Resp: clear to auscultation bilaterally Cardio: irregularly irregular rhythm GI: soft, non-tender; bowel sounds normal; no masses,  no organomegaly Extremities: Hip fracture  Lab Results:  Results for orders placed or performed during the hospital encounter of 09/30/14 (from the past 48 hour(s))  Basic metabolic panel     Status: Abnormal   Collection Time: 10/04/14  4:54 AM  Result Value Ref Range   Sodium 139 135 - 145 mmol/L   Potassium 3.9 3.5 - 5.1 mmol/L   Chloride 103 96 - 112 mmol/L   CO2 29 19 - 32 mmol/L   Glucose, Bld 109 (H) 70 - 99 mg/dL   BUN 12 6 - 23 mg/dL   Creatinine, Ser 0.71 0.50 - 1.10 mg/dL   Calcium 8.7 8.4 - 10.5 mg/dL   GFR calc non Af Amer 82 (L) >90 mL/min   GFR calc Af Amer >90 >90 mL/min    Comment: (NOTE) The eGFR has been calculated using the CKD EPI equation. This calculation has not been validated in all clinical situations. eGFR's persistently <90 mL/min signify possible Chronic Kidney Disease.    Anion gap 7 5 - 15  CBC with Differential/Platelet     Status: Abnormal   Collection Time: 10/04/14  4:54 AM  Result Value Ref Range   WBC 10.4 4.0 - 10.5 K/uL   RBC 4.54 3.87 - 5.11 MIL/uL   Hemoglobin 12.5 12.0 - 15.0 g/dL   HCT 39.5 36.0 - 46.0 %   MCV 87.0 78.0 - 100.0 fL   MCH 27.5 26.0 - 34.0  pg   MCHC 31.6 30.0 - 36.0 g/dL   RDW 14.3 11.5 - 15.5 %   Platelets 272 150 - 400 K/uL   Neutrophils Relative % 79 (H) 43 - 77 %   Neutro Abs 8.1 (H) 1.7 - 7.7 K/uL   Lymphocytes Relative 15 12 - 46 %   Lymphs Abs 1.6 0.7 - 4.0 K/uL   Monocytes Relative 6 3 - 12 %   Monocytes Absolute 0.6 0.1 - 1.0 K/uL   Eosinophils Relative 0 0 - 5 %   Eosinophils Absolute 0.0 0.0 - 0.7 K/uL   Basophils Relative 0 0 - 1 %   Basophils Absolute 0.0 0.0 - 0.1 K/uL  Heparin level (unfractionated)     Status: None   Collection Time: 10/04/14  5:00 AM  Result Value Ref Range   Heparin Unfractionated 0.54 0.30 - 0.70 IU/mL    Comment:        IF HEPARIN RESULTS ARE BELOW EXPECTED VALUES, AND PATIENT DOSAGE HAS BEEN CONFIRMED, SUGGEST FOLLOW UP TESTING OF ANTITHROMBIN III LEVELS.   Heparin level (unfractionated)     Status: None   Collection Time: 10/05/14  5:04 AM  Result Value Ref Range   Heparin Unfractionated 0.55 0.30 - 0.70 IU/mL    Comment:  IF HEPARIN RESULTS ARE BELOW EXPECTED VALUES, AND PATIENT DOSAGE HAS BEEN CONFIRMED, SUGGEST FOLLOW UP TESTING OF ANTITHROMBIN III LEVELS.   CBC     Status: Abnormal   Collection Time: 10/05/14  5:04 AM  Result Value Ref Range   WBC 11.1 (H) 4.0 - 10.5 K/uL   RBC 4.63 3.87 - 5.11 MIL/uL   Hemoglobin 12.9 12.0 - 15.0 g/dL   HCT 40.1 36.0 - 46.0 %   MCV 86.6 78.0 - 100.0 fL   MCH 27.9 26.0 - 34.0 pg   MCHC 32.2 30.0 - 36.0 g/dL   RDW 14.3 11.5 - 15.5 %   Platelets 334 150 - 400 K/uL  Basic metabolic panel     Status: Abnormal   Collection Time: 10/05/14  5:04 AM  Result Value Ref Range   Sodium 137 135 - 145 mmol/L   Potassium 3.5 3.5 - 5.1 mmol/L   Chloride 104 96 - 112 mmol/L   CO2 29 19 - 32 mmol/L   Glucose, Bld 110 (H) 70 - 99 mg/dL   BUN 15 6 - 23 mg/dL   Creatinine, Ser 0.67 0.50 - 1.10 mg/dL   Calcium 8.4 8.4 - 10.5 mg/dL   GFR calc non Af Amer 84 (L) >90 mL/min   GFR calc Af Amer >90 >90 mL/min    Comment: (NOTE) The  eGFR has been calculated using the CKD EPI equation. This calculation has not been validated in all clinical situations. eGFR's persistently <90 mL/min signify possible Chronic Kidney Disease.    Anion gap 4 (L) 5 - 15    ABGS No results for input(s): PHART, PO2ART, TCO2, HCO3 in the last 72 hours.  Invalid input(s): PCO2 CULTURES Recent Results (from the past 240 hour(s))  MRSA PCR Screening     Status: None   Collection Time: 10/01/14  3:00 AM  Result Value Ref Range Status   MRSA by PCR NEGATIVE NEGATIVE Final    Comment:        The GeneXpert MRSA Assay (FDA approved for NASAL specimens only), is one component of a comprehensive MRSA colonization surveillance program. It is not intended to diagnose MRSA infection nor to guide or monitor treatment for MRSA infections.   Clostridium Difficile by PCR     Status: Abnormal   Collection Time: 10/02/14  2:43 PM  Result Value Ref Range Status   C difficile by pcr POSITIVE (A) NEGATIVE Final    Comment: CRITICAL RESULT CALLED TO, READ BACK BY AND VERIFIED WITH: SCHENOWITZ,L AT 5:30PM ON 10/02/14 BY FESTERMAN,C    Studies/Results: No results found.  Medications:  Prior to Admission:  Prescriptions prior to admission  Medication Sig Dispense Refill Last Dose  . benazepril (LOTENSIN) 20 MG tablet Take 20 mg by mouth daily.   09/30/2014 at Unknown time  . clopidogrel (PLAVIX) 75 MG tablet Take 75 mg by mouth daily.   09/30/2014 at Unknown time  . donepezil (ARICEPT) 10 MG tablet Take 20 mg by mouth at bedtime.   09/29/2014  . gabapentin (NEURONTIN) 400 MG capsule Take 400 mg by mouth 4 (four) times daily.   09/30/2014 at Unknown time  . HYDROcodone-acetaminophen (NORCO) 10-325 MG per tablet Take 1 tablet by mouth 4 (four) times daily.   09/30/2014 at Unknown time  . sertraline (ZOLOFT) 100 MG tablet Take 200 mg by mouth daily.    09/30/2014 at Unknown time  . simvastatin (ZOCOR) 40 MG tablet Take 40 mg by mouth at bedtime.    09/29/2014  .  zolpidem (AMBIEN) 10 MG tablet Take 5 mg by mouth at bedtime.   5 09/30/2014 at Unknown time  . doxepin (SINEQUAN) 150 MG capsule Take 150 mg by mouth at bedtime.   11 09/29/2014   Scheduled: . digoxin  0.125 mg Oral Daily  . metoprolol tartrate  50 mg Oral BID  . metroNIDAZOLE  500 mg Oral Q8H   Continuous: . heparin 1,400 Units/hr (10/05/14 0400)   IRS:[id number], LORazepam, methocarbamol **OR** methocarbamol (ROBAXIN)  IV, morphine injection  Assesment: She has a fracture of her right hip. She has atrial fibrillation with rapid ventricular response and it seems that she continues to have elevated heart rate periodically. Her blood pressure is better Principal Problem:   Hip fracture, right Active Problems:   Anxiety state   Essential hypertension   Atrial fibrillation with RVR   Fall at home   Atrial fibrillation with rapid ventricular response   Dementia    Plan: Continue treatments. I'd like to see her heart rate better controlled before we approve her for surgery.    LOS: 5 days   Tywanda Rice L 10/05/2014, 8:58 AM

## 2014-10-06 LAB — CBC
HCT: 40.9 % (ref 36.0–46.0)
HEMOGLOBIN: 12.7 g/dL (ref 12.0–15.0)
MCH: 27.3 pg (ref 26.0–34.0)
MCHC: 31.1 g/dL (ref 30.0–36.0)
MCV: 88 fL (ref 78.0–100.0)
Platelets: 293 10*3/uL (ref 150–400)
RBC: 4.65 MIL/uL (ref 3.87–5.11)
RDW: 14.5 % (ref 11.5–15.5)
WBC: 8.4 10*3/uL (ref 4.0–10.5)

## 2014-10-06 LAB — HEPARIN LEVEL (UNFRACTIONATED): HEPARIN UNFRACTIONATED: 0.53 [IU]/mL (ref 0.30–0.70)

## 2014-10-06 MED ORDER — METOPROLOL TARTRATE 50 MG PO TABS
50.0000 mg | ORAL_TABLET | Freq: Four times a day (QID) | ORAL | Status: DC
  Administered 2014-10-06 – 2014-10-11 (×20): 50 mg via ORAL
  Filled 2014-10-06 (×20): qty 1

## 2014-10-06 NOTE — Progress Notes (Signed)
Primary cardiologist: Dr. Kate Sable  Seen for followup: Atrial fibrillation  Subjective:    Comfortable this morning, watching TV. No hip pain, no palpitations.  Objective:   Temp:  [96.1 F (35.6 C)-98.6 F (37 C)] 96.1 F (35.6 C) (02/10 0757) Pulse Rate:  [47-127] 47 (02/10 0700) Resp:  [12-23] 12 (02/10 0748) BP: (120-152)/(52-109) 142/92 mmHg (02/10 0700) SpO2:  [90 %-100 %] 99 % (02/10 0748) FiO2 (%):  [32 %] 32 % (02/10 0400) Weight:  [197 lb 8.5 oz (89.6 kg)] 197 lb 8.5 oz (89.6 kg) (02/10 0500) Last BM Date: 10/05/14  Filed Weights   10/03/14 0500 10/04/14 0500 10/06/14 0500  Weight: 202 lb 9.6 oz (91.9 kg) 199 lb 4.7 oz (90.4 kg) 197 lb 8.5 oz (89.6 kg)    Intake/Output Summary (Last 24 hours) at 10/06/14 0804 Last data filed at 10/06/14 0700  Gross per 24 hour  Intake    844 ml  Output   2325 ml  Net  -1481 ml    Telemetry: Persistent atrial fibrillation.  Exam:  General: No distress.  Lungs: Decreased breath sounds, nonlabored.  Cardiac: Irregularly irregular.  Extremities: No pitting edema.  Lab Results:  Basic Metabolic Panel:  Recent Labs Lab 09/30/14 2332  10/03/14 0458 10/04/14 0454 10/05/14 0504  NA 139  < > 139 139 137  K 4.3  < > 4.3 3.9 3.5  CL 105  < > 104 103 104  CO2 30  < > 29 29 29   GLUCOSE 109*  < > 91 109* 110*  BUN 15  < > 16 12 15   CREATININE 1.01  < > 0.73 0.71 0.67  CALCIUM 8.5  < > 8.4 8.7 8.4  MG 1.9  --   --   --   --   < > = values in this interval not displayed.   CBC:  Recent Labs Lab 10/04/14 0454 10/05/14 0504 10/06/14 0438  WBC 10.4 11.1* 8.4  HGB 12.5 12.9 12.7  HCT 39.5 40.1 40.9  MCV 87.0 86.6 88.0  PLT 272 334 293    Echocardiogram 10/01/14: Study Conclusions  - Procedure narrative: Transthoracic echocardiography. Image quality was suboptimal. The study was technically difficult, as a result of poor sound wave transmission, restricted patient mobility, and body  habitus. - Left ventricle: The cavity size was normal. Systolic function was normal. The estimated ejection fraction was in the range of 60% to 65%. Images were inadequate for LV wall motion assessment. The study was not technically sufficient to allow evaluation of LV diastolic dysfunction due to atrial fibrillation. There was no evidence of elevated ventricular filling pressure by Doppler parameters. Moderate to severe concentric left ventricular hypertrophy. - Aortic valve: Mildly calcified annulus. Moderately thickened, mildly calcified leaflets. There was no stenosis. There was trivial regurgitation. Mean gradient (S): 3 mm Hg. - Aorta: Mild ascending aortic dilatation, maximum diameter 3.74 cm. Mild aortic root dilatation. Aortic root dimension: 42 mm (ED). - Mitral valve: Mildly calcified annulus. Normal thickness leaflets . There was mild to moderate regurgitation. - Left atrium: The atrium was moderately to severely dilated. - Right atrium: The atrium was mildly dilated. - Tricuspid valve: There was mild-moderate regurgitation. - Pulmonary arteries: PA peak pressure: 40 mm Hg (S). Mildly elevated pulmonary pressures. - Inferior vena cava: The vessel was dilated. The respirophasic diameter changes were blunted (< 50%), consistent with elevated central venous pressure.   Medications:   Scheduled Medications: . digoxin  0.125 mg Oral Daily  .  metoprolol tartrate  50 mg Oral 4 times per day  . metroNIDAZOLE  500 mg Oral Q8H    Infusions: . heparin 1,400 Units/hr (10/06/14 0700)    PRN Medications: HYDROcodone-acetaminophen, LORazepam, methocarbamol **OR** methocarbamol (ROBAXIN)  IV, morphine injection   Assessment:   1. Newly diagnosed persistent atrial fibrillation, CHADSVASC score 6. At this point patient on IV heparin (in anticipation of hip surgery), and oral metoprolol. Can consider transition to oral anticoagulant later.  2.  Essential hypertension.  3. Right hip fracture following mechanical fall.  4. Clostridium difficile colitis.   Plan/Discussion:    Increasing Lopressor to 50 mg every 6 hours today to try and obtain better heart rate control. Hopefully she will be prepared to go to the OR within the next 24 hours. Discussed with Dr. Luan Pulling.   Satira Sark, M.D., F.A.C.C.

## 2014-10-06 NOTE — Progress Notes (Signed)
[place] for Heparin Indication: atrial fibrillation  Allergies  Allergen Reactions  . Penicillins Other (See Comments)    Child hood allergy(unknown reaction)   Patient Measurements: Height: 5\' 9"  (175.3 cm) Weight: 197 lb 8.5 oz (89.6 kg) IBW/kg (Calculated) : 66.2 Heparin Dosing Weight: 85kg  Vital Signs: Temp: 96.1 F (35.6 C) (02/10 0757) Temp Source: Oral (02/10 0757) BP: 141/93 mmHg (02/10 1000) Pulse Rate: 93 (02/10 1000)  Labs:  Recent Labs  10/04/14 0454 10/04/14 0500 10/05/14 0504 10/06/14 0438  HGB 12.5  --  12.9 12.7  HCT 39.5  --  40.1 40.9  PLT 272  --  334 293  HEPARINUNFRC  --  0.54 0.55 0.53  CREATININE 0.71  --  0.67  --    Estimated Creatinine Clearance: 72.5 mL/min (by C-G formula based on Cr of 0.67).  Medical History: Past Medical History  Diagnosis Date  . Arthritis   . Cancer Cervical  . Hypertension   . Stroke   . Anxiety    Medications:  Scheduled:  . digoxin  0.125 mg Oral Daily  . metoprolol tartrate  50 mg Oral 4 times per day  . metroNIDAZOLE  500 mg Oral Q8H   Assessment: 49 yoF admitted with hip fx found to be in Afib with RVR.  Asked to initiate heparin gtt for CHADS2Vasc score=6 while surgery plans pending.   CBC reviewed.   Heparin at goal.  No bleeding noted.   Goal of Therapy:  Heparin level 0.3-0.7 units/ml Monitor platelets by anticoagulation protocol: Yes   Plan:  Continue heparin infusion at 1400 units/hr Daily heparin level & CBC while on heparin F/U surgical plans & plans for long-term [facility]  Tereka Thorley A 10/06/2014,10:35 AM

## 2014-10-06 NOTE — Care Management Utilization Note (Signed)
UR completed 

## 2014-10-06 NOTE — Progress Notes (Signed)
Subjective: She is awake and alert and pleasantly confused. She has no complaints. Although she has C. Difficile she has very little diarrhea. Her heart rate is still running 100+ or -10  Objective: Vital signs in last 24 hours: Temp:  [96.1 F (35.6 C)-98.6 F (37 C)] 96.1 F (35.6 C) (02/10 0757) Pulse Rate:  [47-127] 47 (02/10 0700) Resp:  [12-23] 12 (02/10 0748) BP: (120-152)/(52-109) 142/92 mmHg (02/10 0700) SpO2:  [90 %-100 %] 99 % (02/10 0748) FiO2 (%):  [32 %] 32 % (02/10 0400) Weight:  [89.6 kg (197 lb 8.5 oz)] 89.6 kg (197 lb 8.5 oz) (02/10 0500) Weight change:  Last BM Date: 10/05/14  Intake/Output from previous day: 02/09 0701 - 02/10 0700 In: 844 [P.O.:480; I.V.:364] Out: 2325 [Urine:2325]  PHYSICAL EXAM General appearance: alert, cooperative and Confused Resp: clear to auscultation bilaterally Cardio: She still shows atrial fibrillation with a somewhat variable rate but generally around 100 GI: soft, non-tender; bowel sounds normal; no masses,  no organomegaly Extremities: She had hip fracture on the right  Lab Results:  Results for orders placed or performed during the hospital encounter of 09/30/14 (from the past 48 hour(s))  Heparin level (unfractionated)     Status: None   Collection Time: 10/05/14  5:04 AM  Result Value Ref Range   Heparin Unfractionated 0.55 0.30 - 0.70 IU/mL    Comment:        IF HEPARIN RESULTS ARE BELOW EXPECTED VALUES, AND PATIENT DOSAGE HAS BEEN CONFIRMED, SUGGEST FOLLOW UP TESTING OF ANTITHROMBIN III LEVELS.   CBC     Status: Abnormal   Collection Time: 10/05/14  5:04 AM  Result Value Ref Range   WBC 11.1 (H) 4.0 - 10.5 K/uL   RBC 4.63 3.87 - 5.11 MIL/uL   Hemoglobin 12.9 12.0 - 15.0 g/dL   HCT 40.1 36.0 - 46.0 %   MCV 86.6 78.0 - 100.0 fL   MCH 27.9 26.0 - 34.0 pg   MCHC 32.2 30.0 - 36.0 g/dL   RDW 14.3 11.5 - 15.5 %   Platelets 334 150 - 400 K/uL  Basic metabolic panel     Status: Abnormal   Collection Time:  10/05/14  5:04 AM  Result Value Ref Range   Sodium 137 135 - 145 mmol/L   Potassium 3.5 3.5 - 5.1 mmol/L   Chloride 104 96 - 112 mmol/L   CO2 29 19 - 32 mmol/L   Glucose, Bld 110 (H) 70 - 99 mg/dL   BUN 15 6 - 23 mg/dL   Creatinine, Ser 0.67 0.50 - 1.10 mg/dL   Calcium 8.4 8.4 - 10.5 mg/dL   GFR calc non Af Amer 84 (L) >90 mL/min   GFR calc Af Amer >90 >90 mL/min    Comment: (NOTE) The eGFR has been calculated using the CKD EPI equation. This calculation has not been validated in all clinical situations. eGFR's persistently <90 mL/min signify possible Chronic Kidney Disease.    Anion gap 4 (L) 5 - 15  Heparin level (unfractionated)     Status: None   Collection Time: 10/06/14  4:38 AM  Result Value Ref Range   Heparin Unfractionated 0.53 0.30 - 0.70 IU/mL    Comment:        IF HEPARIN RESULTS ARE BELOW EXPECTED VALUES, AND PATIENT DOSAGE HAS BEEN CONFIRMED, SUGGEST FOLLOW UP TESTING OF ANTITHROMBIN III LEVELS.   CBC     Status: None   Collection Time: 10/06/14  4:38 AM  Result Value Ref  Range   WBC 8.4 4.0 - 10.5 K/uL   RBC 4.65 3.87 - 5.11 MIL/uL   Hemoglobin 12.7 12.0 - 15.0 g/dL   HCT 40.9 36.0 - 46.0 %   MCV 88.0 78.0 - 100.0 fL   MCH 27.3 26.0 - 34.0 pg   MCHC 31.1 30.0 - 36.0 g/dL   RDW 14.5 11.5 - 15.5 %   Platelets 293 150 - 400 K/uL    ABGS No results for input(s): PHART, PO2ART, TCO2, HCO3 in the last 72 hours.  Invalid input(s): PCO2 CULTURES Recent Results (from the past 240 hour(s))  MRSA PCR Screening     Status: None   Collection Time: 10/01/14  3:00 AM  Result Value Ref Range Status   MRSA by PCR NEGATIVE NEGATIVE Final    Comment:        The GeneXpert MRSA Assay (FDA approved for NASAL specimens only), is one component of a comprehensive MRSA colonization surveillance program. It is not intended to diagnose MRSA infection nor to guide or monitor treatment for MRSA infections.   Clostridium Difficile by PCR     Status: Abnormal    Collection Time: 10/02/14  2:43 PM  Result Value Ref Range Status   C difficile by pcr POSITIVE (A) NEGATIVE Final    Comment: CRITICAL RESULT CALLED TO, READ BACK BY AND VERIFIED WITH: SCHENOWITZ,L AT 5:30PM ON 10/02/14 BY FESTERMAN,C    Studies/Results: No results found.  Medications:  Prior to Admission:  Prescriptions prior to admission  Medication Sig Dispense Refill Last Dose  . benazepril (LOTENSIN) 20 MG tablet Take 20 mg by mouth daily.   09/30/2014 at Unknown time  . clopidogrel (PLAVIX) 75 MG tablet Take 75 mg by mouth daily.   09/30/2014 at Unknown time  . donepezil (ARICEPT) 10 MG tablet Take 20 mg by mouth at bedtime.   09/29/2014  . gabapentin (NEURONTIN) 400 MG capsule Take 400 mg by mouth 4 (four) times daily.   09/30/2014 at Unknown time  . HYDROcodone-acetaminophen (NORCO) 10-325 MG per tablet Take 1 tablet by mouth 4 (four) times daily.   09/30/2014 at Unknown time  . sertraline (ZOLOFT) 100 MG tablet Take 200 mg by mouth daily.    09/30/2014 at Unknown time  . simvastatin (ZOCOR) 40 MG tablet Take 40 mg by mouth at bedtime.    09/29/2014  . zolpidem (AMBIEN) 10 MG tablet Take 5 mg by mouth at bedtime.   5 09/30/2014 at Unknown time  . doxepin (SINEQUAN) 150 MG capsule Take 150 mg by mouth at bedtime.   11 09/29/2014   Scheduled: . digoxin  0.125 mg Oral Daily  . metoprolol tartrate  50 mg Oral 4 times per day  . metroNIDAZOLE  500 mg Oral Q8H   Continuous: . heparin 1,400 Units/hr (10/06/14 0700)   ULA:[id number], LORazepam, methocarbamol **OR** methocarbamol (ROBAXIN)  IV, morphine injection  Assesment: She has a hip fracture. She developed atrial fibrillation with rapid ventricular response and she is currently on 50 mg of metoprolol every 8 hours but still has a somewhat elevated heart rate. She had trouble with diltiazem. She is maintaining good blood pressure with metoprolol so far. She has Clostridium difficile colitis but very little diarrhea. She has  dementia and is pleasantly confused. Principal Problem:   Hip fracture, right Active Problems:   Anxiety state   Essential hypertension   Atrial fibrillation with RVR   Fall at home   Atrial fibrillation with rapid ventricular response   Dementia  Plan: Change her metoprolol to 50 mg every 6 hours. I will go ahead and discuss with Dr. Luna Glasgow the orthopedist to see if she could potentially have surgery tomorrow and I will also discuss with anesthesia.    LOS: 5 days   Benny Deutschman L 10/06/2014, 8:02 AM

## 2014-10-07 DIAGNOSIS — A0472 Enterocolitis due to Clostridium difficile, not specified as recurrent: Secondary | ICD-10-CM | POA: Diagnosis present

## 2014-10-07 LAB — CBC
HEMATOCRIT: 39.8 % (ref 36.0–46.0)
Hemoglobin: 12.4 g/dL (ref 12.0–15.0)
MCH: 27.3 pg (ref 26.0–34.0)
MCHC: 31.2 g/dL (ref 30.0–36.0)
MCV: 87.5 fL (ref 78.0–100.0)
Platelets: 280 10*3/uL (ref 150–400)
RBC: 4.55 MIL/uL (ref 3.87–5.11)
RDW: 14.5 % (ref 11.5–15.5)
WBC: 9.3 10*3/uL (ref 4.0–10.5)

## 2014-10-07 LAB — BASIC METABOLIC PANEL
Anion gap: 6 (ref 5–15)
BUN: 13 mg/dL (ref 6–23)
CO2: 33 mmol/L — ABNORMAL HIGH (ref 19–32)
Calcium: 8.5 mg/dL (ref 8.4–10.5)
Chloride: 99 mmol/L (ref 96–112)
Creatinine, Ser: 0.71 mg/dL (ref 0.50–1.10)
GFR calc Af Amer: >90 mL/min (ref >90)
GFR calc non Af Amer: 82 mL/min — ABNORMAL LOW (ref >90)
GLUCOSE: 87 mg/dL (ref 70–99)
Potassium: 3.4 mmol/L — ABNORMAL LOW (ref 3.5–5.1)
Sodium: 138 mmol/L (ref 135–145)

## 2014-10-07 LAB — HEPARIN LEVEL (UNFRACTIONATED): Heparin Unfractionated: 0.59 IU/mL (ref 0.30–0.70)

## 2014-10-07 MED ORDER — POTASSIUM CHLORIDE CRYS ER 20 MEQ PO TBCR
40.0000 meq | EXTENDED_RELEASE_TABLET | Freq: Once | ORAL | Status: AC
  Administered 2014-10-07: 40 meq via ORAL
  Filled 2014-10-07: qty 2

## 2014-10-07 NOTE — Progress Notes (Signed)
Subjective: She remains pleasantly confused. She is now on 50 mg of Lopressor every 6 hours and her heart rate is generally now between 70 and 100. Her blood pressure is okay in the 120s  Objective: Vital signs in last 24 hours: Temp:  [97.7 F (36.5 C)-98.7 F (37.1 C)] 97.8 F (36.6 C) (02/11 0757) Pulse Rate:  [65-134] 91 (02/11 0800) Resp:  [12-24] 18 (02/11 0800) BP: (110-161)/(50-108) 121/72 mmHg (02/11 0800) SpO2:  [90 %-99 %] 99 % (02/11 0800) Weight:  [92.5 kg (203 lb 14.8 oz)] 92.5 kg (203 lb 14.8 oz) (02/11 0500) Weight change: 2.9 kg (6 lb 6.3 oz) Last BM Date: 10/05/14  Intake/Output from previous day: 02/10 0701 - 02/11 0700 In: 336 [I.V.:336] Out: 1425 [Urine:1425]  PHYSICAL EXAM General appearance: alert, cooperative and Confused Resp: clear to auscultation bilaterally Cardio: regularly irregular rhythm GI: soft, non-tender; bowel sounds normal; no masses,  no organomegaly Extremities: Right hip fracture  Lab Results:  Results for orders placed or performed during the hospital encounter of 09/30/14 (from the past 48 hour(s))  Heparin level (unfractionated)     Status: None   Collection Time: 10/06/14  4:38 AM  Result Value Ref Range   Heparin Unfractionated 0.53 0.30 - 0.70 IU/mL    Comment:        IF HEPARIN RESULTS ARE BELOW EXPECTED VALUES, AND PATIENT DOSAGE HAS BEEN CONFIRMED, SUGGEST FOLLOW UP TESTING OF ANTITHROMBIN III LEVELS.   CBC     Status: None   Collection Time: 10/06/14  4:38 AM  Result Value Ref Range   WBC 8.4 4.0 - 10.5 K/uL   RBC 4.65 3.87 - 5.11 MIL/uL   Hemoglobin 12.7 12.0 - 15.0 g/dL   HCT 40.9 36.0 - 46.0 %   MCV 88.0 78.0 - 100.0 fL   MCH 27.3 26.0 - 34.0 pg   MCHC 31.1 30.0 - 36.0 g/dL   RDW 14.5 11.5 - 15.5 %   Platelets 293 150 - 400 K/uL  Heparin level (unfractionated)     Status: None   Collection Time: 10/07/14  4:18 AM  Result Value Ref Range   Heparin Unfractionated 0.59 0.30 - 0.70 IU/mL    Comment:         IF HEPARIN RESULTS ARE BELOW EXPECTED VALUES, AND PATIENT DOSAGE HAS BEEN CONFIRMED, SUGGEST FOLLOW UP TESTING OF ANTITHROMBIN III LEVELS.   CBC     Status: None   Collection Time: 10/07/14  4:18 AM  Result Value Ref Range   WBC 9.3 4.0 - 10.5 K/uL   RBC 4.55 3.87 - 5.11 MIL/uL   Hemoglobin 12.4 12.0 - 15.0 g/dL   HCT 39.8 36.0 - 46.0 %   MCV 87.5 78.0 - 100.0 fL   MCH 27.3 26.0 - 34.0 pg   MCHC 31.2 30.0 - 36.0 g/dL   RDW 14.5 11.5 - 15.5 %   Platelets 280 150 - 400 K/uL    ABGS No results for input(s): PHART, PO2ART, TCO2, HCO3 in the last 72 hours.  Invalid input(s): PCO2 CULTURES Recent Results (from the past 240 hour(s))  MRSA PCR Screening     Status: None   Collection Time: 10/01/14  3:00 AM  Result Value Ref Range Status   MRSA by PCR NEGATIVE NEGATIVE Final    Comment:        The GeneXpert MRSA Assay (FDA approved for NASAL specimens only), is one component of a comprehensive MRSA colonization surveillance program. It is not intended to diagnose MRSA  infection nor to guide or monitor treatment for MRSA infections.   Clostridium Difficile by PCR     Status: Abnormal   Collection Time: 10/02/14  2:43 PM  Result Value Ref Range Status   C difficile by pcr POSITIVE (A) NEGATIVE Final    Comment: CRITICAL RESULT CALLED TO, READ BACK BY AND VERIFIED WITH: SCHENOWITZ,[name] AT 5:30PM ON 10/02/14 BY FESTERMAN,C    Studies/Results: No results found.  Medications:  Prior to Admission:  Prescriptions prior to admission  Medication Sig Dispense Refill Last Dose  . benazepril (LOTENSIN) 20 MG tablet Take 20 mg by mouth daily.   09/30/2014 at Unknown time  . clopidogrel (PLAVIX) 75 MG tablet Take 75 mg by mouth daily.   09/30/2014 at Unknown time  . donepezil (ARICEPT) 10 MG tablet Take 20 mg by mouth at bedtime.   09/29/2014  . gabapentin (NEURONTIN) 400 MG capsule Take 400 mg by mouth 4 (four) times daily.   09/30/2014 at Unknown time  . HYDROcodone-acetaminophen (NORCO)  10-325 MG per tablet Take 1 tablet by mouth 4 (four) times daily.   09/30/2014 at Unknown time  . sertraline (ZOLOFT) 100 MG tablet Take 200 mg by mouth daily.    09/30/2014 at Unknown time  . simvastatin (ZOCOR) 40 MG tablet Take 40 mg by mouth at bedtime.    09/29/2014  . zolpidem (AMBIEN) 10 MG tablet Take 5 mg by mouth at bedtime.   5 09/30/2014 at Unknown time  . doxepin (SINEQUAN) 150 MG capsule Take 150 mg by mouth at bedtime.   11 09/29/2014   Scheduled: . digoxin  0.125 mg Oral Daily  . metoprolol tartrate  50 mg Oral 4 times per day  . metroNIDAZOLE  500 mg Oral Q8H  . potassium chloride  40 mEq Oral Once   Continuous: . heparin 1,400 Units/hr (10/07/14 0600)   OZD:[id number], LORazepam, methocarbamol **OR** methocarbamol (ROBAXIN)  IV, morphine injection  Assesment: She has a right hip fracture. When she came to the emergency department she was found to have atrial fibrillation with rapid ventricular response. She was treated initially with diltiazem but had hypotension. She has since been treated with metoprolol and is now on 50 mg every 6 hours with much better control of her blood pressure and her atrial fibrillation. At baseline she has severe dementia. She is positive for Clostridium difficile is not having a lot of diarrhea from that Principal Problem:   Hip fracture, right Active Problems:   Anxiety state   Essential hypertension   Atrial fibrillation with RVR   Fall at home   Atrial fibrillation with rapid ventricular response   Dementia    Plan: I think she can be cleared for surgery tomorrow.    LOS: 6 days   Tami [name] 10/07/2014, 8:37 AM

## 2014-10-07 NOTE — Progress Notes (Signed)
[facility] for Heparin Indication: atrial fibrillation  Allergies  Allergen Reactions  . Penicillins Other (See Comments)    Child hood allergy(unknown reaction)   Patient Measurements: Height: 5\' 9"  (175.3 cm) Weight: 203 lb 14.8 oz (92.5 kg) IBW/kg (Calculated) : 66.2 Heparin Dosing Weight: 85kg  Vital Signs: Temp: 97.8 F (36.6 C) (02/11 0757) Temp Source: Oral (02/11 0757) BP: 127/71 mmHg (02/11 0900) Pulse Rate: 91 (02/11 0900)  Labs:  Recent Labs  10/05/14 0504 10/06/14 0438 10/07/14 0418  HGB 12.9 12.7 12.4  HCT 40.1 40.9 39.8  PLT 334 293 280  HEPARINUNFRC 0.55 0.53 0.59  CREATININE 0.67  --  0.71   Estimated Creatinine Clearance: 73.6 mL/min (by C-G formula based on Cr of 0.71).  Medical History: Past Medical History  Diagnosis Date  . Arthritis   . Cancer Cervical  . Hypertension   . Stroke   . Anxiety    Medications:  Scheduled:  . digoxin  0.125 mg Oral Daily  . metoprolol tartrate  50 mg Oral 4 times per day  . metroNIDAZOLE  500 mg Oral Q8H   Assessment: 18 yoF admitted with hip fx found to be in Afib with RVR.  Asked to initiate heparin gtt for CHADS2Vasc score=6 while surgery plans pending.   CBC reviewed.   Heparin at goal.  No bleeding noted.   Goal of Therapy:  Heparin level 0.3-0.7 units/ml Monitor platelets by anticoagulation protocol: Yes   Plan:  Continue heparin infusion at 1400 units/hr Daily heparin level & CBC while on heparin F/U surgical plans & plans for long-term [facility], [place] A 10/07/2014,10:23 AM

## 2014-10-07 NOTE — Progress Notes (Signed)
    Consulting cardiologist: Rozann Lesches MD Primary Cardiologist: Kate Sable MD  Cardiology Specific Problem List: 1. New Onset Atrial Fib 2. Essential Hypertension   Subjective:    Resting. No specific complaints  Objective:   Temp:  [97.7 F (36.5 C)-98.7 F (37.1 C)] 97.8 F (36.6 C) (02/11 0757) Pulse Rate:  [65-134] 91 (02/11 0800) Resp:  [12-24] 18 (02/11 0800) BP: (110-161)/(50-108) 121/72 mmHg (02/11 0800) SpO2:  [90 %-99 %] 99 % (02/11 0800) Weight:  [203 lb 14.8 oz (92.5 kg)] 203 lb 14.8 oz (92.5 kg) (02/11 0500) Last BM Date: 10/05/14  Filed Weights   10/04/14 0500 10/06/14 0500 10/07/14 0500  Weight: 199 lb 4.7 oz (90.4 kg) 197 lb 8.5 oz (89.6 kg) 203 lb 14.8 oz (92.5 kg)    Intake/Output Summary (Last 24 hours) at 10/07/14 0854 Last data filed at 10/07/14 0800  Gross per 24 hour  Intake    336 ml  Output   1625 ml  Net  -1289 ml    Telemetry: Atrial fib, rates 80 to 90.  Exam:  General: No acute distress.  Lungs: Diminished in the bases. No wheezes.  Cardiac: No elevated JVP or bruits. IRRR, no gallop or rub.   Abdomen: Normoactive bowel sounds, nontender, nondistended.  Extremities: No pitting edema, distal pulses full. Left leg outward.   Lab Results:  Basic Metabolic Panel:  Recent Labs Lab 09/30/14 2332  10/03/14 0458 10/04/14 0454 10/05/14 0504  NA 139  < > 139 139 137  K 4.3  < > 4.3 3.9 3.5  CL 105  < > 104 103 104  CO2 30  < > 29 29 29   GLUCOSE 109*  < > 91 109* 110*  BUN 15  < > 16 12 15   CREATININE 1.01  < > 0.73 0.71 0.67  CALCIUM 8.5  < > 8.4 8.7 8.4  MG 1.9  --   --   --   --   < > = values in this interval not displayed.   CBC:  Recent Labs Lab 10/05/14 0504 10/06/14 0438 10/07/14 0418  WBC 11.1* 8.4 9.3  HGB 12.9 12.7 12.4  HCT 40.1 40.9 39.8  MCV 86.6 88.0 87.5  PLT 334 293 280     Medications:   Scheduled Medications: . digoxin  0.125 mg Oral Daily  . metoprolol tartrate  50 mg  Oral 4 times per day  . metroNIDAZOLE  500 mg Oral Q8H  . potassium chloride  40 mEq Oral Once    Infusions: . heparin 1,400 Units/hr (10/07/14 0600)    PRN Medications: HYDROcodone-acetaminophen, LORazepam, methocarbamol **OR** methocarbamol (ROBAXIN)  IV, morphine injection   Assessment and Plan:   1. Atrial fib: Rate better controlled on increased dose of lopressor 50 mg Q 6 hours. Heparin continues. Potassium low normal. Will provide replacement this am to keep at 4.0.  2. Hypertension: BP low normal. Continue current regimen.   3. Fx Left Hip: Awaiting surgery.  Phill Myron. Lawrence NP [place]  10/07/2014, 8:54 AM    Attending note:  Patient seen and examined. Agree with above assessment by Ms. Lawrence NP. Ms. Sprankle appears comfortable this morning, lungs are clear without labored breathing, cardiac exam with irregularly irregular rhythm and no gallop. Heart rate is better controlled in atrial fibrillation on Lopressor 50 mg every 6 hours. She continues on heparin. Patient awaits hip surgery. Will help transition and simplify medications postoperatively.  Satira Sark, M.D., F.A.C.C.

## 2014-10-08 ENCOUNTER — Inpatient Hospital Stay (HOSPITAL_COMMUNITY): Payer: Commercial Managed Care - HMO | Admitting: Anesthesiology

## 2014-10-08 ENCOUNTER — Encounter (HOSPITAL_COMMUNITY)
Admission: EM | Disposition: A | Discharge status: Skilled Nursing Facility | Payer: Self-pay | Source: Home / Self Care | Attending: Pulmonary Disease

## 2014-10-08 ENCOUNTER — Encounter (HOSPITAL_COMMUNITY): Payer: Self-pay | Admitting: *Deleted

## 2014-10-08 ENCOUNTER — Inpatient Hospital Stay (HOSPITAL_COMMUNITY): Payer: Commercial Managed Care - HMO

## 2014-10-08 DIAGNOSIS — A047 Enterocolitis due to Clostridium difficile: Secondary | ICD-10-CM

## 2014-10-08 HISTORY — PX: ORIF HIP FRACTURE: SHX2125

## 2014-10-08 LAB — BASIC METABOLIC PANEL
ANION GAP: 6 (ref 5–15)
BUN: 13 mg/dL (ref 6–23)
CHLORIDE: 101 mmol/L (ref 96–112)
CO2: 34 mmol/L — AB (ref 19–32)
Calcium: 8.7 mg/dL (ref 8.4–10.5)
Creatinine, Ser: 0.59 mg/dL (ref 0.50–1.10)
GFR calc Af Amer: >90 mL/min (ref >90)
GFR, EST NON AFRICAN AMERICAN: 87 mL/min — AB (ref >90)
GLUCOSE: 107 mg/dL — AB (ref 70–99)
Potassium: 3.8 mmol/L (ref 3.5–5.1)
SODIUM: 141 mmol/L (ref 135–145)

## 2014-10-08 LAB — CBC
HEMATOCRIT: 41.2 % (ref 36.0–46.0)
Hemoglobin: 12.7 g/dL (ref 12.0–15.0)
MCH: 27 pg (ref 26.0–34.0)
MCHC: 30.8 g/dL (ref 30.0–36.0)
MCV: 87.7 fL (ref 78.0–100.0)
Platelets: 290 10*3/uL (ref 150–400)
RBC: 4.7 MIL/uL (ref 3.87–5.11)
RDW: 14.5 % (ref 11.5–15.5)
WBC: 8.9 10*3/uL (ref 4.0–10.5)

## 2014-10-08 LAB — APTT: APTT: 26 s (ref 24–37)

## 2014-10-08 LAB — PREPARE RBC (CROSSMATCH)

## 2014-10-08 LAB — ABO/RH: ABO/RH(D): A POS

## 2014-10-08 LAB — HEPARIN LEVEL (UNFRACTIONATED): HEPARIN UNFRACTIONATED: 0.49 [IU]/mL (ref 0.30–0.70)

## 2014-10-08 SURGERY — OPEN REDUCTION INTERNAL FIXATION HIP
Anesthesia: Spinal | Site: Hip | Laterality: Right

## 2014-10-08 MED ORDER — BUPIVACAINE IN DEXTROSE 0.75-8.25 % IT SOLN
INTRATHECAL | Status: DC | PRN
  Administered 2014-10-08: 15 mg via INTRATHECAL

## 2014-10-08 MED ORDER — PROPOFOL INFUSION 10 MG/ML OPTIME
INTRAVENOUS | Status: DC | PRN
  Administered 2014-10-08: 17:00:00 via INTRAVENOUS
  Administered 2014-10-08: 25 ug/kg/min via INTRAVENOUS

## 2014-10-08 MED ORDER — SODIUM CHLORIDE 0.9 % IR SOLN
Status: DC | PRN
  Administered 2014-10-08: 1000 mL

## 2014-10-08 MED ORDER — PROPOFOL 10 MG/ML IV BOLUS
INTRAVENOUS | Status: AC
  Filled 2014-10-08: qty 20

## 2014-10-08 MED ORDER — ALBUTEROL SULFATE (2.5 MG/3ML) 0.083% IN NEBU
2.5000 mg | INHALATION_SOLUTION | Freq: Four times a day (QID) | RESPIRATORY_TRACT | Status: DC
  Administered 2014-10-08 – 2014-10-11 (×8): 2.5 mg via RESPIRATORY_TRACT
  Filled 2014-10-08 (×8): qty 3

## 2014-10-08 MED ORDER — EPHEDRINE SULFATE 50 MG/ML IJ SOLN
INTRAMUSCULAR | Status: AC
  Filled 2014-10-08: qty 1

## 2014-10-08 MED ORDER — BUPIVACAINE IN DEXTROSE 0.75-8.25 % IT SOLN
INTRATHECAL | Status: AC
  Filled 2014-10-08: qty 2

## 2014-10-08 MED ORDER — POVIDONE-IODINE 10 % EX SOLN: Freq: Once | CUTANEOUS | Status: DC

## 2014-10-08 MED ORDER — FENTANYL CITRATE 0.05 MG/ML IJ SOLN
INTRAMUSCULAR | Status: AC
  Filled 2014-10-08: qty 2

## 2014-10-08 MED ORDER — SODIUM CHLORIDE 0.9 % IV SOLN
1000.0000 mg | INTRAVENOUS | Status: DC | PRN
  Administered 2014-10-08: 1000 mg via INTRAVENOUS

## 2014-10-08 MED ORDER — SODIUM CHLORIDE 0.9 % IV SOLN: Freq: Once | INTRAVENOUS | Status: DC

## 2014-10-08 MED ORDER — HYDROGEN PEROXIDE 3 % EX SOLN
CUTANEOUS | Status: DC | PRN
  Administered 2014-10-08: 1 via TOPICAL

## 2014-10-08 MED ORDER — ENOXAPARIN SODIUM 40 MG/0.4ML ~~LOC~~ SOLN
40.0000 mg | SUBCUTANEOUS | Status: DC
  Administered 2014-10-09 – 2014-10-11 (×3): 40 mg via SUBCUTANEOUS
  Filled 2014-10-08 (×3): qty 0.4

## 2014-10-08 MED ORDER — FENTANYL CITRATE 0.05 MG/ML IJ SOLN
INTRAMUSCULAR | Status: DC | PRN
Start: 2014-10-08 — End: 2014-10-08
  Administered 2014-10-08: 25 ug via INTRATHECAL
  Administered 2014-10-08 (×3): 25 ug via INTRAVENOUS

## 2014-10-08 MED ORDER — MAGNESIUM HYDROXIDE 400 MG/5ML PO SUSP: 30.0000 mL | Freq: Every day | ORAL | Status: DC | PRN

## 2014-10-08 MED ORDER — MIDAZOLAM HCL 5 MG/5ML IJ SOLN
INTRAMUSCULAR | Status: DC | PRN
  Administered 2014-10-08: 0.5 mg via INTRAVENOUS
  Administered 2014-10-08: 1 mg via INTRAVENOUS
  Administered 2014-10-08: 0.5 mg via INTRAVENOUS

## 2014-10-08 MED ORDER — VANCOMYCIN HCL IN DEXTROSE 1-5 GM/200ML-% IV SOLN: 1000.0000 mg | Freq: Once | INTRAVENOUS | Status: DC

## 2014-10-08 MED ORDER — METOPROLOL TARTRATE 1 MG/ML IV SOLN
5.0000 mg | Freq: Once | INTRAVENOUS | Status: AC
  Administered 2014-10-08: 5 mg via INTRAVENOUS
  Filled 2014-10-08: qty 5

## 2014-10-08 MED ORDER — LACTATED RINGERS IV SOLN
INTRAVENOUS | Status: DC | PRN
  Administered 2014-10-08: 15:00:00 via INTRAVENOUS

## 2014-10-08 MED ORDER — SODIUM CHLORIDE 0.9 % IJ SOLN
INTRAMUSCULAR | Status: AC
  Filled 2014-10-08: qty 10

## 2014-10-08 MED ORDER — MIDAZOLAM HCL 2 MG/2ML IJ SOLN
INTRAMUSCULAR | Status: AC
  Filled 2014-10-08: qty 2

## 2014-10-08 SURGICAL SUPPLY — 49 items
BAG HAMPER (MISCELLANEOUS) ×3 IMPLANT
BIT DRILL TWIST 3.5MM (BIT) ×1 IMPLANT
BLADE 10 SAFETY STRL DISP (BLADE) ×6 IMPLANT
BLADE SURG SZ20 CARB STEEL (BLADE) ×3 IMPLANT
CLOTH BEACON ORANGE TIMEOUT ST (SAFETY) ×3 IMPLANT
COVER LIGHT HANDLE STERIS (MISCELLANEOUS) ×6 IMPLANT
COVER MAYO STAND XLG (DRAPE) ×3 IMPLANT
DRAPE STERI IOBAN 125X83 (DRAPES) ×3 IMPLANT
DRILL TWIST 3.5MM (BIT) ×3
ELECT REM PT RETURN 9FT ADLT (ELECTROSURGICAL) ×3
ELECTRODE REM PT RTRN 9FT ADLT (ELECTROSURGICAL) ×1 IMPLANT
EVACUATOR 3/16  PVC DRAIN (DRAIN) ×2
EVACUATOR 3/16 PVC DRAIN (DRAIN) ×1 IMPLANT
GAUZE SPONGE 4X4 12PLY STRL (GAUZE/BANDAGES/DRESSINGS) ×2 IMPLANT
GAUZE XEROFORM 5X9 LF (GAUZE/BANDAGES/DRESSINGS) ×3 IMPLANT
GLOVE BIO SURGEON STRL SZ8 (GLOVE) ×3 IMPLANT
GLOVE BIO SURGEON STRL SZ8.5 (GLOVE) ×3 IMPLANT
GOWN STRL REUS W/TWL LRG LVL3 (GOWN DISPOSABLE) ×9 IMPLANT
GOWN STRL REUS W/TWL XL LVL3 (GOWN DISPOSABLE) ×3 IMPLANT
GUIDE PIN CALIBRATED (PIN) ×6 IMPLANT
INST SET MAJOR BONE (KITS) ×3 IMPLANT
KIT BLADEGUARD II DBL (SET/KITS/TRAYS/PACK) ×3 IMPLANT
KIT ROOM TURNOVER AP CYSTO (KITS) ×3 IMPLANT
MANIFOLD NEPTUNE II (INSTRUMENTS) ×3 IMPLANT
MARKER SKIN DUAL TIP RULER LAB (MISCELLANEOUS) ×3 IMPLANT
NS IRRIG 1000ML POUR BTL (IV SOLUTION) ×3 IMPLANT
PACK BASIC III (CUSTOM PROCEDURE TRAY) ×2
PACK SRG BSC III STRL LF ECLPS (CUSTOM PROCEDURE TRAY) ×1 IMPLANT
PAD ABD 5X9 TENDERSORB (GAUZE/BANDAGES/DRESSINGS) ×6 IMPLANT
PAD ARMBOARD 7.5X6 YLW CONV (MISCELLANEOUS) ×3 IMPLANT
PENCIL HANDSWITCHING (ELECTRODE) ×3 IMPLANT
PLATE SHORT BARREL 135X4 (Plate) ×3 IMPLANT
SCREW CORTICAL SFTP 4.5X44MM (Screw) ×6 IMPLANT
SCREW CORTICAL SFTP 4.5X46MM (Screw) ×3 IMPLANT
SCREW CORTICAL SFTP 4.5X50MM (Screw) ×3 IMPLANT
SCREW LAG 95MM (Screw) ×2 IMPLANT
SCREW LAGSTD 95X21X12.7X9 (Screw) ×1 IMPLANT
SET BASIN LINEN APH (SET/KITS/TRAYS/PACK) ×3 IMPLANT
SPONGE DRAIN TRACH 4X4 STRL 2S (GAUZE/BANDAGES/DRESSINGS) ×3 IMPLANT
SPONGE GAUZE 4X4 12PLY (GAUZE/BANDAGES/DRESSINGS) ×3 IMPLANT
SPONGE LAP 18X18 X RAY DECT (DISPOSABLE) ×6 IMPLANT
STAPLER VISISTAT 35W (STAPLE) ×3 IMPLANT
SUT BRALON NAB BRD #1 30IN (SUTURE) ×9 IMPLANT
SUT PLAIN 2 0 XLH (SUTURE) ×3 IMPLANT
SUT SILK 0 FSL (SUTURE) ×3 IMPLANT
SYR BULB IRRIGATION 50ML (SYRINGE) ×3 IMPLANT
TAPE HYPAFIX 4 X10 (GAUZE/BANDAGES/DRESSINGS) ×3 IMPLANT
TAPE MEDIFIX FOAM 3 (GAUZE/BANDAGES/DRESSINGS) ×3 IMPLANT
YANKAUER SUCT 12FT TUBE ARGYLE (SUCTIONS) ×3 IMPLANT

## 2014-10-08 NOTE — Progress Notes (Signed)
Subjective: She is overall about the same. She continues to have atrial fibrillation and heart rate 70-100. She is set for surgery later today.  Objective: Vital signs in last 24 hours: Temp:  [97.1 F (36.2 C)-98.2 F (36.8 C)] 97.4 F (36.3 C) (02/12 0400) Pulse Rate:  [32-102] 95 (02/12 0700) Resp:  [13-26] 13 (02/12 0700) BP: (113-155)/(57-104) 129/89 mmHg (02/12 0700) SpO2:  [89 %-100 %] 96 % (02/12 0700) Weight:  [91.5 kg (201 lb 11.5 oz)] 91.5 kg (201 lb 11.5 oz) (02/12 0500) Weight change: -1 kg (-2 lb 3.3 oz) Last BM Date: 10/07/14  Intake/Output from previous day: 02/11 0701 - 02/12 0700 In: 322 [I.V.:322] Out: 1200 [Urine:1200]  PHYSICAL EXAM General appearance: alert and confused Resp: clear to auscultation bilaterally Cardio: irregularly irregular rhythm GI: soft, non-tender; bowel sounds normal; no masses,  no organomegaly Extremities: right hip fracture  Lab Results:  Results for orders placed or performed during the hospital encounter of 09/30/14 (from the past 48 hour(s))  Heparin level (unfractionated)     Status: None   Collection Time: 10/07/14  4:18 AM  Result Value Ref Range   Heparin Unfractionated 0.59 0.30 - 0.70 IU/mL    Comment:        IF HEPARIN RESULTS ARE BELOW EXPECTED VALUES, AND PATIENT DOSAGE HAS BEEN CONFIRMED, SUGGEST FOLLOW UP TESTING OF ANTITHROMBIN III LEVELS.   CBC     Status: None   Collection Time: 10/07/14  4:18 AM  Result Value Ref Range   WBC 9.3 4.0 - 10.5 K/uL   RBC 4.55 3.87 - 5.11 MIL/uL   Hemoglobin 12.4 12.0 - 15.0 g/dL   HCT 39.8 36.0 - 46.0 %   MCV 87.5 78.0 - 100.0 fL   MCH 27.3 26.0 - 34.0 pg   MCHC 31.2 30.0 - 36.0 g/dL   RDW 14.5 11.5 - 15.5 %   Platelets 280 150 - 400 K/uL  Basic metabolic panel     Status: Abnormal   Collection Time: 10/07/14  4:18 AM  Result Value Ref Range   Sodium 138 135 - 145 mmol/L   Potassium 3.4 (L) 3.5 - 5.1 mmol/L   Chloride 99 96 - 112 mmol/L   CO2 33 (H) 19 - 32 mmol/L    Glucose, Bld 87 70 - 99 mg/dL   BUN 13 6 - 23 mg/dL   Creatinine, Ser 0.71 0.50 - 1.10 mg/dL   Calcium 8.5 8.4 - 10.5 mg/dL   GFR calc non Af Amer 82 (L) >90 mL/min   GFR calc Af Amer >90 >90 mL/min    Comment: (NOTE) The eGFR has been calculated using the CKD EPI equation. This calculation has not been validated in all clinical situations. eGFR's persistently <90 mL/min signify possible Chronic Kidney Disease.    Anion gap 6 5 - 15  Heparin level (unfractionated)     Status: None   Collection Time: 10/08/14  4:19 AM  Result Value Ref Range   Heparin Unfractionated 0.49 0.30 - 0.70 IU/mL    Comment:        IF HEPARIN RESULTS ARE BELOW EXPECTED VALUES, AND PATIENT DOSAGE HAS BEEN CONFIRMED, SUGGEST FOLLOW UP TESTING OF ANTITHROMBIN III LEVELS.   CBC     Status: None   Collection Time: 10/08/14  4:19 AM  Result Value Ref Range   WBC 8.9 4.0 - 10.5 K/uL   RBC 4.70 3.87 - 5.11 MIL/uL   Hemoglobin 12.7 12.0 - 15.0 g/dL   HCT 41.2 36.0 -  46.0 %   MCV 87.7 78.0 - 100.0 fL   MCH 27.0 26.0 - 34.0 pg   MCHC 30.8 30.0 - 36.0 g/dL   RDW 14.5 11.5 - 15.5 %   Platelets 290 150 - 400 K/uL  Basic metabolic panel     Status: Abnormal   Collection Time: 10/08/14  4:19 AM  Result Value Ref Range   Sodium 141 135 - 145 mmol/L   Potassium 3.8 3.5 - 5.1 mmol/L   Chloride 101 96 - 112 mmol/L   CO2 34 (H) 19 - 32 mmol/L   Glucose, Bld 107 (H) 70 - 99 mg/dL   BUN 13 6 - 23 mg/dL   Creatinine, Ser 0.59 0.50 - 1.10 mg/dL   Calcium 8.7 8.4 - 10.5 mg/dL   GFR calc non Af Amer 87 (L) >90 mL/min   GFR calc Af Amer >90 >90 mL/min    Comment: (NOTE) The eGFR has been calculated using the CKD EPI equation. This calculation has not been validated in all clinical situations. eGFR's persistently <90 mL/min signify possible Chronic Kidney Disease.    Anion gap 6 5 - 15    ABGS No results for input(s): PHART, PO2ART, TCO2, HCO3 in the last 72 hours.  Invalid input(s): PCO2 CULTURES Recent  Results (from the past 240 hour(s))  MRSA PCR Screening     Status: None   Collection Time: 10/01/14  3:00 AM  Result Value Ref Range Status   MRSA by PCR NEGATIVE NEGATIVE Final    Comment:        The GeneXpert MRSA Assay (FDA approved for NASAL specimens only), is one component of a comprehensive MRSA colonization surveillance program. It is not intended to diagnose MRSA infection nor to guide or monitor treatment for MRSA infections.   Clostridium Difficile by PCR     Status: Abnormal   Collection Time: 10/02/14  2:43 PM  Result Value Ref Range Status   C difficile by pcr POSITIVE (A) NEGATIVE Final    Comment: CRITICAL RESULT CALLED TO, READ BACK BY AND VERIFIED WITH: SCHENOWITZ,L AT 5:30PM ON 10/02/14 BY FESTERMAN,C    Studies/Results: No results found.  Medications:  Prior to Admission:  Prescriptions prior to admission  Medication Sig Dispense Refill Last Dose  . benazepril (LOTENSIN) 20 MG tablet Take 20 mg by mouth daily.   09/30/2014 at Unknown time  . clopidogrel (PLAVIX) 75 MG tablet Take 75 mg by mouth daily.   09/30/2014 at Unknown time  . donepezil (ARICEPT) 10 MG tablet Take 20 mg by mouth at bedtime.   09/29/2014  . gabapentin (NEURONTIN) 400 MG capsule Take 400 mg by mouth 4 (four) times daily.   09/30/2014 at Unknown time  . HYDROcodone-acetaminophen (NORCO) 10-325 MG per tablet Take 1 tablet by mouth 4 (four) times daily.   09/30/2014 at Unknown time  . sertraline (ZOLOFT) 100 MG tablet Take 200 mg by mouth daily.    09/30/2014 at Unknown time  . simvastatin (ZOCOR) 40 MG tablet Take 40 mg by mouth at bedtime.    09/29/2014  . zolpidem (AMBIEN) 10 MG tablet Take 5 mg by mouth at bedtime.   5 09/30/2014 at Unknown time  . doxepin (SINEQUAN) 150 MG capsule Take 150 mg by mouth at bedtime.   11 09/29/2014   Scheduled: . sodium chloride   Intravenous Once  . digoxin  0.125 mg Oral Daily  . metoprolol tartrate  50 mg Oral 4 times per day  . metroNIDAZOLE  500  mg Oral Q8H  .  povidone-iodine   Topical Once  . vancomycin  1,000 mg Intravenous Once   Continuous: . heparin Stopped (10/08/14 0720)   PRN:HYDROcodone-acetaminophen, LORazepam, methocarbamol **OR** methocarbamol (ROBAXIN)  IV, morphine injection  Assesment:she was admitted with a right hip fracture. She was found to have atrial fibrillation with rapid ventricular response. She was started initially on diltiazem but had trouble with hypotension while she was on the diltiazem. She is now on Lopressor and her heart rate is controlled and her blood pressure is okay. She was also found to have Clostridium difficile colitis and she is on metronidazole for that. She has dementia at baseline. She remains pleasantly confused  Principal Problem:   Hip fracture, right Active Problems:   Anxiety state   Essential hypertension   Atrial fibrillation with RVR   Fall at home   Atrial fibrillation with rapid ventricular response   Dementia   Clostridium difficile colitis    Plan:continue treatments. For surgery later today    LOS: 7 days   , L 10/08/2014, 7:41 AM   

## 2014-10-08 NOTE — Brief Op Note (Signed)
09/30/2014 - 10/08/2014  5:08 PM  PATIENT:  Tami [name]  74 y.o. female  PRE-OPERATIVE DIAGNOSIS:  Fracture Right Hip, interterochanteric  POST-OPERATIVE DIAGNOSIS:  Fracture Right Hip  PROCEDURE:  Procedure(s): OPEN REDUCTION INTERNAL FIXATION RIGHT HIP (Right) with Smith-Nephew Hip Compression Screw system  SURGEON:  Surgeon(s) and Role:    * Sanjuana Kava, MD - Primary  PHYSICIAN ASSISTANT:   ASSISTANTS: C. Jeffie Pollock, RN   ANESTHESIA:   spinal  EBL:  Total I/O In: 532.7 [I.V.:532.7] Out: 400 [Urine:400]  BLOOD ADMINISTERED:none  DRAINS: (Large) Hemovact drain(s) in the Right hip with  Suction Open   LOCAL MEDICATIONS USED:  NONE  SPECIMEN:  No Specimen  DISPOSITION OF SPECIMEN:  PATHOLOGY  COUNTS:  YES  TOURNIQUET:  * No tourniquets in log *  DICTATION: .Other Dictation: Dictation Number [id number]  PLAN OF CARE: Admit to inpatient   PATIENT DISPOSITION:  PACU - hemodynamically stable.   Delay start of Pharmacological VTE agent (>24hrs) due to surgical blood loss or risk of bleeding: no

## 2014-10-08 NOTE — Progress Notes (Signed)
Primary cardiologist: Dr. Kate Sable  Seen for followup: Atrial fibrillation  Subjective:    No palpitations or chest pain. No active hip pain.  Objective:   Temp:  [97.1 F (36.2 C)-98.2 F (36.8 C)] 97.4 F (36.3 C) (02/12 0400) Pulse Rate:  [32-102] 95 (02/12 0700) Resp:  [13-26] 13 (02/12 0700) BP: (113-155)/(57-104) 129/89 mmHg (02/12 0700) SpO2:  [89 %-100 %] 96 % (02/12 0700) Weight:  [201 lb 11.5 oz (91.5 kg)] 201 lb 11.5 oz (91.5 kg) (02/12 0500) Last BM Date: 10/07/14  Filed Weights   10/06/14 0500 10/07/14 0500 10/08/14 0500  Weight: 197 lb 8.5 oz (89.6 kg) 203 lb 14.8 oz (92.5 kg) 201 lb 11.5 oz (91.5 kg)    Intake/Output Summary (Last 24 hours) at 10/08/14 0807 Last data filed at 10/08/14 0720  Gross per 24 hour  Intake 354.67 ml  Output   1000 ml  Net -645.33 ml    Telemetry: Persistent atrial fibrillation, heart rate 80-100 overall.  Exam:  General: No distress.  Lungs: Decreased breath sounds, nonlabored.  Cardiac: Irregularly irregular.  Extremities: No pitting edema.  Lab Results:  Basic Metabolic Panel:  Recent Labs Lab 10/05/14 0504 10/07/14 0418 10/08/14 0419  NA 137 138 141  K 3.5 3.4* 3.8  CL 104 99 101  CO2 29 33* 34*  GLUCOSE 110* 87 107*  BUN 15 13 13   CREATININE 0.67 0.71 0.59  CALCIUM 8.4 8.5 8.7     CBC:  Recent Labs Lab 10/06/14 0438 10/07/14 0418 10/08/14 0419  WBC 8.4 9.3 8.9  HGB 12.7 12.4 12.7  HCT 40.9 39.8 41.2  MCV 88.0 87.5 87.7  PLT 293 280 290    Echocardiogram 10/01/14: Study Conclusions  - Procedure narrative: Transthoracic echocardiography. Image quality was suboptimal. The study was technically difficult, as a result of poor sound wave transmission, restricted patient mobility, and body habitus. - Left ventricle: The cavity size was normal. Systolic function was normal. The estimated ejection fraction was in the range of 60% to 65%. Images were inadequate for LV  wall motion assessment. The study was not technically sufficient to allow evaluation of LV diastolic dysfunction due to atrial fibrillation. There was no evidence of elevated ventricular filling pressure by Doppler parameters. Moderate to severe concentric left ventricular hypertrophy. - Aortic valve: Mildly calcified annulus. Moderately thickened, mildly calcified leaflets. There was no stenosis. There was trivial regurgitation. Mean gradient (S): 3 mm Hg. - Aorta: Mild ascending aortic dilatation, maximum diameter 3.74 cm. Mild aortic root dilatation. Aortic root dimension: 42 mm (ED). - Mitral valve: Mildly calcified annulus. Normal thickness leaflets . There was mild to moderate regurgitation. - Left atrium: The atrium was moderately to severely dilated. - Right atrium: The atrium was mildly dilated. - Tricuspid valve: There was mild-moderate regurgitation. - Pulmonary arteries: PA peak pressure: 40 mm Hg (S). Mildly elevated pulmonary pressures. - Inferior vena cava: The vessel was dilated. The respirophasic diameter changes were blunted (< 50%), consistent with elevated central venous pressure.   Medications:   Scheduled Medications: . sodium chloride   Intravenous Once  . digoxin  0.125 mg Oral Daily  . metoprolol tartrate  50 mg Oral 4 times per day  . metroNIDAZOLE  500 mg Oral Q8H  . povidone-iodine   Topical Once  . vancomycin  1,000 mg Intravenous Once    Infusions: . heparin Stopped (10/08/14 0720)    PRN Medications: HYDROcodone-acetaminophen, LORazepam, methocarbamol **OR** methocarbamol (ROBAXIN)  IV, morphine injection  Assessment:   1. Newly diagnosed persistent atrial fibrillation, CHADSVASC score 6. At this point patient on IV heparin (in anticipation of hip surgery), and oral metoprolol. Can consider transition to oral anticoagulant later.  2. Essential hypertension.  3. Right hip fracture following mechanical fall.  Surgery anticipated later today.  4. Clostridium difficile colitis.   Plan/Discussion:    Continue Lopressor 50 mg every 6 hours, heart rate control in atrial fibrillation has been better. Hip surgery anticipated later today. Once more stable postoperatively, she will need to transition to an oral anticoagulant.  Satira Sark, M.D., F.A.C.C.

## 2014-10-08 NOTE — Transfer of Care (Signed)
Immediate Anesthesia Transfer of Care Note  Patient: Tami [name]  Procedure(s) Performed: Procedure(s): OPEN REDUCTION INTERNAL FIXATION RIGHT HIP (Right)  Patient Location: ICU  Anesthesia Type:Spinal  Level of Consciousness: awake and patient cooperative  Airway & Oxygen Therapy: Patient Spontanous Breathing and Patient connected to face mask oxygen  Post-op Assessment: Report given to RN and Post -op Vital signs reviewed and stable  Post vital signs: Reviewed and stable  Last Vitals:  Filed Vitals:   10/08/14 1505  BP: 152/65  Pulse:   Temp:   Resp: 20    Complications: No apparent anesthesia complications

## 2014-10-08 NOTE — Anesthesia Postprocedure Evaluation (Signed)
  Anesthesia Post-op Note  Patient: Tami [name]  Procedure(s) Performed: Procedure(s): OPEN REDUCTION INTERNAL FIXATION RIGHT HIP (Right)  Patient Location: ICU  Anesthesia Type:Spinal  Level of Consciousness: awake, alert  and patient cooperative  Airway and Oxygen Therapy: Patient Spontanous Breathing  Post-op Pain: 3 /10, mild  Post-op Assessment: Post-op Vital signs reviewed, Patient's Cardiovascular Status Stable, Respiratory Function Stable, Patent Airway and No headache  Post-op Vital Signs: Reviewed and stable  Last Vitals:  Filed Vitals:   10/08/14 1505  BP: 152/65  Pulse:   Temp:   Resp: 20    Complications: No apparent anesthesia complications

## 2014-10-08 NOTE — Anesthesia Procedure Notes (Signed)
Spinal Patient location during procedure: OR Start time: 10/08/2014 3:49 PM Staffing Resident/CRNA: Bernette Redbird J Preanesthetic Checklist Completed: patient identified, site marked, surgical consent, pre-op evaluation, timeout performed, IV checked, risks and benefits discussed and monitors and equipment checked Spinal Block Patient position: right lateral decubitus Prep: Betadine Patient monitoring: heart rate, cardiac monitor, continuous pulse ox and blood pressure Approach: midline Location: L2-3 Injection technique: single-shot Needle Needle type: Spinocan  Needle gauge: 22 G Assessment Sensory level: T8 Additional Notes [id number]        04/2015     CSF slow and clear

## 2014-10-08 NOTE — Anesthesia Preprocedure Evaluation (Addendum)
Anesthesia Evaluation  Patient identified by MRN, date of birth, ID band Patient awake    Reviewed: Allergy & Precautions, NPO status , Patient's Chart, lab work & pertinent test results, reviewed documented beta blocker date and time   Airway   TM Distance: >3 FB Neck ROM: Full    Dental  (+) Edentulous Upper, Edentulous Lower   Pulmonary former smoker,  breath sounds clear to auscultation        Cardiovascular Exercise Tolerance: Poor hypertension, Pt. on medications + dysrhythmias Atrial Fibrillation Rhythm:Regular Rate:Bradycardia     Neuro/Psych Anxiety Depression CVA, Residual Symptoms    GI/Hepatic GERD-  Controlled,  Endo/Other  Morbid obesity  Renal/GU      Musculoskeletal  (+) Arthritis -, Osteoarthritis,    Abdominal (+) + obese,  Abdomen: soft.    Peds  Hematology Heparin stopped 12Feb 2016 @0720    Anesthesia Other Findings   Reproductive/Obstetrics                        Anesthesia Physical Anesthesia Plan  ASA: III  Anesthesia Plan: Spinal   Post-op Pain Management:    Induction:   Airway Management Planned: Simple Face Mask  Additional Equipment:   Intra-op Plan:   Post-operative Plan:   Informed Consent: I have reviewed the patients History and Physical, chart, labs and discussed the procedure including the risks, benefits and alternatives for the proposed anesthesia with the patient or authorized representative who has indicated his/her understanding and acceptance.     Plan Discussed with: CRNA, Anesthesiologist and Surgeon  Anesthesia Plan Comments:         Anesthesia Quick Evaluation

## 2014-10-09 LAB — CBC WITH DIFFERENTIAL/PLATELET
Basophils Absolute: 0 10*3/uL (ref 0.0–0.1)
Basophils Relative: 0 % (ref 0–1)
EOS PCT: 0 % (ref 0–5)
Eosinophils Absolute: 0 10*3/uL (ref 0.0–0.7)
HCT: 39.1 % (ref 36.0–46.0)
HEMOGLOBIN: 12.5 g/dL (ref 12.0–15.0)
Lymphocytes Relative: 12 % (ref 12–46)
Lymphs Abs: 1.3 10*3/uL (ref 0.7–4.0)
MCH: 28.3 pg (ref 26.0–34.0)
MCHC: 32 g/dL (ref 30.0–36.0)
MCV: 88.5 fL (ref 78.0–100.0)
Monocytes Absolute: 1 10*3/uL (ref 0.1–1.0)
Monocytes Relative: 9 % (ref 3–12)
Neutro Abs: 8.9 10*3/uL — ABNORMAL HIGH (ref 1.7–7.7)
Neutrophils Relative %: 79 % — ABNORMAL HIGH (ref 43–77)
Platelets: 302 10*3/uL (ref 150–400)
RBC: 4.42 MIL/uL (ref 3.87–5.11)
RDW: 14.2 % (ref 11.5–15.5)
WBC: 11.2 10*3/uL — AB (ref 4.0–10.5)

## 2014-10-09 LAB — BASIC METABOLIC PANEL
Anion gap: 9 (ref 5–15)
BUN: 17 mg/dL (ref 6–23)
CO2: 34 mmol/L — ABNORMAL HIGH (ref 19–32)
CREATININE: 0.59 mg/dL (ref 0.50–1.10)
Calcium: 8.8 mg/dL (ref 8.4–10.5)
Chloride: 99 mmol/L (ref 96–112)
GFR calc non Af Amer: 87 mL/min — ABNORMAL LOW (ref >90)
GLUCOSE: 106 mg/dL — AB (ref 70–99)
Potassium: 4.1 mmol/L (ref 3.5–5.1)
SODIUM: 142 mmol/L (ref 135–145)

## 2014-10-09 LAB — DIGOXIN LEVEL: Digoxin Level: 1.4 ng/mL (ref 0.8–2.0)

## 2014-10-09 MED ORDER — DILTIAZEM HCL 30 MG PO TABS
30.0000 mg | ORAL_TABLET | Freq: Four times a day (QID) | ORAL | Status: DC
  Administered 2014-10-09 – 2014-10-11 (×9): 30 mg via ORAL
  Filled 2014-10-09 (×9): qty 1

## 2014-10-09 NOTE — Evaluation (Signed)
Physical Therapy Evaluation Patient Details Name: HARTLEY WYKE MRN: [id number] DOB: 1941/07/04 Today's Date: 10/09/2014   History of Present Illness  Ms. Mumpower is a 74 yo female who fell at home and fx her Rt hip on 10/01/2014.  She also was noted to have a new A-fib which had to be resolved prior to her hip surgery.  An ORIF was completed on 10/08/2014 and she is now being referred to PT to maximize her functional potential .  The patient has early dementia.   Clinical Impression  Pt is very reluctant to move any extremity of her body but was able to convince pt to assist in moving her Lt LE and B UE with assistance.  Rt LE had PROM with pt crying out loud throughout the motions.   Attempted supine to sit but pt resisted therapist.  Pt will need at least 2 assist for this activity and will need equipment for bed to chair transfer.     Follow Up Recommendations SNF    Equipment Recommendations   none    Recommendations for Other Services OT consult     Precautions / Restrictions Precautions Precautions: Fall Restrictions Weight Bearing Restrictions: Yes RLE Weight Bearing: Touchdown weight bearing             Pertinent Vitals/Pain Pain Assessment: 0-10 Pain Score: 5  Pain Descriptors / Indicators: Throbbing Pain Intervention(s): Limited activity within patient's tolerance;Repositioned    Home Living Family/patient expects to be discharged to:: Skilled nursing facility Living Arrangements: Spouse/significant other                    Prior Function Level of Independence: Needs assistance   Gait / Transfers Assistance Needed: with quad cane  ADL's / Homemaking Assistance Needed: husband does           Extremity/Trunk Assessment   Upper Extremity Assessment: Generalized weakness           Lower Extremity Assessment: Generalized weakness         Communication   Communication: No difficulties  Cognition Arousal/Alertness: Awake/alert Behavior  During Therapy: WFL for tasks assessed/performed Overall Cognitive Status: Within Functional Limits for tasks assessed             General Comments  PT general UE strength 2/5 for shoulder 3/5 for elbow;  LE on Lt 2/5. Rt unable to assess    Exercises Total Joint Exercises Ankle Circles/Pumps: AAROM;Both;10 reps Heel Slides: AAROM;Both;10 reps Hip ABduction/ADduction: AAROM;Both;10 reps General Exercises - Upper Extremity Shoulder Flexion: AAROM;Both;10 reps Shoulder ABduction: AAROM;Both;10 reps Elbow Flexion: AAROM;Both;10 reps      Assessment/Plan    PT Assessment Patient needs continued PT services  PT Diagnosis Difficulty walking;Generalized weakness;Acute pain   PT Problem List Decreased strength;Decreased range of motion;Decreased activity tolerance;Decreased balance;Decreased mobility;Decreased knowledge of use of DME;Pain  PT Treatment Interventions Gait training;DME instruction;Functional mobility training;Therapeutic activities;Therapeutic exercise   PT Goals (Current goals can be found in the Care Plan section)      Frequency Min 5X/week           End of Session   Activity Tolerance: Patient limited by pain Patient left: in bed;with family/visitor present           Time: 1025-1107 PT Time Calculation (min) (ACUTE ONLY): 42 min   Charges:   PT Evaluation $Initial PT Evaluation Tier I: 1 Procedure PT Treatments $Therapeutic Exercise: 8-22 mins        Franco Duley,CINDY 10/09/2014, 11:07 AM

## 2014-10-09 NOTE — Anesthesia Postprocedure Evaluation (Signed)
  Anesthesia Post-op Note  Patient: Tami [name]  Procedure(s) Performed: Procedure(s): OPEN REDUCTION INTERNAL FIXATION RIGHT HIP (Right)  Patient Location: ICU  Anesthesia Type:Spinal  Level of Consciousness: awake, alert  and patient cooperative  Airway and Oxygen Therapy: Patient Spontanous Breathing and Patient connected to nasal cannula oxygen  Post-op Pain: 4 /10, moderate  Post-op Assessment: Post-op Vital signs reviewed, Patient's Cardiovascular Status Stable, Respiratory Function Stable, Patent Airway, No signs of Nausea or vomiting and No headache  Post-op Vital Signs: Reviewed and stable  Last Vitals:  Filed Vitals:   10/09/14 1543  BP:   Pulse:   Temp:   Resp: 20    Complications: No apparent anesthesia complications

## 2014-10-09 NOTE — Progress Notes (Signed)
Subjective: She says she is having pain. She had her surgery yesterday which went well. She is still having some trouble with rate control on her atrial fibrillation. She is not having any diarrhea. She will finish metronidazole today  Objective: Vital signs in last 24 hours: Temp:  [97.1 F (36.2 C)-98.2 F (36.8 C)] 97.1 F (36.2 C) (02/13 0730) Pulse Rate:  [50-115] 94 (02/13 0700) Resp:  [12-22] 22 (02/13 0726) BP: (123-156)/(57-127) 126/68 mmHg (02/13 0700) SpO2:  [88 %-100 %] 92 % (02/13 0743) Weight:  [90 kg (198 lb 6.6 oz)] 90 kg (198 lb 6.6 oz) (02/13 0500) Weight change: -1.5 kg (-3 lb 4.9 oz) Last BM Date: 10/07/14  Intake/Output from previous day: 02/12 0701 - 02/13 0700 In: 732.7 [I.V.:732.7] Out: 1320 [Urine:1050; Drains:120; Blood:150]  PHYSICAL EXAM General appearance: alert and moderate distress Resp: clear to auscultation bilaterally Cardio: irregularly irregular rhythm GI: soft, non-tender; bowel sounds normal; no masses,  no organomegaly Extremities: post hip surgery  Lab Results:  Results for orders placed or performed during the hospital encounter of 09/30/14 (from the past 48 hour(s))  Heparin level (unfractionated)     Status: None   Collection Time: 10/08/14  4:19 AM  Result Value Ref Range   Heparin Unfractionated 0.49 0.30 - 0.70 IU/mL    Comment:        IF HEPARIN RESULTS ARE BELOW EXPECTED VALUES, AND PATIENT DOSAGE HAS BEEN CONFIRMED, SUGGEST FOLLOW UP TESTING OF ANTITHROMBIN III LEVELS.   CBC     Status: None   Collection Time: 10/08/14  4:19 AM  Result Value Ref Range   WBC 8.9 4.0 - 10.5 K/uL   RBC 4.70 3.87 - 5.11 MIL/uL   Hemoglobin 12.7 12.0 - 15.0 g/dL   HCT 41.2 36.0 - 46.0 %   MCV 87.7 78.0 - 100.0 fL   MCH 27.0 26.0 - 34.0 pg   MCHC 30.8 30.0 - 36.0 g/dL   RDW 14.5 11.5 - 15.5 %   Platelets 290 150 - 400 K/uL  Basic metabolic panel     Status: Abnormal   Collection Time: 10/08/14  4:19 AM  Result Value Ref Range   Sodium 141 135 - 145 mmol/L   Potassium 3.8 3.5 - 5.1 mmol/L   Chloride 101 96 - 112 mmol/L   CO2 34 (H) 19 - 32 mmol/L   Glucose, Bld 107 (H) 70 - 99 mg/dL   BUN 13 6 - 23 mg/dL   Creatinine, Ser 0.59 0.50 - 1.10 mg/dL   Calcium 8.7 8.4 - 10.5 mg/dL   GFR calc non Af Amer 87 (L) >90 mL/min   GFR calc Af Amer >90 >90 mL/min    Comment: (NOTE) The eGFR has been calculated using the CKD EPI equation. This calculation has not been validated in all clinical situations. eGFR's persistently <90 mL/min signify possible Chronic Kidney Disease.    Anion gap 6 5 - 15  Prepare RBC     Status: None   Collection Time: 10/08/14  9:10 AM  Result Value Ref Range   Order Confirmation ORDER PROCESSED BY BLOOD BANK   Type and screen     Status: None (Preliminary result)   Collection Time: 10/08/14  9:10 AM  Result Value Ref Range   ABO/RH(D) A POS    Antibody Screen NEG    Sample Expiration 10/11/2014    Unit Number [id number]    Blood Component Type RED CELLS,LR    Unit division 00    Status  of Unit ALLOCATED    Transfusion Status OK TO TRANSFUSE    Crossmatch Result Compatible    Unit Number [id number]    Blood Component Type RED CELLS,LR    Unit division 00    Status of Unit ALLOCATED    Transfusion Status OK TO TRANSFUSE    Crossmatch Result Compatible   ABO/Rh     Status: None   Collection Time: 10/08/14  9:15 AM  Result Value Ref Range   ABO/RH(D) A POS   APTT     Status: None   Collection Time: 10/08/14  2:53 PM  Result Value Ref Range   aPTT 26 24 - 37 seconds  Basic metabolic panel     Status: Abnormal   Collection Time: 10/09/14  4:59 AM  Result Value Ref Range   Sodium 142 135 - 145 mmol/L   Potassium 4.1 3.5 - 5.1 mmol/L   Chloride 99 96 - 112 mmol/L   CO2 34 (H) 19 - 32 mmol/L   Glucose, Bld 106 (H) 70 - 99 mg/dL   BUN 17 6 - 23 mg/dL   Creatinine, Ser 0.59 0.50 - 1.10 mg/dL   Calcium 8.8 8.4 - 10.5 mg/dL   GFR calc non Af Amer 87 (L) >90 mL/min   GFR calc  Af Amer >90 >90 mL/min    Comment: (NOTE) The eGFR has been calculated using the CKD EPI equation. This calculation has not been validated in all clinical situations. eGFR's persistently <90 mL/min signify possible Chronic Kidney Disease.    Anion gap 9 5 - 15  CBC with Differential/Platelet     Status: Abnormal   Collection Time: 10/09/14  4:59 AM  Result Value Ref Range   WBC 11.2 (H) 4.0 - 10.5 K/uL   RBC 4.42 3.87 - 5.11 MIL/uL   Hemoglobin 12.5 12.0 - 15.0 g/dL   HCT 39.1 36.0 - 46.0 %   MCV 88.5 78.0 - 100.0 fL   MCH 28.3 26.0 - 34.0 pg   MCHC 32.0 30.0 - 36.0 g/dL   RDW 14.2 11.5 - 15.5 %   Platelets 302 150 - 400 K/uL   Neutrophils Relative % 79 (H) 43 - 77 %   Neutro Abs 8.9 (H) 1.7 - 7.7 K/uL   Lymphocytes Relative 12 12 - 46 %   Lymphs Abs 1.3 0.7 - 4.0 K/uL   Monocytes Relative 9 3 - 12 %   Monocytes Absolute 1.0 0.1 - 1.0 K/uL   Eosinophils Relative 0 0 - 5 %   Eosinophils Absolute 0.0 0.0 - 0.7 K/uL   Basophils Relative 0 0 - 1 %   Basophils Absolute 0.0 0.0 - 0.1 K/uL    ABGS No results for input(s): PHART, PO2ART, TCO2, HCO3 in the last 72 hours.  Invalid input(s): PCO2 CULTURES Recent Results (from the past 240 hour(s))  MRSA PCR Screening     Status: None   Collection Time: 10/01/14  3:00 AM  Result Value Ref Range Status   MRSA by PCR NEGATIVE NEGATIVE Final    Comment:        The GeneXpert MRSA Assay (FDA approved for NASAL specimens only), is one component of a comprehensive MRSA colonization surveillance program. It is not intended to diagnose MRSA infection nor to guide or monitor treatment for MRSA infections.   Clostridium Difficile by PCR     Status: Abnormal   Collection Time: 10/02/14  2:43 PM  Result Value Ref Range Status   C difficile by pcr  POSITIVE (A) NEGATIVE Final    Comment: CRITICAL RESULT CALLED TO, READ BACK BY AND VERIFIED WITH: SCHENOWITZ,L AT 5:30PM ON 10/02/14 BY Derrill Memo    Studies/Results: Dg Hip Operative  Unilat With Pelvis Right  10/08/2014   CLINICAL DATA:  C-arm right hip in the OR. Fluoroscopy time 38 seconds.  EXAM: OPERATIVE RIGHT HIP (WITH PELVIS IF PERFORMED) 4 VIEWS  TECHNIQUE: Fluoroscopic spot image(s) were submitted for interpretation post-operatively.  COMPARISON:  None.  FINDINGS: Images are submitted, showing lag screw and sideplate fixation of intertrochanteric fracture. No new fractures are identified. No evidence for dislocation on images provided.  IMPRESSION: ORIF right hip.   Electronically Signed   By: Nolon Nations M.D.   On: 10/08/2014 17:35    Medications:  Prior to Admission:  Prescriptions prior to admission  Medication Sig Dispense Refill Last Dose  . benazepril (LOTENSIN) 20 MG tablet Take 20 mg by mouth daily.   09/30/2014 at Unknown time  . clopidogrel (PLAVIX) 75 MG tablet Take 75 mg by mouth daily.   09/30/2014 at Unknown time  . donepezil (ARICEPT) 10 MG tablet Take 20 mg by mouth at bedtime.   09/29/2014  . gabapentin (NEURONTIN) 400 MG capsule Take 400 mg by mouth 4 (four) times daily.   09/30/2014 at Unknown time  . HYDROcodone-acetaminophen (NORCO) 10-325 MG per tablet Take 1 tablet by mouth 4 (four) times daily.   09/30/2014 at Unknown time  . sertraline (ZOLOFT) 100 MG tablet Take 200 mg by mouth daily.    09/30/2014 at Unknown time  . simvastatin (ZOCOR) 40 MG tablet Take 40 mg by mouth at bedtime.    09/29/2014  . zolpidem (AMBIEN) 10 MG tablet Take 5 mg by mouth at bedtime.   5 09/30/2014 at Unknown time  . doxepin (SINEQUAN) 150 MG capsule Take 150 mg by mouth at bedtime.   11 09/29/2014   Scheduled: . sodium chloride   Intravenous Once  . albuterol  2.5 mg Nebulization Q6H  . digoxin  0.125 mg Oral Daily  . enoxaparin (LOVENOX) injection  40 mg Subcutaneous Q24H  . metoprolol tartrate  50 mg Oral 4 times per day  . metroNIDAZOLE  500 mg Oral Q8H   Continuous:  HKU:[id number], LORazepam, magnesium hydroxide, methocarbamol **OR** methocarbamol  (ROBAXIN)  IV, morphine injection  Assesment: She had atrial fibrillation with rapid ventricular response and is back having more of that. I think some of this is related to pain. She had her hip surgery yesterday. She is reluctant to get and I have encouraged her to do that. She has dementia which I think is about the same. She has C. difficile colitis and that is improving Principal Problem:   Hip fracture, right Active Problems:   Anxiety state   Essential hypertension   Atrial fibrillation with RVR   Fall at home   Atrial fibrillation with rapid ventricular response   Dementia   Clostridium difficile colitis    Plan: I discussed with cardiology and will add low-dose diltiazem.    LOS: 8 days   Cashmere Dingley L 10/09/2014, 9:09 AM

## 2014-10-09 NOTE — Progress Notes (Signed)
Subjective: 1 Day Post-Op Procedure(s) (LRB): OPEN REDUCTION INTERNAL FIXATION RIGHT HIP (Right) Patient reports pain as 4 on 0-10 scale.    Objective: Vital signs in last 24 hours: Temp:  [97.1 F (36.2 C)-98.2 F (36.8 C)] 97.1 F (36.2 C) (02/13 0730) Pulse Rate:  [50-115] 94 (02/13 0700) Resp:  [12-22] 22 (02/13 0726) BP: (123-156)/(57-105) 126/68 mmHg (02/13 0700) SpO2:  [88 %-100 %] 92 % (02/13 0743) Weight:  [90 kg (198 lb 6.6 oz)] 90 kg (198 lb 6.6 oz) (02/13 0500)  Intake/Output from previous day: 02/12 0701 - 02/13 0700 In: 732.7 [I.V.:732.7] Out: 1320 [Urine:1050; Drains:120; Blood:150] Intake/Output this shift:     Recent Labs  10/07/14 0418 10/08/14 0419 10/09/14 0459  HGB 12.4 12.7 12.5    Recent Labs  10/08/14 0419 10/09/14 0459  WBC 8.9 11.2*  RBC 4.70 4.42  HCT 41.2 39.1  PLT 290 302    Recent Labs  10/08/14 0419 10/09/14 0459  NA 141 142  K 3.8 4.1  CL 101 99  CO2 34* 34*  BUN 13 17  CREATININE 0.59 0.59  GLUCOSE 107* 106*  CALCIUM 8.7 8.8   No results for input(s): LABPT, INR in the last 72 hours.  Neurovascular intact Sensation intact distally Intact pulses distally Dorsiflexion/Plantar flexion intact   She had a good night.  Physical therapy is in with her now.  She has been supine for 9 days and is sore with any movement. HGB stable.    Assessment/Plan: 1 Day Post-Op Procedure(s) (LRB): OPEN REDUCTION INTERNAL FIXATION RIGHT HIP (Right) Up with therapy  Barry Culverhouse 10/09/2014, 10:39 AM

## 2014-10-09 NOTE — Op Note (Signed)
Tami [name], Tami [name]                ACCOUNT NO.:  [id number]  MEDICAL RECORD NO.:  [id number]  LOCATION:  IC03                          FACILITY:  [facility]  PHYSICIAN:  J. Sanjuana Kava, M.D. DATE OF BIRTH:  1940-11-08  DATE OF PROCEDURE: DATE OF DISCHARGE:                              OPERATIVE REPORT   PREOPERATIVE DIAGNOSIS:  Intertrochanteric fracture of the right hip.  POSTOPERATIVE DIAGNOSIS:  Intertrochanteric fracture of the right hip.  PROCEDURE:  Open fixation and internal fixation of right hip intertrochanteric fracture using Smith and Nephew hip compression system with a 135 degree short-barrel side plate, 4 screws and a 95 mm compression screw.  ANESTHESIA:  Spinal.  SURGEON:  J. Sanjuana Kava, M.D.  ASSISTANT:  Marquita Palms, RN  ESTIMATED BLOOD LOSS:  150 or less.  DRAINS:  1 large Hemovac.  INDICATIONS:  The patient is a 74 year old female who fell at home.  After admission she developed atrial fib.  She then developed C. Diff.  Surgery has been delayed to take care of these underlying medical issues.  The patient has underlying dementia.  Because of her dementia, because of her age, because of her atrial fib, because of her history of C. Diff, she is at increased risk.  I have gone over the procedure  with the patient's husband including the risks and imponderables of infection, nerve injury, blood loss, need for nursing home placement, anesthesia risks.  He appeared to understand and accepts the procedure.  Procedure:  She was seen in the holding area.  The right hip was marked as the correct surgical site.  She was taken to the operating room and placed on the fracture table.  C-arm fluoroscopy unit was used.  Everyone had on their lead aprons, shields and badges.  She was placed in traction on the table.  She was prepped and draped.  We had a time out identifying the patient as Tami [name] and we were doing ORIF of the right hip.  Everyone knew each other.  All  instrumentation was available and working.  An incision was made through skin, tensor fascia lata, vastus lateralis.  The femoral shaft was identified.  A guide pin was placed with good AP and lateral views.  It measured 100 mm a 95 mm screw was selected.  A step drill was used, then the compression screw was inserted. A 135 short barrel four hole side plate was selected and inserted.  X-rays were taken throughout the procedure. It showed good position of the fixation device.  Hemovac drain was placed, sewn in with 2-0 silk suture.  The vastus lateralis was reapproximated with running #1 Bralon suture. The tensor was closed with #1 Bralon suture.  The adipose layer closed using 2- 0 plain and skin reapproximated with skin staples.   A sterile dressing was  applied.  The patient had C. Diff so the x-ray machine was cleaned using bleach and the bed before was also washed down with bleach.  Anticoagulation will be adjusted by Cardiology after at least 24 hours after healing. She was taken to the recovery area in good status.  She will go back to the ICU.  ______________________________ Lenna Sciara. Sanjuana Kava, M.D.     JWK/MEDQ  D:  10/08/2014  T:  10/09/2014  Job:  [id number]

## 2014-10-09 NOTE — Progress Notes (Signed)
Foley was not removed today at request of patient and is post op day one. Foley will be removed tomorrow and bed pan will be offered unless order is received from doctor to leave foley in place.

## 2014-10-10 LAB — CBC WITH DIFFERENTIAL/PLATELET
BASOS ABS: 0 10*3/uL (ref 0.0–0.1)
BASOS PCT: 0 % (ref 0–1)
EOS ABS: 0 10*3/uL (ref 0.0–0.7)
Eosinophils Relative: 0 % (ref 0–5)
HCT: 38.4 % (ref 36.0–46.0)
Hemoglobin: 12.2 g/dL (ref 12.0–15.0)
LYMPHS ABS: 2 10*3/uL (ref 0.7–4.0)
Lymphocytes Relative: 15 % (ref 12–46)
MCH: 27.5 pg (ref 26.0–34.0)
MCHC: 31.8 g/dL (ref 30.0–36.0)
MCV: 86.7 fL (ref 78.0–100.0)
MONO ABS: 1.4 10*3/uL — AB (ref 0.1–1.0)
Monocytes Relative: 11 % (ref 3–12)
NEUTROS ABS: 10 10*3/uL — AB (ref 1.7–7.7)
NEUTROS PCT: 74 % (ref 43–77)
PLATELETS: 302 10*3/uL (ref 150–400)
RBC: 4.43 MIL/uL (ref 3.87–5.11)
RDW: 14.1 % (ref 11.5–15.5)
WBC: 13.5 10*3/uL — ABNORMAL HIGH (ref 4.0–10.5)

## 2014-10-10 LAB — BASIC METABOLIC PANEL
ANION GAP: 5 (ref 5–15)
BUN: 17 mg/dL (ref 6–23)
CO2: 37 mmol/L — ABNORMAL HIGH (ref 19–32)
CREATININE: 0.64 mg/dL (ref 0.50–1.10)
Calcium: 8.6 mg/dL (ref 8.4–10.5)
Chloride: 96 mmol/L (ref 96–112)
GFR calc Af Amer: >90 mL/min (ref >90)
GFR, EST NON AFRICAN AMERICAN: 85 mL/min — AB (ref >90)
Glucose, Bld: 110 mg/dL — ABNORMAL HIGH (ref 70–99)
Potassium: 4 mmol/L (ref 3.5–5.1)
Sodium: 138 mmol/L (ref 135–145)

## 2014-10-10 LAB — GLUCOSE, CAPILLARY
GLUCOSE-CAPILLARY: 92 mg/dL (ref 70–99)
Glucose-Capillary: 100 mg/dL — ABNORMAL HIGH (ref 70–99)

## 2014-10-10 MED ORDER — DONEPEZIL HCL 5 MG PO TABS
20.0000 mg | ORAL_TABLET | Freq: Every day | ORAL | Status: DC
  Administered 2014-10-10: 20 mg via ORAL
  Filled 2014-10-10: qty 4

## 2014-10-10 MED ORDER — SERTRALINE HCL 50 MG PO TABS
200.0000 mg | ORAL_TABLET | Freq: Every day | ORAL | Status: DC
  Administered 2014-10-10 – 2014-10-11 (×2): 200 mg via ORAL
  Filled 2014-10-10 (×2): qty 4

## 2014-10-10 NOTE — Progress Notes (Signed)
Spoke with pt's family and they have decided that they want pt to go to [facility] for rehab. Darryl (pt's husband) asks that CSW calls him tomorrow with updates on pt pertaining to transferring.

## 2014-10-10 NOTE — Progress Notes (Signed)
Pt transferring to [address] today. Pt/family is aware and agreeable to transfer. Assessment is unchanged from this morning and receiving RN has been given report. Belongings sent with pt at bedside.  

## 2014-10-10 NOTE — Progress Notes (Signed)
Subjective: She says she feels okay. She still confused. She has no other new complaints. Her heart rate looks better  Objective: Vital signs in last 24 hours: Temp:  [97.5 F (36.4 C)-98.4 F (36.9 C)] 97.9 F (36.6 C) (02/14 0730) Pulse Rate:  [63-134] 125 (02/14 0700) Resp:  [18-26] 22 (02/14 0800) BP: (102-157)/(51-104) 139/84 mmHg (02/14 0700) SpO2:  [89 %-96 %] 92 % (02/14 0812) FiO2 (%):  [28 %] 28 % (02/13 2200) Weight:  [92.3 kg (203 lb 7.8 oz)] 92.3 kg (203 lb 7.8 oz) (02/14 0500) Weight change: 2.3 kg (5 lb 1.1 oz) Last BM Date: 10/07/14  Intake/Output from previous day: 02/13 0701 - 02/14 0700 In: 180 [P.O.:180] Out: 1085 [Urine:1075; Drains:10]  PHYSICAL EXAM General appearance: alert, cooperative and no distress Resp: clear to auscultation bilaterally Cardio: irregularly irregular rhythm GI: soft, non-tender; bowel sounds normal; no masses,  no organomegaly Extremities: Post hip surgery  Lab Results:  Results for orders placed or performed during the hospital encounter of 09/30/14 (from the past 48 hour(s))  Prepare RBC     Status: None   Collection Time: 10/08/14  9:10 AM  Result Value Ref Range   Order Confirmation ORDER PROCESSED BY BLOOD BANK   Type and screen     Status: None (Preliminary result)   Collection Time: 10/08/14  9:10 AM  Result Value Ref Range   ABO/RH(D) A POS    Antibody Screen NEG    Sample Expiration 10/11/2014    Unit Number [id number]    Blood Component Type RED CELLS,LR    Unit division 00    Status of Unit ALLOCATED    Transfusion Status OK TO TRANSFUSE    Crossmatch Result Compatible    Unit Number [id number]    Blood Component Type RED CELLS,LR    Unit division 00    Status of Unit ALLOCATED    Transfusion Status OK TO TRANSFUSE    Crossmatch Result Compatible   ABO/Rh     Status: None   Collection Time: 10/08/14  9:15 AM  Result Value Ref Range   ABO/RH(D) A POS   APTT     Status: None   Collection Time:  10/08/14  2:53 PM  Result Value Ref Range   aPTT 26 24 - 37 seconds  Basic metabolic panel     Status: Abnormal   Collection Time: 10/09/14  4:59 AM  Result Value Ref Range   Sodium 142 135 - 145 mmol/L   Potassium 4.1 3.5 - 5.1 mmol/L   Chloride 99 96 - 112 mmol/L   CO2 34 (H) 19 - 32 mmol/L   Glucose, Bld 106 (H) 70 - 99 mg/dL   BUN 17 6 - 23 mg/dL   Creatinine, Ser 0.59 0.50 - 1.10 mg/dL   Calcium 8.8 8.4 - 10.5 mg/dL   GFR calc non Af Amer 87 (L) >90 mL/min   GFR calc Af Amer >90 >90 mL/min    Comment: (NOTE) The eGFR has been calculated using the CKD EPI equation. This calculation has not been validated in all clinical situations. eGFR's persistently <90 mL/min signify possible Chronic Kidney Disease.    Anion gap 9 5 - 15  CBC with Differential/Platelet     Status: Abnormal   Collection Time: 10/09/14  4:59 AM  Result Value Ref Range   WBC 11.2 (H) 4.0 - 10.5 K/uL   RBC 4.42 3.87 - 5.11 MIL/uL   Hemoglobin 12.5 12.0 - 15.0 g/dL   HCT 39.1  36.0 - 46.0 %   MCV 88.5 78.0 - 100.0 fL   MCH 28.3 26.0 - 34.0 pg   MCHC 32.0 30.0 - 36.0 g/dL   RDW 14.2 11.5 - 15.5 %   Platelets 302 150 - 400 K/uL   Neutrophils Relative % 79 (H) 43 - 77 %   Neutro Abs 8.9 (H) 1.7 - 7.7 K/uL   Lymphocytes Relative 12 12 - 46 %   Lymphs Abs 1.3 0.7 - 4.0 K/uL   Monocytes Relative 9 3 - 12 %   Monocytes Absolute 1.0 0.1 - 1.0 K/uL   Eosinophils Relative 0 0 - 5 %   Eosinophils Absolute 0.0 0.0 - 0.7 K/uL   Basophils Relative 0 0 - 1 %   Basophils Absolute 0.0 0.0 - 0.1 K/uL  Digoxin level     Status: None   Collection Time: 10/09/14  9:42 AM  Result Value Ref Range   Digoxin Level 1.4 0.8 - 2.0 ng/mL  Basic metabolic panel     Status: Abnormal   Collection Time: 10/10/14  4:38 AM  Result Value Ref Range   Sodium 138 135 - 145 mmol/L   Potassium 4.0 3.5 - 5.1 mmol/L   Chloride 96 96 - 112 mmol/L   CO2 37 (H) 19 - 32 mmol/L   Glucose, Bld 110 (H) 70 - 99 mg/dL   BUN 17 6 - 23 mg/dL    Creatinine, Ser 0.64 0.50 - 1.10 mg/dL   Calcium 8.6 8.4 - 10.5 mg/dL   GFR calc non Af Amer 85 (L) >90 mL/min   GFR calc Af Amer >90 >90 mL/min    Comment: (NOTE) The eGFR has been calculated using the CKD EPI equation. This calculation has not been validated in all clinical situations. eGFR's persistently <90 mL/min signify possible Chronic Kidney Disease.    Anion gap 5 5 - 15  CBC with Differential/Platelet     Status: Abnormal   Collection Time: 10/10/14  4:38 AM  Result Value Ref Range   WBC 13.5 (H) 4.0 - 10.5 K/uL   RBC 4.43 3.87 - 5.11 MIL/uL   Hemoglobin 12.2 12.0 - 15.0 g/dL   HCT 38.4 36.0 - 46.0 %   MCV 86.7 78.0 - 100.0 fL   MCH 27.5 26.0 - 34.0 pg   MCHC 31.8 30.0 - 36.0 g/dL   RDW 14.1 11.5 - 15.5 %   Platelets 302 150 - 400 K/uL   Neutrophils Relative % 74 43 - 77 %   Neutro Abs 10.0 (H) 1.7 - 7.7 K/uL   Lymphocytes Relative 15 12 - 46 %   Lymphs Abs 2.0 0.7 - 4.0 K/uL   Monocytes Relative 11 3 - 12 %   Monocytes Absolute 1.4 (H) 0.1 - 1.0 K/uL   Eosinophils Relative 0 0 - 5 %   Eosinophils Absolute 0.0 0.0 - 0.7 K/uL   Basophils Relative 0 0 - 1 %   Basophils Absolute 0.0 0.0 - 0.1 K/uL    ABGS No results for input(s): PHART, PO2ART, TCO2, HCO3 in the last 72 hours.  Invalid input(s): PCO2 CULTURES Recent Results (from the past 240 hour(s))  MRSA PCR Screening     Status: None   Collection Time: 10/01/14  3:00 AM  Result Value Ref Range Status   MRSA by PCR NEGATIVE NEGATIVE Final    Comment:        The GeneXpert MRSA Assay (FDA approved for NASAL specimens only), is one component of a comprehensive  MRSA colonization surveillance program. It is not intended to diagnose MRSA infection nor to guide or monitor treatment for MRSA infections.   Clostridium Difficile by PCR     Status: Abnormal   Collection Time: 10/02/14  2:43 PM  Result Value Ref Range Status   C difficile by pcr POSITIVE (A) NEGATIVE Final    Comment: CRITICAL RESULT CALLED  TO, READ BACK BY AND VERIFIED WITH: SCHENOWITZ,L AT 5:30PM ON 10/02/14 BY Derrill Memo    Studies/Results: Dg Hip Operative Unilat With Pelvis Right  10/08/2014   CLINICAL DATA:  C-arm right hip in the OR. Fluoroscopy time 38 seconds.  EXAM: OPERATIVE RIGHT HIP (WITH PELVIS IF PERFORMED) 4 VIEWS  TECHNIQUE: Fluoroscopic spot image(s) were submitted for interpretation post-operatively.  COMPARISON:  None.  FINDINGS: Images are submitted, showing lag screw and sideplate fixation of intertrochanteric fracture. No new fractures are identified. No evidence for dislocation on images provided.  IMPRESSION: ORIF right hip.   Electronically Signed   By: Nolon Nations M.D.   On: 10/08/2014 17:35    Medications:  Prior to Admission:  Prescriptions prior to admission  Medication Sig Dispense Refill Last Dose  . benazepril (LOTENSIN) 20 MG tablet Take 20 mg by mouth daily.   09/30/2014 at Unknown time  . clopidogrel (PLAVIX) 75 MG tablet Take 75 mg by mouth daily.   09/30/2014 at Unknown time  . donepezil (ARICEPT) 10 MG tablet Take 20 mg by mouth at bedtime.   09/29/2014  . gabapentin (NEURONTIN) 400 MG capsule Take 400 mg by mouth 4 (four) times daily.   09/30/2014 at Unknown time  . HYDROcodone-acetaminophen (NORCO) 10-325 MG per tablet Take 1 tablet by mouth 4 (four) times daily.   09/30/2014 at Unknown time  . sertraline (ZOLOFT) 100 MG tablet Take 200 mg by mouth daily.    09/30/2014 at Unknown time  . simvastatin (ZOCOR) 40 MG tablet Take 40 mg by mouth at bedtime.    09/29/2014  . zolpidem (AMBIEN) 10 MG tablet Take 5 mg by mouth at bedtime.   5 09/30/2014 at Unknown time  . doxepin (SINEQUAN) 150 MG capsule Take 150 mg by mouth at bedtime.   11 09/29/2014   Scheduled: . sodium chloride   Intravenous Once  . albuterol  2.5 mg Nebulization Q6H  . digoxin  0.125 mg Oral Daily  . diltiazem  30 mg Oral 4 times per day  . enoxaparin (LOVENOX) injection  40 mg Subcutaneous Q24H  . metoprolol tartrate  50 mg Oral 4  times per day   Continuous:  [id number], LORazepam, magnesium hydroxide, methocarbamol **OR** methocarbamol (ROBAXIN)  IV, morphine injection  Assesment: She was admitted with a right hip fracture. She was found to have atrial fibrillation with rapid ventricular response and that has been fairly difficult to get control. She is now on metoprolol and diltiazem. She was found to have Clostridium difficile colitis and has finished treatment for that. She looks pretty stable and from a medical point of view she can be transferred from the ICU Principal Problem:   Hip fracture, right Active Problems:   Anxiety state   Essential hypertension   Atrial fibrillation with RVR   Fall at home   Atrial fibrillation with rapid ventricular response   Dementia   Clostridium difficile colitis    Plan: Transfer from the ICU. I discussed anticoagulation with pharmacy and the plan is to put her on eliquis when okayed by her orthopedic surgeon    LOS: 9 days  Lynkin Saini L 10/10/2014, 9:00 AM

## 2014-10-10 NOTE — Progress Notes (Signed)
Subjective: 2 Days Post-Op Procedure(s) (LRB): OPEN REDUCTION INTERNAL FIXATION RIGHT HIP (Right) Patient reports pain as mild.    Objective: Vital signs in last 24 hours: Temp:  [97.5 F (36.4 C)-98.4 F (36.9 C)] 97.9 F (36.6 C) (02/14 0730) Pulse Rate:  [63-134] 116 (02/14 0914) Resp:  [18-26] 26 (02/14 0900) BP: (102-157)/(51-101) 127/101 mmHg (02/14 0900) SpO2:  [89 %-96 %] 94 % (02/14 0900) FiO2 (%):  [28 %] 28 % (02/13 2200) Weight:  [92.3 kg (203 lb 7.8 oz)] 92.3 kg (203 lb 7.8 oz) (02/14 0500)  Intake/Output from previous day: 02/13 0701 - 02/14 0700 In: 180 [P.O.:180] Out: 1085 [Urine:1075; Drains:10] Intake/Output this shift:     Recent Labs  10/08/14 0419 10/09/14 0459 10/10/14 0438  HGB 12.7 12.5 12.2    Recent Labs  10/09/14 0459 10/10/14 0438  WBC 11.2* 13.5*  RBC 4.42 4.43  HCT 39.1 38.4  PLT 302 302    Recent Labs  10/09/14 0459 10/10/14 0438  NA 142 138  K 4.1 4.0  CL 99 96  CO2 34* 37*  BUN 17 17  CREATININE 0.59 0.64  GLUCOSE 106* 110*  CALCIUM 8.8 8.6   No results for input(s): LABPT, INR in the last 72 hours.  Neurovascular intact Sensation intact distally Intact pulses distally Dorsiflexion/Plantar flexion intact Incision: scant drainage   Labs are good.  Hemovac removed.  Foley to be removed.  Wound is good.  Continue PT.  She should be ready for discharge to SNF tomorrow or following day.  Assessment/Plan: 2 Days Post-Op Procedure(s) (LRB): OPEN REDUCTION INTERNAL FIXATION RIGHT HIP (Right) Up with therapy  Tami [name] 10/10/2014, 10:08 AM

## 2014-10-11 ENCOUNTER — Encounter (HOSPITAL_COMMUNITY): Payer: Self-pay | Admitting: Orthopaedic Surgery

## 2014-10-11 ENCOUNTER — Inpatient Hospital Stay
Admission: RE | Admit: 2014-10-11 | Discharge: 2014-10-31 | Disposition: A | Discharge status: Home / Self Care | Payer: Commercial Managed Care - HMO | Source: Ambulatory Visit | Attending: Pulmonary Disease | Admitting: Pulmonary Disease

## 2014-10-11 DIAGNOSIS — R262 Difficulty in walking, not elsewhere classified: Secondary | ICD-10-CM | POA: Diagnosis not present

## 2014-10-11 DIAGNOSIS — E785 Hyperlipidemia, unspecified: Secondary | ICD-10-CM | POA: Diagnosis not present

## 2014-10-11 DIAGNOSIS — S72009A Fracture of unspecified part of neck of unspecified femur, initial encounter for closed fracture: Secondary | ICD-10-CM | POA: Diagnosis not present

## 2014-10-11 DIAGNOSIS — F329 Major depressive disorder, single episode, unspecified: Secondary | ICD-10-CM | POA: Diagnosis not present

## 2014-10-11 DIAGNOSIS — F411 Generalized anxiety disorder: Secondary | ICD-10-CM | POA: Diagnosis not present

## 2014-10-11 DIAGNOSIS — J449 Chronic obstructive pulmonary disease, unspecified: Secondary | ICD-10-CM | POA: Diagnosis not present

## 2014-10-11 DIAGNOSIS — A047 Enterocolitis due to Clostridium difficile: Secondary | ICD-10-CM | POA: Diagnosis not present

## 2014-10-11 DIAGNOSIS — I10 Essential (primary) hypertension: Secondary | ICD-10-CM | POA: Diagnosis not present

## 2014-10-11 DIAGNOSIS — C539 Malignant neoplasm of cervix uteri, unspecified: Secondary | ICD-10-CM | POA: Diagnosis not present

## 2014-10-11 DIAGNOSIS — I4891 Unspecified atrial fibrillation: Secondary | ICD-10-CM | POA: Diagnosis not present

## 2014-10-11 DIAGNOSIS — M6281 Muscle weakness (generalized): Secondary | ICD-10-CM | POA: Diagnosis not present

## 2014-10-11 DIAGNOSIS — R278 Other lack of coordination: Secondary | ICD-10-CM | POA: Diagnosis not present

## 2014-10-11 LAB — CBC WITH DIFFERENTIAL/PLATELET
Basophils Absolute: 0 10*3/uL (ref 0.0–0.1)
Basophils Relative: 0 % (ref 0–1)
Eosinophils Absolute: 0 10*3/uL (ref 0.0–0.7)
Eosinophils Relative: 0 % (ref 0–5)
HCT: 39.8 % (ref 36.0–46.0)
HEMOGLOBIN: 12.5 g/dL (ref 12.0–15.0)
LYMPHS ABS: 1.9 10*3/uL (ref 0.7–4.0)
Lymphocytes Relative: 18 % (ref 12–46)
MCH: 27.5 pg (ref 26.0–34.0)
MCHC: 31.4 g/dL (ref 30.0–36.0)
MCV: 87.5 fL (ref 78.0–100.0)
Monocytes Absolute: 1.3 10*3/uL — ABNORMAL HIGH (ref 0.1–1.0)
Monocytes Relative: 12 % (ref 3–12)
Neutro Abs: 7.7 10*3/uL (ref 1.7–7.7)
Neutrophils Relative %: 70 % (ref 43–77)
PLATELETS: 326 10*3/uL (ref 150–400)
RBC: 4.55 MIL/uL (ref 3.87–5.11)
RDW: 14.3 % (ref 11.5–15.5)
WBC: 11 10*3/uL — ABNORMAL HIGH (ref 4.0–10.5)

## 2014-10-11 LAB — GLUCOSE, CAPILLARY
GLUCOSE-CAPILLARY: 103 mg/dL — AB (ref 70–99)
Glucose-Capillary: 100 mg/dL — ABNORMAL HIGH (ref 70–99)

## 2014-10-11 LAB — BASIC METABOLIC PANEL
ANION GAP: 10 (ref 5–15)
BUN: 19 mg/dL (ref 6–23)
CALCIUM: 8.7 mg/dL (ref 8.4–10.5)
CO2: 35 mmol/L — AB (ref 19–32)
Chloride: 96 mmol/L (ref 96–112)
Creatinine, Ser: 0.56 mg/dL (ref 0.50–1.10)
GFR calc Af Amer: >90 mL/min (ref >90)
GFR calc non Af Amer: 89 mL/min — ABNORMAL LOW (ref >90)
Glucose, Bld: 111 mg/dL — ABNORMAL HIGH (ref 70–99)
Potassium: 3.6 mmol/L (ref 3.5–5.1)
Sodium: 141 mmol/L (ref 135–145)

## 2014-10-11 MED ORDER — DIGOXIN 125 MCG PO TABS: 0.1250 mg | ORAL_TABLET | Freq: Every day | ORAL | Status: DC

## 2014-10-11 MED ORDER — ALBUTEROL SULFATE (2.5 MG/3ML) 0.083% IN NEBU: 2.5000 mg | INHALATION_SOLUTION | RESPIRATORY_TRACT | Status: DC | PRN

## 2014-10-11 MED ORDER — APIXABAN 5 MG PO TABS
5.0000 mg | ORAL_TABLET | Freq: Two times a day (BID) | ORAL | Status: DC
  Administered 2014-10-11: 5 mg via ORAL
  Filled 2014-10-11: qty 1

## 2014-10-11 MED ORDER — LORAZEPAM 0.5 MG PO TABS: 0.5000 mg | ORAL_TABLET | Freq: Four times a day (QID) | ORAL | Status: AC | PRN

## 2014-10-11 MED ORDER — METOPROLOL TARTRATE 50 MG PO TABS: 50.0000 mg | ORAL_TABLET | Freq: Four times a day (QID) | ORAL | Status: DC

## 2014-10-11 MED ORDER — ALBUTEROL SULFATE (2.5 MG/3ML) 0.083% IN NEBU: 2.5000 mg | INHALATION_SOLUTION | RESPIRATORY_TRACT | Status: AC | PRN

## 2014-10-11 MED ORDER — METHOCARBAMOL 500 MG PO TABS: 500.0000 mg | ORAL_TABLET | Freq: Four times a day (QID) | ORAL | Status: AC | PRN

## 2014-10-11 MED ORDER — APIXABAN 5 MG PO TABS: 5.0000 mg | ORAL_TABLET | Freq: Two times a day (BID) | ORAL | Status: DC

## 2014-10-11 MED ORDER — DILTIAZEM HCL ER COATED BEADS 120 MG PO CP24: 120.0000 mg | ORAL_CAPSULE | Freq: Every day | ORAL | Status: DC

## 2014-10-11 NOTE — Progress Notes (Signed)
Patient ID: Tami [name], female   DOB: [birth year], 74 y.o.   MRN: [id number]    Primary cardiologist: Dr. Kate Sable  Seen for followup: Atrial fibrillation  Subjective:    No palpitations or chest pain. Confused/Dementia  Objective:   Temp:  [97.3 F (36.3 C)-98.3 F (36.8 C)] 97.3 F (36.3 C) (02/15 0532) Pulse Rate:  [97-102] 102 (02/15 0532) Resp:  [20] 20 (02/15 0532) BP: (124-135)/(63-94) 131/89 mmHg (02/15 1245) SpO2:  [91 %-96 %] 93 % (02/15 0532) Last BM Date: 10/07/14  Filed Weights   10/08/14 0500 10/09/14 0500 10/10/14 0500  Weight: 91.5 kg (201 lb 11.5 oz) 90 kg (198 lb 6.6 oz) 92.3 kg (203 lb 7.8 oz)    Intake/Output Summary (Last 24 hours) at 10/11/14 1248 Last data filed at 10/11/14 0706  Gross per 24 hour  Intake    120 ml  Output   1400 ml  Net  -1280 ml    Telemetry: Persistent atrial fibrillation, heart rate 80-100 overall.  Exam:  General: No distress.  Lungs: Decreased breath sounds, nonlabored.  Cardiac: Irregularly irregular.  Extremities: No pitting edema.  Lab Results:  Basic Metabolic Panel:  Recent Labs Lab 10/09/14 0459 10/10/14 0438 10/11/14 0602  NA 142 138 141  K 4.1 4.0 3.6  CL 99 96 96  CO2 34* 37* 35*  GLUCOSE 106* 110* 111*  BUN 17 17 19   CREATININE 0.59 0.64 0.56  CALCIUM 8.8 8.6 8.7     CBC:  Recent Labs Lab 10/09/14 0459 10/10/14 0438 10/11/14 0602  WBC 11.2* 13.5* 11.0*  HGB 12.5 12.2 12.5  HCT 39.1 38.4 39.8  MCV 88.5 86.7 87.5  PLT 302 302 326    Echocardiogram 10/01/14: Study Conclusions  - Procedure narrative: Transthoracic echocardiography. Image quality was suboptimal. The study was technically difficult, as a result of poor sound wave transmission, restricted patient mobility, and body habitus. - Left ventricle: The cavity size was normal. Systolic function was normal. The estimated ejection fraction was in the range of 60% to 65%. Images were inadequate for LV wall  motion assessment. The study was not technically sufficient to allow evaluation of LV diastolic dysfunction due to atrial fibrillation. There was no evidence of elevated ventricular filling pressure by Doppler parameters. Moderate to severe concentric left ventricular hypertrophy. - Aortic valve: Mildly calcified annulus. Moderately thickened, mildly calcified leaflets. There was no stenosis. There was trivial regurgitation. Mean gradient (S): 3 mm Hg. - Aorta: Mild ascending aortic dilatation, maximum diameter 3.74 cm. Mild aortic root dilatation. Aortic root dimension: 42 mm (ED). - Mitral valve: Mildly calcified annulus. Normal thickness leaflets . There was mild to moderate regurgitation. - Left atrium: The atrium was moderately to severely dilated. - Right atrium: The atrium was mildly dilated. - Tricuspid valve: There was mild-moderate regurgitation. - Pulmonary arteries: PA peak pressure: 40 mm Hg (S). Mildly elevated pulmonary pressures. - Inferior vena cava: The vessel was dilated. The respirophasic diameter changes were blunted (< 50%), consistent with elevated central venous pressure.   Medications:   Scheduled Medications: . sodium chloride   Intravenous Once  . apixaban  5 mg Oral BID  . digoxin  0.125 mg Oral Daily  . diltiazem  30 mg Oral 4 times per day  . donepezil  20 mg Oral QHS  . metoprolol tartrate  50 mg Oral 4 times per day  . sertraline  200 mg Oral Daily    Infusions:    PRN Medications: albuterol, HYDROcodone-acetaminophen, LORazepam,  magnesium hydroxide, methocarbamol **OR** methocarbamol (ROBAXIN)  IV, morphine injection   Assessment:   1. Newly diagnosed persistent atrial fibrillation, CHADSVASC score 6.  On eliquis 5 bid rate control fine on lopressor   2. Essential hypertension.  3. Right hip fracture following mechanical fall. Surgery anticipated later today.  4. Clostridium difficile  colitis.   Plan/Discussion:    To SNF when bed available

## 2014-10-11 NOTE — Clinical Social Work Placement (Signed)
Clinical Social Work Department CLINICAL SOCIAL WORK PLACEMENT NOTE 10/11/2014  Patient:  Tami [name], Tami [name]  Account Number:  [id number] Admit date:  09/30/2014  Clinical Social Worker:  Benay Pike, LCSW  Date/time:  10/11/2014 10:05 AM  Clinical Social Work is seeking post-discharge placement for this patient at the following level of care:   [place]   (*CSW will update this form in Epic as items are completed)   10/11/2014  Patient/family provided with [place] Department of Clinical Social Work's list of facilities offering this level of care within the geographic area requested by the patient (or if unable, by the patient's family).  10/11/2014  Patient/family informed of their freedom to choose among providers that offer the needed level of care, that participate in Medicare, Medicaid or managed care program needed by the patient, have an available bed and are willing to accept the patient.  10/11/2014  Patient/family informed of [facility]' ownership interest in [facility], as well as of the fact that they are under no obligation to receive care at this facility.  PASARR submitted to EDS on 10/11/2014 PASARR number received on 10/11/2014  FL2 transmitted to all facilities in geographic area requested by pt/family on  10/11/2014 FL2 transmitted to all facilities within larger geographic area on   Patient informed that his/her managed care company has contracts with or will negotiate with  certain facilities, including the following:     Patient/family informed of bed offers received:   Patient chooses bed at  Physician recommends and patient chooses bed at    Patient to be transferred to  on   Patient to be transferred to facility by  Patient and family notified of transfer on  Name of family member notified:    The following physician request were entered in Epic:   Additional Comments:  Benay Pike, [place]

## 2014-10-11 NOTE — Progress Notes (Signed)
Subjective: 3 Days Post-Op Procedure(s) (LRB): OPEN REDUCTION INTERNAL FIXATION RIGHT HIP (Right) Patient reports pain as mild.    Objective: Vital signs in last 24 hours: Temp:  [97.3 F (36.3 C)-99.4 F (37.4 C)] 97.3 F (36.3 C) (02/15 0532) Pulse Rate:  [94-122] 102 (02/15 0532) Resp:  [20-24] 20 (02/15 0532) BP: (124-137)/(63-95) 124/83 mmHg (02/15 0532) SpO2:  [91 %-96 %] 93 % (02/15 0532)  Intake/Output from previous day: 02/14 0701 - 02/15 0700 In: 120 [P.O.:120] Out: -  Intake/Output this shift: Total I/O In: -  Out: 1400 [Urine:1400]   Recent Labs  10/09/14 0459 10/10/14 0438 10/11/14 0602  HGB 12.5 12.2 12.5    Recent Labs  10/10/14 0438 10/11/14 0602  WBC 13.5* 11.0*  RBC 4.43 4.55  HCT 38.4 39.8  PLT 302 326    Recent Labs  10/10/14 0438 10/11/14 0602  NA 138 141  K 4.0 3.6  CL 96 96  CO2 37* 35*  BUN 17 19  CREATININE 0.64 0.56  GLUCOSE 110* 111*  CALCIUM 8.6 8.7   No results for input(s): LABPT, INR in the last 72 hours.  Neurovascular intact Sensation intact distally Intact pulses distally Dorsiflexion/Plantar flexion intact Incision: no drainage  Assessment/Plan: 3 Days Post-Op Procedure(s) (LRB): OPEN REDUCTION INTERNAL FIXATION RIGHT HIP (Right) Up with therapy   She can be discharged to SNF or home with home health.  I would prefer SNF.  She will need toe touch weight bearing with walker on the right.  She will need to continue the anticoagulation as far as the hip goes for at least one month.  She will need staples removed from the hip Wednesday, February 24th and Steri-strip wound.  I need to see her in my office in one month.  X-rays of the hip prior to the appointment if she is in a nursing home.   Yovana Scogin 10/11/2014, 9:13 AM

## 2014-10-11 NOTE — Clinical Social Work Note (Signed)
Pt and pt's husband accept bed offer at [facility]. Pt d/c today and will transfer with RN. LaSandra with [facility]) states authorization will be completed this afternoon and called to [facility].    Tami [name], [place]

## 2014-10-11 NOTE — Clinical Social Work Placement (Signed)
Clinical Social Work Department CLINICAL SOCIAL WORK PLACEMENT NOTE 10/11/2014  Patient:  YASLIN, KIRTLEY  Account Number:  [id number] Admit date:  09/30/2014  Clinical Social Worker:  Benay Pike, LCSW  Date/time:  10/11/2014 10:05 AM  Clinical Social Work is seeking post-discharge placement for this patient at the following level of care:   [place]   (*CSW will update this form in Epic as items are completed)   10/11/2014  Patient/family provided with [place] Department of Clinical Social Work's list of facilities offering this level of care within the geographic area requested by the patient (or if unable, by the patient's family).  10/11/2014  Patient/family informed of their freedom to choose among providers that offer the needed level of care, that participate in Medicare, Medicaid or managed care program needed by the patient, have an available bed and are willing to accept the patient.  10/11/2014  Patient/family informed of [facility]' ownership interest in [facility], as well as of the fact that they are under no obligation to receive care at this facility.  PASARR submitted to EDS on 10/11/2014 PASARR number received on 10/11/2014  FL2 transmitted to all facilities in geographic area requested by pt/family on  10/11/2014 FL2 transmitted to all facilities within larger geographic area on   Patient informed that his/her managed care company has contracts with or will negotiate with  certain facilities, including the following:     Patient/family informed of bed offers received:  10/11/2014 Patient chooses bed at [facility] Physician recommends and patient chooses bed at    Patient to be transferred to [facility] on  10/11/2014 Patient to be transferred to facility by RN Patient and family notified of transfer on 10/11/2014 Name of family member notified:  [name]- husband  The following physician request were entered in  Epic:   Additional Comments:  Benay Pike, [place]

## 2014-10-11 NOTE — Progress Notes (Signed)
Subjective: She is overall about the same. I discussed her situation with Dr. Luna Glasgow. She is ready for transfer to the skilled care facility. Her heart rate is better controlled.  Objective: Vital signs in last 24 hours: Temp:  [97.3 F (36.3 C)-99.4 F (37.4 C)] 97.3 F (36.3 C) (02/15 0532) Pulse Rate:  [94-122] 102 (02/15 0532) Resp:  [20-24] 20 (02/15 0532) BP: (124-137)/(63-95) 124/83 mmHg (02/15 0532) SpO2:  [91 %-96 %] 93 % (02/15 0532) Weight change:  Last BM Date: 10/07/14  Intake/Output from previous day: 02/14 0701 - 02/15 0700 In: 120 [P.O.:120] Out: -   PHYSICAL EXAM General appearance: alert and Confused Resp: clear to auscultation bilaterally Cardio: irregularly irregular rhythm GI: soft, non-tender; bowel sounds normal; no masses,  no organomegaly Extremities: extremities normal, atraumatic, no cyanosis or edema  Lab Results:  Results for orders placed or performed during the hospital encounter of 09/30/14 (from the past 48 hour(s))  Basic metabolic panel     Status: Abnormal   Collection Time: 10/10/14  4:38 AM  Result Value Ref Range   Sodium 138 135 - 145 mmol/L   Potassium 4.0 3.5 - 5.1 mmol/L   Chloride 96 96 - 112 mmol/L   CO2 37 (H) 19 - 32 mmol/L   Glucose, Bld 110 (H) 70 - 99 mg/dL   BUN 17 6 - 23 mg/dL   Creatinine, Ser 0.64 0.50 - 1.10 mg/dL   Calcium 8.6 8.4 - 10.5 mg/dL   GFR calc non Af Amer 85 (L) >90 mL/min   GFR calc Af Amer >90 >90 mL/min    Comment: (NOTE) The eGFR has been calculated using the CKD EPI equation. This calculation has not been validated in all clinical situations. eGFR's persistently <90 mL/min signify possible Chronic Kidney Disease.    Anion gap 5 5 - 15  CBC with Differential/Platelet     Status: Abnormal   Collection Time: 10/10/14  4:38 AM  Result Value Ref Range   WBC 13.5 (H) 4.0 - 10.5 K/uL   RBC 4.43 3.87 - 5.11 MIL/uL   Hemoglobin 12.2 12.0 - 15.0 g/dL   HCT 38.4 36.0 - 46.0 %   MCV 86.7 78.0 -  100.0 fL   MCH 27.5 26.0 - 34.0 pg   MCHC 31.8 30.0 - 36.0 g/dL   RDW 14.1 11.5 - 15.5 %   Platelets 302 150 - 400 K/uL   Neutrophils Relative % 74 43 - 77 %   Neutro Abs 10.0 (H) 1.7 - 7.7 K/uL   Lymphocytes Relative 15 12 - 46 %   Lymphs Abs 2.0 0.7 - 4.0 K/uL   Monocytes Relative 11 3 - 12 %   Monocytes Absolute 1.4 (H) 0.1 - 1.0 K/uL   Eosinophils Relative 0 0 - 5 %   Eosinophils Absolute 0.0 0.0 - 0.7 K/uL   Basophils Relative 0 0 - 1 %   Basophils Absolute 0.0 0.0 - 0.1 K/uL  Glucose, capillary     Status: None   Collection Time: 10/10/14  4:50 PM  Result Value Ref Range   Glucose-Capillary 92 70 - 99 mg/dL   Comment 1 Notify RN    Comment 2 Document in Chart   Glucose, capillary     Status: Abnormal   Collection Time: 10/10/14 10:11 PM  Result Value Ref Range   Glucose-Capillary 100 (H) 70 - 99 mg/dL   Comment 1 Notify RN   Basic metabolic panel     Status: Abnormal   Collection  Time: 10/11/14  6:02 AM  Result Value Ref Range   Sodium 141 135 - 145 mmol/L   Potassium 3.6 3.5 - 5.1 mmol/L   Chloride 96 96 - 112 mmol/L   CO2 35 (H) 19 - 32 mmol/L   Glucose, Bld 111 (H) 70 - 99 mg/dL   BUN 19 6 - 23 mg/dL   Creatinine, Ser 0.56 0.50 - 1.10 mg/dL   Calcium 8.7 8.4 - 10.5 mg/dL   GFR calc non Af Amer 89 (L) >90 mL/min   GFR calc Af Amer >90 >90 mL/min    Comment: (NOTE) The eGFR has been calculated using the CKD EPI equation. This calculation has not been validated in all clinical situations. eGFR's persistently <90 mL/min signify possible Chronic Kidney Disease.    Anion gap 10 5 - 15  CBC with Differential/Platelet     Status: Abnormal   Collection Time: 10/11/14  6:02 AM  Result Value Ref Range   WBC 11.0 (H) 4.0 - 10.5 K/uL   RBC 4.55 3.87 - 5.11 MIL/uL   Hemoglobin 12.5 12.0 - 15.0 g/dL   HCT 39.8 36.0 - 46.0 %   MCV 87.5 78.0 - 100.0 fL   MCH 27.5 26.0 - 34.0 pg   MCHC 31.4 30.0 - 36.0 g/dL   RDW 14.3 11.5 - 15.5 %   Platelets 326 150 - 400 K/uL    Neutrophils Relative % 70 43 - 77 %   Neutro Abs 7.7 1.7 - 7.7 K/uL   Lymphocytes Relative 18 12 - 46 %   Lymphs Abs 1.9 0.7 - 4.0 K/uL   Monocytes Relative 12 3 - 12 %   Monocytes Absolute 1.3 (H) 0.1 - 1.0 K/uL   Eosinophils Relative 0 0 - 5 %   Eosinophils Absolute 0.0 0.0 - 0.7 K/uL   Basophils Relative 0 0 - 1 %   Basophils Absolute 0.0 0.0 - 0.1 K/uL  Glucose, capillary     Status: Abnormal   Collection Time: 10/11/14  7:19 AM  Result Value Ref Range   Glucose-Capillary 100 (H) 70 - 99 mg/dL   Comment 1 Notify RN    Comment 2 Document in Chart     ABGS No results for input(s): PHART, PO2ART, TCO2, HCO3 in the last 72 hours.  Invalid input(s): PCO2 CULTURES Recent Results (from the past 240 hour(s))  Clostridium Difficile by PCR     Status: Abnormal   Collection Time: 10/02/14  2:43 PM  Result Value Ref Range Status   C difficile by pcr POSITIVE (A) NEGATIVE Final    Comment: CRITICAL RESULT CALLED TO, READ BACK BY AND VERIFIED WITH: SCHENOWITZ,L AT 5:30PM ON 10/02/14 BY FESTERMAN,C    Studies/Results: No results found.  Medications:  Prior to Admission:  Prescriptions prior to admission  Medication Sig Dispense Refill Last Dose  . benazepril (LOTENSIN) 20 MG tablet Take 20 mg by mouth daily.   09/30/2014 at Unknown time  . clopidogrel (PLAVIX) 75 MG tablet Take 75 mg by mouth daily.   09/30/2014 at Unknown time  . donepezil (ARICEPT) 10 MG tablet Take 20 mg by mouth at bedtime.   09/29/2014  . gabapentin (NEURONTIN) 400 MG capsule Take 400 mg by mouth 4 (four) times daily.   09/30/2014 at Unknown time  . HYDROcodone-acetaminophen (NORCO) 10-325 MG per tablet Take 1 tablet by mouth 4 (four) times daily.   09/30/2014 at Unknown time  . sertraline (ZOLOFT) 100 MG tablet Take 200 mg by mouth daily.  09/30/2014 at Unknown time  . simvastatin (ZOCOR) 40 MG tablet Take 40 mg by mouth at bedtime.    09/29/2014  . zolpidem (AMBIEN) 10 MG tablet Take 5 mg by mouth at bedtime.   5  09/30/2014 at Unknown time  . doxepin (SINEQUAN) 150 MG capsule Take 150 mg by mouth at bedtime.   11 09/29/2014   Scheduled: . sodium chloride   Intravenous Once  . apixaban  5 mg Oral BID  . digoxin  0.125 mg Oral Daily  . diltiazem  30 mg Oral 4 times per day  . donepezil  20 mg Oral QHS  . metoprolol tartrate  50 mg Oral 4 times per day  . sertraline  200 mg Oral Daily   Continuous:  ARE:QJEADGNPH, HYDROcodone-acetaminophen, LORazepam, magnesium hydroxide, methocarbamol **OR** methocarbamol (ROBAXIN)  IV, morphine injection  Assesment: She was admitted with a right hip fracture. She was found to have atrial fibrillation with rapid ventricular response. Her heart rate was somewhat difficult to control. She is now 3 days out from her surgery and ready for transfer to skilled care facility from a surgical point of view. She also had C. difficile colitis but not a lot of diarrhea and she has finished her treatment for that Principal Problem:   Hip fracture, right Active Problems:   Anxiety state   Essential hypertension   Atrial fibrillation with RVR   Fall at home   Atrial fibrillation with rapid ventricular response   Dementia   Clostridium difficile colitis    Plan: Transfer to skilled care facility when beds available    LOS: 10 days   Tami [name] L 10/11/2014, 9:44 AM

## 2014-10-11 NOTE — Clinical Social Work Note (Signed)
CSW spoke with pt's husband regarding SNF and he requests [facility] only at this point. CSW will send referral and follow up.  Tami [name], [place]

## 2014-10-11 NOTE — Progress Notes (Signed)
Late Entry:  Report called to [place] at [place] at the [facility]  She verbalized understanding and voiced no further complaints or concerns.  The patient was transferred via bed with staff and belongings on 2 liters via n/c oxygen.  Once she got to the unit I notified Nira Conn Bullins,Rn that I had to keep the patients O2  Due to the sats being only 89% on room air.  She verbalized understanding and got the patient a concentrator for the bedside.   Voiced to Nira Conn that in transport the patient was incontinent episode of urine.  I asked her if she wanted [place] to help her clean the patient, she stated that they would take care of her there.  The packet was also given to the secretary at the desk.

## 2014-10-11 NOTE — Discharge Instructions (Addendum)
[facility] Stay Proper nutrition can help your body recover from illness and injury.   Foods and beverages high in protein, vitamins, and minerals help rebuild muscle loss, promote healing, & reduce fall risk.   In addition to eating healthy foods, a nutrition shake is an easy, delicious way to get the nutrition you need during and after your hospital stay  It is recommended that you continue to drink 2 bottles per day of:    Glucerna for at least 1 month (30 days) after your hospital stay due to poor po intake (10 days).  Tips for adding a nutrition shake into your routine:  As allowed, drink one with vitamins or medications instead of water or juice Enjoy one as a tasty mid-morning or afternoon snack Drink cold or make a milkshake out of it Drink one instead of milk with cereal or snacks Use as a coffee creamer   Available at the following grocery stores and pharmacies:           * [employer] [phone]            For COUPONS visit: www.ensure.com/join or http://dawson-may.com/   Suggested Substitutions Ensure Plus = Boost Plus = Carnation Breakfast Essentials = Boost Compact Ensure Active Clear = Boost Breeze Glucerna Shake = Boost Glucose Control = Carnation Breakfast Essentials SUGAR FREE

## 2014-10-11 NOTE — Progress Notes (Signed)
ANTICOAGULATION CONSULT NOTE - Initial Consult  Pharmacy Consult for Eliquis Indication: atrial fibrillation  Allergies  Allergen Reactions  . Penicillins Other (See Comments)    Child hood allergy(unknown reaction)   Patient Measurements: Height: 5\' 9"  (175.3 cm) Weight: 203 lb 7.8 oz (92.3 kg) IBW/kg (Calculated) : 66.2  Vital Signs: Temp: 97.3 F (36.3 C) (02/15 0532) Temp Source: Oral (02/15 0532) BP: 124/83 mmHg (02/15 0532) Pulse Rate: 102 (02/15 0532)  Labs:  Recent Labs  10/08/14 1453  10/09/14 0459 10/10/14 0438 10/11/14 0602  HGB  --   < > 12.5 12.2 12.5  HCT  --   --  39.1 38.4 39.8  PLT  --   --  302 302 326  APTT 26  --   --   --   --   CREATININE  --   --  0.59 0.64 0.56  < > = values in this interval not displayed.  Estimated Creatinine Clearance: 73.5 mL/min (by C-G formula based on Cr of 0.56).  Medical History: Past Medical History  Diagnosis Date  . Arthritis   . Cancer Cervical  . Hypertension   . Stroke   . Anxiety    Medications:  Scheduled:  . sodium chloride   Intravenous Once  . apixaban  5 mg Oral BID  . digoxin  0.125 mg Oral Daily  . diltiazem  30 mg Oral 4 times per day  . donepezil  20 mg Oral QHS  . metoprolol tartrate  50 mg Oral 4 times per day  . sertraline  200 mg Oral Daily   Assessment: 74yo female s/p ORIF right hip.  Asked to start pt on Eliquis for afib.  Pt does have h/o dementia.   Estimated Creatinine Clearance: 73.5 mL/min (by C-G formula based on Cr of 0.56).  Goal of Therapy:  Full anticoagulation for afib Monitor platelets by anticoagulation protocol: Yes   Plan:  Eliquis 5mg  PO bid Educate patient / family if able (due to dementia)  Hart Robinsons A 10/11/2014,9:37 AM

## 2014-10-11 NOTE — Discharge Summary (Signed)
Physician Discharge Summary  Patient ID: Tami [name] MRN: [id number] DOB/AGE: 1941/06/11 74 y.o. Primary Care Physician:Astou Lada L, MD Admit date: 09/30/2014 Discharge date: 10/11/2014    Discharge Diagnoses:   Principal Problem:   Hip fracture, right Active Problems:   Anxiety state   Essential hypertension   Atrial fibrillation with RVR   Fall at home   Atrial fibrillation with rapid ventricular response   Dementia   Clostridium difficile colitis     Medication List    TAKE these medications        albuterol (2.5 MG/3ML) 0.083% nebulizer solution  Commonly known as:  PROVENTIL  Take 3 mLs (2.5 mg total) by nebulization every 4 (four) hours as needed for wheezing or shortness of breath.     apixaban 5 MG Tabs tablet  Commonly known as:  ELIQUIS  Take 1 tablet (5 mg total) by mouth 2 (two) times daily.     benazepril 20 MG tablet  Commonly known as:  LOTENSIN  Take 20 mg by mouth daily.     clopidogrel 75 MG tablet  Commonly known as:  PLAVIX  Take 75 mg by mouth daily.     digoxin 0.125 MG tablet  Commonly known as:  LANOXIN  Take 1 tablet (0.125 mg total) by mouth daily.     diltiazem 120 MG 24 hr capsule  Commonly known as:  CARDIZEM CD  Take 1 capsule (120 mg total) by mouth daily.     donepezil 10 MG tablet  Commonly known as:  ARICEPT  Take 20 mg by mouth at bedtime.     doxepin 150 MG capsule  Commonly known as:  SINEQUAN  Take 150 mg by mouth at bedtime.     gabapentin 400 MG capsule  Commonly known as:  NEURONTIN  Take 400 mg by mouth 4 (four) times daily.     HYDROcodone-acetaminophen 10-325 MG per tablet  Commonly known as:  NORCO  Take 1 tablet by mouth 4 (four) times daily.     LORazepam 0.5 MG tablet  Commonly known as:  ATIVAN  Take 1 tablet (0.5 mg total) by mouth every 6 (six) hours as needed for anxiety.     methocarbamol 500 MG tablet  Commonly known as:  ROBAXIN  Take 1 tablet (500 mg total) by mouth every 6 (six)  hours as needed for muscle spasms.     metoprolol 50 MG tablet  Commonly known as:  LOPRESSOR  Take 1 tablet (50 mg total) by mouth every 6 (six) hours.     sertraline 100 MG tablet  Commonly known as:  ZOLOFT  Take 200 mg by mouth daily.     simvastatin 40 MG tablet  Commonly known as:  ZOCOR  Take 40 mg by mouth at bedtime.     zolpidem 10 MG tablet  Commonly known as:  AMBIEN  Take 5 mg by mouth at bedtime.        Discharged Condition: Improved    Consults: Cardiology/orthopedics  Significant Diagnostic Studies: Dg Chest 1 View  10/01/2014   CLINICAL DATA:  Status post fall. Concern for chest injury. Initial encounter.  EXAM: CHEST  1 VIEW  COMPARISON:  Chest radiograph from 08/09/2004, and CT of the chest performed 08/15/2004  FINDINGS: The lungs are well-aerated. Chronically increased interstitial markings are seen. Peribronchial thickening is noted. There is no evidence of pleural effusion or pneumothorax.  The cardiomediastinal silhouette is borderline enlarged. No acute osseous abnormalities are seen.  IMPRESSION: Chronically increased  interstitial markings seen. Peribronchial thickening noted. Borderline cardiomegaly.   Electronically Signed   By: Garald Balding M.D.   On: 10/01/2014 00:38   Dg Hip Operative Unilat With Pelvis Right  10/08/2014   CLINICAL DATA:  C-arm right hip in the OR. Fluoroscopy time 38 seconds.  EXAM: OPERATIVE RIGHT HIP (WITH PELVIS IF PERFORMED) 4 VIEWS  TECHNIQUE: Fluoroscopic spot image(s) were submitted for interpretation post-operatively.  COMPARISON:  None.  FINDINGS: Images are submitted, showing lag screw and sideplate fixation of intertrochanteric fracture. No new fractures are identified. No evidence for dislocation on images provided.  IMPRESSION: ORIF right hip.   Electronically Signed   By: Nolon Nations M.D.   On: 10/08/2014 17:35   Dg Hip Unilat With Pelvis 2-3 Views Right  10/01/2014   CLINICAL DATA:  Acute onset of severe right hip  pain, status post fall. Initial encounter.  EXAM: RIGHT HIP (WITH PELVIS) 2-3 VIEWS  COMPARISON:  None.  FINDINGS: There is a comminuted intertrochanteric fracture through the proximal right femur, with displaced comminuted lesser trochanteric fragments. There is mild lateral and distal displacement of the distal femur. The right femoral head remains seated at the acetabulum. The left hip joint is grossly unremarkable. The cross-table lateral views are markedly suboptimal due to the patient's habitus.  Mild degenerative change is noted at the lower lumbar spine. The sacroiliac joints are grossly unremarkable in appearance. The visualized bowel gas pattern is grossly unremarkable.  IMPRESSION: Comminuted intertrochanteric fracture through the proximal right femur, with displaced comminuted lesser trochanteric fragments. Mild lateral and distal displacement of the distal femur.   Electronically Signed   By: Garald Balding M.D.   On: 10/01/2014 00:41    Lab Results: Basic Metabolic Panel:  Recent Labs  10/10/14 0438 10/11/14 0602  NA 138 141  K 4.0 3.6  CL 96 96  CO2 37* 35*  GLUCOSE 110* 111*  BUN 17 19  CREATININE 0.64 0.56  CALCIUM 8.6 8.7   Liver Function Tests: No results for input(s): AST, ALT, ALKPHOS, BILITOT, PROT, ALBUMIN in the last 72 hours.   CBC:  Recent Labs  10/10/14 0438 10/11/14 0602  WBC 13.5* 11.0*  NEUTROABS 10.0* 7.7  HGB 12.2 12.5  HCT 38.4 39.8  MCV 86.7 87.5  PLT 302 326    Recent Results (from the past 240 hour(s))  Clostridium Difficile by PCR     Status: Abnormal   Collection Time: 10/02/14  2:43 PM  Result Value Ref Range Status   C difficile by pcr POSITIVE (A) NEGATIVE Final    Comment: CRITICAL RESULT CALLED TO, READ BACK BY AND VERIFIED WITH: SCHENOWITZ,L AT 5:30PM ON 10/02/14 BY [facility] Course: This is a 74 year old who fell at home hurting her right hip. She was found to have an intertrochanteric fracture. She had atrial  fibrillation with rapid ventricular response on admission which is a new finding. She also was found to have C. difficile. She was started on metronidazole for the C. difficile heparin because of the atrial fibrillation and had cardiology and orthopedic consultations. She eventually had her heart rate controlled with the use of metoprolol and by mouth Cardizem and underwent surgery on the 12th. Her heart rate is better and she is overall improved. She has dementia at baseline and is chronically pleasantly confused  Discharge Exam: Blood pressure 124/83, pulse 102, temperature 97.3 F (36.3 C), temperature source Oral, resp. rate 20, height 5\' 9"  (1.753 m), weight 92.3 kg (  203 lb 7.8 oz), SpO2 93 %. She is awake and alert. Her chest is clear. She is still in atrial fibrillation but with well-controlled heart rate  Disposition: To skilled care facility when bed available      Discharge Instructions    Discharge to SNF when bed available    Complete by:  As directed              Signed: Suraiya Dickerson L   10/11/2014, 9:48 AM

## 2014-10-12 ENCOUNTER — Other Ambulatory Visit (HOSPITAL_COMMUNITY)
Admission: AD | Admit: 2014-10-12 | Discharge: 2014-10-12 | Disposition: A | Discharge status: Home / Self Care | Payer: Commercial Managed Care - HMO | Source: Other Acute Inpatient Hospital | Attending: Pulmonary Disease | Admitting: Pulmonary Disease

## 2014-10-12 LAB — TYPE AND SCREEN
ABO/RH(D): A POS
ANTIBODY SCREEN: NEGATIVE
UNIT DIVISION: 0
Unit division: 0

## 2014-10-13 NOTE — Care Management Utilization Note (Signed)
UR completed 

## 2014-10-18 ENCOUNTER — Encounter (HOSPITAL_COMMUNITY)
Admission: RE | Admit: 2014-10-18 | Discharge: 2014-10-18 | Disposition: A | Discharge status: Home / Self Care | Payer: Commercial Managed Care - HMO | Source: Skilled Nursing Facility | Attending: Pulmonary Disease | Admitting: Pulmonary Disease

## 2014-10-18 LAB — CBC WITH DIFFERENTIAL/PLATELET
Basophils Absolute: 0 10*3/uL (ref 0.0–0.1)
Basophils Relative: 0 % (ref 0–1)
Eosinophils Absolute: 0.1 10*3/uL (ref 0.0–0.7)
Eosinophils Relative: 2 % (ref 0–5)
HCT: 36.5 % (ref 36.0–46.0)
Hemoglobin: 11.2 g/dL — ABNORMAL LOW (ref 12.0–15.0)
LYMPHS ABS: 2.3 10*3/uL (ref 0.7–4.0)
Lymphocytes Relative: 25 % (ref 12–46)
MCH: 27.2 pg (ref 26.0–34.0)
MCHC: 30.7 g/dL (ref 30.0–36.0)
MCV: 88.6 fL (ref 78.0–100.0)
MONOS PCT: 14 % — AB (ref 3–12)
Monocytes Absolute: 1.3 10*3/uL — ABNORMAL HIGH (ref 0.1–1.0)
NEUTROS ABS: 5.4 10*3/uL (ref 1.7–7.7)
NEUTROS PCT: 59 % (ref 43–77)
Platelets: 411 10*3/uL — ABNORMAL HIGH (ref 150–400)
RBC: 4.12 MIL/uL (ref 3.87–5.11)
RDW: 14.1 % (ref 11.5–15.5)
WBC: 9.2 10*3/uL (ref 4.0–10.5)

## 2014-10-18 LAB — BASIC METABOLIC PANEL
ANION GAP: 4 — AB (ref 5–15)
BUN: 29 mg/dL — ABNORMAL HIGH (ref 6–23)
CALCIUM: 8.1 mg/dL — AB (ref 8.4–10.5)
CHLORIDE: 97 mmol/L (ref 96–112)
CO2: 35 mmol/L — ABNORMAL HIGH (ref 19–32)
CREATININE: 1.37 mg/dL — AB (ref 0.50–1.10)
GFR, EST AFRICAN AMERICAN: 43 mL/min — AB (ref >90)
GFR, EST NON AFRICAN AMERICAN: 37 mL/min — AB (ref >90)
GLUCOSE: 94 mg/dL (ref 70–99)
POTASSIUM: 3.9 mmol/L (ref 3.5–5.1)
Sodium: 136 mmol/L (ref 135–145)

## 2014-10-22 ENCOUNTER — Other Ambulatory Visit (HOSPITAL_COMMUNITY)
Admission: RE | Admit: 2014-10-22 | Discharge: 2014-10-22 | Disposition: A | Discharge status: Home / Self Care | Payer: Commercial Managed Care - HMO | Source: Skilled Nursing Facility | Attending: Pulmonary Disease | Admitting: Pulmonary Disease

## 2014-10-22 LAB — BASIC METABOLIC PANEL
Anion gap: 4 — ABNORMAL LOW (ref 5–15)
BUN: 19 mg/dL (ref 6–23)
CHLORIDE: 99 mmol/L (ref 96–112)
CO2: 33 mmol/L — ABNORMAL HIGH (ref 19–32)
Calcium: 8.4 mg/dL (ref 8.4–10.5)
Creatinine, Ser: 0.87 mg/dL (ref 0.50–1.10)
GFR calc Af Amer: 74 mL/min — ABNORMAL LOW (ref >90)
GFR calc non Af Amer: 64 mL/min — ABNORMAL LOW (ref >90)
GLUCOSE: 85 mg/dL (ref 70–99)
POTASSIUM: 3.9 mmol/L (ref 3.5–5.1)
Sodium: 136 mmol/L (ref 135–145)

## 2014-10-22 LAB — CBC
HEMATOCRIT: 34.7 % — AB (ref 36.0–46.0)
Hemoglobin: 11 g/dL — ABNORMAL LOW (ref 12.0–15.0)
MCH: 27.6 pg (ref 26.0–34.0)
MCHC: 31.7 g/dL (ref 30.0–36.0)
MCV: 87 fL (ref 78.0–100.0)
PLATELETS: 474 10*3/uL — AB (ref 150–400)
RBC: 3.99 MIL/uL (ref 3.87–5.11)
RDW: 13.6 % (ref 11.5–15.5)
WBC: 8.2 10*3/uL (ref 4.0–10.5)

## 2014-10-24 DIAGNOSIS — S72009A Fracture of unspecified part of neck of unspecified femur, initial encounter for closed fracture: Secondary | ICD-10-CM | POA: Diagnosis not present

## 2014-10-24 DIAGNOSIS — I4891 Unspecified atrial fibrillation: Secondary | ICD-10-CM | POA: Diagnosis not present

## 2014-10-24 DIAGNOSIS — A047 Enterocolitis due to Clostridium difficile: Secondary | ICD-10-CM | POA: Diagnosis not present

## 2014-10-24 NOTE — H&P (Signed)
Tami [name] MRN: [id number] DOB/AGE: [birth year] 74 y.o. Primary Care Physician:Cadey Bazile L, MD Admit date: 10/11/2014 Chief Complaint: Fractured hip HPI: This is a 74 year old who was admitted to the hospital on April 5 with a fractured hip. She was also found to have atrial fibrillation with rapid ventricular response and C. difficile colitis. She was treated in the hospital and improved and eventually underwent hip repair. She was transferred to skilled care facility for rehabilitation. In addition to the above problems she has dementia.  Past Medical History  Diagnosis Date  . Arthritis   . Cancer Cervical  . Hypertension   . Stroke   . Anxiety    Past Surgical History  Procedure Laterality Date  . Knee arthroscopy Right   . Femur closed reduction Right   . Appendectomy    . Cholecystectomy    . Brain surgery    . Tonsillectomy    . Orif hip fracture Right 10/08/2014    Procedure: OPEN REDUCTION INTERNAL FIXATION RIGHT HIP;  Surgeon: Sanjuana Kava, MD;  Location: [facility];  Service: Orthopedics;  Laterality: Right;        Family History  Problem Relation Age of Onset  . Stroke Mother     Social History:  reports that she has quit smoking. She has never used smokeless tobacco. She reports that she does not drink alcohol or use illicit drugs.   Allergies:  Allergies  Allergen Reactions  . Penicillins Other (See Comments)    Child hood allergy(unknown reaction)    Medications Prior to Admission  Medication Sig Dispense Refill  . albuterol (PROVENTIL) (2.5 MG/3ML) 0.083% nebulizer solution Take 3 mLs (2.5 mg total) by nebulization every 4 (four) hours as needed for wheezing or shortness of breath. 75 mL 12  . apixaban (ELIQUIS) 5 MG TABS tablet Take 1 tablet (5 mg total) by mouth 2 (two) times daily. 60 tablet   . benazepril (LOTENSIN) 20 MG tablet Take 20 mg by mouth daily.    . clopidogrel (PLAVIX) 75 MG tablet Take 75 mg by mouth daily.    . digoxin (LANOXIN)  0.125 MG tablet Take 1 tablet (0.125 mg total) by mouth daily.    Marland Kitchen diltiazem (CARDIZEM CD) 120 MG 24 hr capsule Take 1 capsule (120 mg total) by mouth daily.    Marland Kitchen donepezil (ARICEPT) 10 MG tablet Take 20 mg by mouth at bedtime.    Marland Kitchen doxepin (SINEQUAN) 150 MG capsule Take 150 mg by mouth at bedtime.   11  . gabapentin (NEURONTIN) 400 MG capsule Take 400 mg by mouth 4 (four) times daily.    Marland Kitchen HYDROcodone-acetaminophen (NORCO) 10-325 MG per tablet Take 1 tablet by mouth 4 (four) times daily.    Marland Kitchen LORazepam (ATIVAN) 0.5 MG tablet Take 1 tablet (0.5 mg total) by mouth every 6 (six) hours as needed for anxiety. 30 tablet 0  . methocarbamol (ROBAXIN) 500 MG tablet Take 1 tablet (500 mg total) by mouth every 6 (six) hours as needed for muscle spasms.    . metoprolol (LOPRESSOR) 50 MG tablet Take 1 tablet (50 mg total) by mouth every 6 (six) hours.    . sertraline (ZOLOFT) 100 MG tablet Take 200 mg by mouth daily.     . simvastatin (ZOCOR) 40 MG tablet Take 40 mg by mouth at bedtime.     Marland Kitchen zolpidem (AMBIEN) 10 MG tablet Take 5 mg by mouth at bedtime.   5       HCW:CBJSE from the symptoms mentioned  above,there are no other symptoms referable to all systems reviewed.  Physical Exam: There were no vitals taken for this visit. She is awake and alert and confused. Her pupils are reactive nose and throat are clear mucous members are moist neck is supple without masses chest fairly clear. Heart is irregular without gallop. Her abdomen is soft. Extremities show signs of her previous surgery. Central nervous system exam shows she is confused    Recent Labs  10/22/14 2000  WBC 8.2  HGB 11.0*  HCT 34.7*  MCV 87.0  PLT 474*    Recent Labs  10/22/14 2000  NA 136  K 3.9  CL 99  CO2 33*  GLUCOSE 85  BUN 19  CREATININE 0.87  CALCIUM 8.4  lablast2(ast:2,ALT:2,alkphos:2,bilitot:2,prot:2,albumin:2)@    No results found for this or any previous visit (from the past 240 hour(s)).   Dg Chest 1  View  10/01/2014   CLINICAL DATA:  Status post fall. Concern for chest injury. Initial encounter.  EXAM: CHEST  1 VIEW  COMPARISON:  Chest radiograph from 08/09/2004, and CT of the chest performed 08/15/2004  FINDINGS: The lungs are well-aerated. Chronically increased interstitial markings are seen. Peribronchial thickening is noted. There is no evidence of pleural effusion or pneumothorax.  The cardiomediastinal silhouette is borderline enlarged. No acute osseous abnormalities are seen.  IMPRESSION: Chronically increased interstitial markings seen. Peribronchial thickening noted. Borderline cardiomegaly.   Electronically Signed   By: Garald Balding M.D.   On: 10/01/2014 00:38   Dg Hip Operative Unilat With Pelvis Right  10/08/2014   CLINICAL DATA:  C-arm right hip in the OR. Fluoroscopy time 38 seconds.  EXAM: OPERATIVE RIGHT HIP (WITH PELVIS IF PERFORMED) 4 VIEWS  TECHNIQUE: Fluoroscopic spot image(s) were submitted for interpretation post-operatively.  COMPARISON:  None.  FINDINGS: Images are submitted, showing lag screw and sideplate fixation of intertrochanteric fracture. No new fractures are identified. No evidence for dislocation on images provided.  IMPRESSION: ORIF right hip.   Electronically Signed   By: Nolon Nations M.D.   On: 10/08/2014 17:35   Dg Hip Unilat With Pelvis 2-3 Views Right  10/01/2014   CLINICAL DATA:  Acute onset of severe right hip pain, status post fall. Initial encounter.  EXAM: RIGHT HIP (WITH PELVIS) 2-3 VIEWS  COMPARISON:  None.  FINDINGS: There is a comminuted intertrochanteric fracture through the proximal right femur, with displaced comminuted lesser trochanteric fragments. There is mild lateral and distal displacement of the distal femur. The right femoral head remains seated at the acetabulum. The left hip joint is grossly unremarkable. The cross-table lateral views are markedly suboptimal due to the patient's habitus.  Mild degenerative change is noted at the lower  lumbar spine. The sacroiliac joints are grossly unremarkable in appearance. The visualized bowel gas pattern is grossly unremarkable.  IMPRESSION: Comminuted intertrochanteric fracture through the proximal right femur, with displaced comminuted lesser trochanteric fragments. Mild lateral and distal displacement of the distal femur.   Electronically Signed   By: Garald Balding M.D.   On: 10/01/2014 00:41   Impression: She had fractured hip. She is doing better with that. She has atrial fibrillation which is pretty well controlled now. She has dementia at baseline which is unchanged she had C. difficile colitis and that appears to be improving Active Problems:   * No active hospital problems. *     Plan: Continue current treatments      Carry Weesner L   10/24/2014, 10:44 AM

## 2014-11-03 DIAGNOSIS — R69 Illness, unspecified: Secondary | ICD-10-CM | POA: Diagnosis not present

## 2014-11-03 DIAGNOSIS — J449 Chronic obstructive pulmonary disease, unspecified: Secondary | ICD-10-CM | POA: Diagnosis not present

## 2014-11-03 DIAGNOSIS — S72301D Unspecified fracture of shaft of right femur, subsequent encounter for closed fracture with routine healing: Secondary | ICD-10-CM | POA: Diagnosis not present

## 2014-11-04 DIAGNOSIS — S72001D Fracture of unspecified part of neck of right femur, subsequent encounter for closed fracture with routine healing: Secondary | ICD-10-CM | POA: Diagnosis not present

## 2014-11-04 DIAGNOSIS — B948 Sequelae of other specified infectious and parasitic diseases: Secondary | ICD-10-CM | POA: Diagnosis not present

## 2014-11-04 DIAGNOSIS — L89511 Pressure ulcer of right ankle, stage 1: Secondary | ICD-10-CM | POA: Diagnosis not present

## 2014-11-04 DIAGNOSIS — L89621 Pressure ulcer of left heel, stage 1: Secondary | ICD-10-CM | POA: Diagnosis not present

## 2014-11-04 DIAGNOSIS — M25512 Pain in left shoulder: Secondary | ICD-10-CM | POA: Diagnosis not present

## 2014-11-04 DIAGNOSIS — I4891 Unspecified atrial fibrillation: Secondary | ICD-10-CM | POA: Diagnosis not present

## 2014-11-04 DIAGNOSIS — I10 Essential (primary) hypertension: Secondary | ICD-10-CM | POA: Diagnosis not present

## 2014-11-04 DIAGNOSIS — M25551 Pain in right hip: Secondary | ICD-10-CM | POA: Diagnosis not present

## 2014-11-04 DIAGNOSIS — W19XXXD Unspecified fall, subsequent encounter: Secondary | ICD-10-CM | POA: Diagnosis not present

## 2014-11-05 DIAGNOSIS — I10 Essential (primary) hypertension: Secondary | ICD-10-CM | POA: Diagnosis not present

## 2014-11-05 DIAGNOSIS — L89511 Pressure ulcer of right ankle, stage 1: Secondary | ICD-10-CM | POA: Diagnosis not present

## 2014-11-05 DIAGNOSIS — S72001D Fracture of unspecified part of neck of right femur, subsequent encounter for closed fracture with routine healing: Secondary | ICD-10-CM | POA: Diagnosis not present

## 2014-11-05 DIAGNOSIS — B948 Sequelae of other specified infectious and parasitic diseases: Secondary | ICD-10-CM | POA: Diagnosis not present

## 2014-11-05 DIAGNOSIS — M25551 Pain in right hip: Secondary | ICD-10-CM | POA: Diagnosis not present

## 2014-11-05 DIAGNOSIS — W19XXXD Unspecified fall, subsequent encounter: Secondary | ICD-10-CM | POA: Diagnosis not present

## 2014-11-05 DIAGNOSIS — I4891 Unspecified atrial fibrillation: Secondary | ICD-10-CM | POA: Diagnosis not present

## 2014-11-05 DIAGNOSIS — M25512 Pain in left shoulder: Secondary | ICD-10-CM | POA: Diagnosis not present

## 2014-11-05 DIAGNOSIS — L89621 Pressure ulcer of left heel, stage 1: Secondary | ICD-10-CM | POA: Diagnosis not present

## 2014-11-06 DIAGNOSIS — S72001D Fracture of unspecified part of neck of right femur, subsequent encounter for closed fracture with routine healing: Secondary | ICD-10-CM | POA: Diagnosis not present

## 2014-11-06 DIAGNOSIS — L89621 Pressure ulcer of left heel, stage 1: Secondary | ICD-10-CM | POA: Diagnosis not present

## 2014-11-06 DIAGNOSIS — L89511 Pressure ulcer of right ankle, stage 1: Secondary | ICD-10-CM | POA: Diagnosis not present

## 2014-11-06 DIAGNOSIS — M25512 Pain in left shoulder: Secondary | ICD-10-CM | POA: Diagnosis not present

## 2014-11-06 DIAGNOSIS — I10 Essential (primary) hypertension: Secondary | ICD-10-CM | POA: Diagnosis not present

## 2014-11-06 DIAGNOSIS — M25551 Pain in right hip: Secondary | ICD-10-CM | POA: Diagnosis not present

## 2014-11-06 DIAGNOSIS — I4891 Unspecified atrial fibrillation: Secondary | ICD-10-CM | POA: Diagnosis not present

## 2014-11-06 DIAGNOSIS — W19XXXD Unspecified fall, subsequent encounter: Secondary | ICD-10-CM | POA: Diagnosis not present

## 2014-11-06 DIAGNOSIS — B948 Sequelae of other specified infectious and parasitic diseases: Secondary | ICD-10-CM | POA: Diagnosis not present

## 2014-11-08 DIAGNOSIS — L89621 Pressure ulcer of left heel, stage 1: Secondary | ICD-10-CM | POA: Diagnosis not present

## 2014-11-08 DIAGNOSIS — I10 Essential (primary) hypertension: Secondary | ICD-10-CM | POA: Diagnosis not present

## 2014-11-08 DIAGNOSIS — S72001D Fracture of unspecified part of neck of right femur, subsequent encounter for closed fracture with routine healing: Secondary | ICD-10-CM | POA: Diagnosis not present

## 2014-11-08 DIAGNOSIS — W19XXXD Unspecified fall, subsequent encounter: Secondary | ICD-10-CM | POA: Diagnosis not present

## 2014-11-08 DIAGNOSIS — M25551 Pain in right hip: Secondary | ICD-10-CM | POA: Diagnosis not present

## 2014-11-08 DIAGNOSIS — B948 Sequelae of other specified infectious and parasitic diseases: Secondary | ICD-10-CM | POA: Diagnosis not present

## 2014-11-08 DIAGNOSIS — M25512 Pain in left shoulder: Secondary | ICD-10-CM | POA: Diagnosis not present

## 2014-11-08 DIAGNOSIS — I4891 Unspecified atrial fibrillation: Secondary | ICD-10-CM | POA: Diagnosis not present

## 2014-11-08 DIAGNOSIS — L89511 Pressure ulcer of right ankle, stage 1: Secondary | ICD-10-CM | POA: Diagnosis not present

## 2014-11-09 DIAGNOSIS — S72001D Fracture of unspecified part of neck of right femur, subsequent encounter for closed fracture with routine healing: Secondary | ICD-10-CM | POA: Diagnosis not present

## 2014-11-09 DIAGNOSIS — W19XXXD Unspecified fall, subsequent encounter: Secondary | ICD-10-CM | POA: Diagnosis not present

## 2014-11-09 DIAGNOSIS — B948 Sequelae of other specified infectious and parasitic diseases: Secondary | ICD-10-CM | POA: Diagnosis not present

## 2014-11-09 DIAGNOSIS — L89511 Pressure ulcer of right ankle, stage 1: Secondary | ICD-10-CM | POA: Diagnosis not present

## 2014-11-09 DIAGNOSIS — I10 Essential (primary) hypertension: Secondary | ICD-10-CM | POA: Diagnosis not present

## 2014-11-09 DIAGNOSIS — L89621 Pressure ulcer of left heel, stage 1: Secondary | ICD-10-CM | POA: Diagnosis not present

## 2014-11-09 DIAGNOSIS — M25512 Pain in left shoulder: Secondary | ICD-10-CM | POA: Diagnosis not present

## 2014-11-09 DIAGNOSIS — I4891 Unspecified atrial fibrillation: Secondary | ICD-10-CM | POA: Diagnosis not present

## 2014-11-09 DIAGNOSIS — M25551 Pain in right hip: Secondary | ICD-10-CM | POA: Diagnosis not present

## 2014-11-10 DIAGNOSIS — I4891 Unspecified atrial fibrillation: Secondary | ICD-10-CM | POA: Diagnosis not present

## 2014-11-10 DIAGNOSIS — S72001D Fracture of unspecified part of neck of right femur, subsequent encounter for closed fracture with routine healing: Secondary | ICD-10-CM | POA: Diagnosis not present

## 2014-11-10 DIAGNOSIS — L89511 Pressure ulcer of right ankle, stage 1: Secondary | ICD-10-CM | POA: Diagnosis not present

## 2014-11-10 DIAGNOSIS — B948 Sequelae of other specified infectious and parasitic diseases: Secondary | ICD-10-CM | POA: Diagnosis not present

## 2014-11-10 DIAGNOSIS — L89621 Pressure ulcer of left heel, stage 1: Secondary | ICD-10-CM | POA: Diagnosis not present

## 2014-11-10 DIAGNOSIS — M25559 Pain in unspecified hip: Secondary | ICD-10-CM | POA: Diagnosis not present

## 2014-11-10 DIAGNOSIS — M25551 Pain in right hip: Secondary | ICD-10-CM | POA: Diagnosis not present

## 2014-11-10 DIAGNOSIS — I10 Essential (primary) hypertension: Secondary | ICD-10-CM | POA: Diagnosis not present

## 2014-11-10 DIAGNOSIS — W19XXXD Unspecified fall, subsequent encounter: Secondary | ICD-10-CM | POA: Diagnosis not present

## 2014-11-10 DIAGNOSIS — M25512 Pain in left shoulder: Secondary | ICD-10-CM | POA: Diagnosis not present

## 2014-11-11 DIAGNOSIS — L89511 Pressure ulcer of right ankle, stage 1: Secondary | ICD-10-CM | POA: Diagnosis not present

## 2014-11-11 DIAGNOSIS — B948 Sequelae of other specified infectious and parasitic diseases: Secondary | ICD-10-CM | POA: Diagnosis not present

## 2014-11-11 DIAGNOSIS — M25512 Pain in left shoulder: Secondary | ICD-10-CM | POA: Diagnosis not present

## 2014-11-11 DIAGNOSIS — L89621 Pressure ulcer of left heel, stage 1: Secondary | ICD-10-CM | POA: Diagnosis not present

## 2014-11-11 DIAGNOSIS — S72001D Fracture of unspecified part of neck of right femur, subsequent encounter for closed fracture with routine healing: Secondary | ICD-10-CM | POA: Diagnosis not present

## 2014-11-11 DIAGNOSIS — I4891 Unspecified atrial fibrillation: Secondary | ICD-10-CM | POA: Diagnosis not present

## 2014-11-11 DIAGNOSIS — I10 Essential (primary) hypertension: Secondary | ICD-10-CM | POA: Diagnosis not present

## 2014-11-11 DIAGNOSIS — M25551 Pain in right hip: Secondary | ICD-10-CM | POA: Diagnosis not present

## 2014-11-11 DIAGNOSIS — W19XXXD Unspecified fall, subsequent encounter: Secondary | ICD-10-CM | POA: Diagnosis not present

## 2014-11-15 ENCOUNTER — Encounter (HOSPITAL_COMMUNITY): Payer: Self-pay | Admitting: Emergency Medicine

## 2014-11-15 ENCOUNTER — Emergency Department (HOSPITAL_COMMUNITY)
Admission: EM | Admit: 2014-11-15 | Discharge: 2014-11-15 | Disposition: A | Discharge status: Home / Self Care | Payer: Commercial Managed Care - HMO | Attending: Emergency Medicine | Admitting: Emergency Medicine

## 2014-11-15 ENCOUNTER — Emergency Department (HOSPITAL_COMMUNITY): Payer: Commercial Managed Care - HMO

## 2014-11-15 DIAGNOSIS — M47812 Spondylosis without myelopathy or radiculopathy, cervical region: Secondary | ICD-10-CM | POA: Diagnosis not present

## 2014-11-15 DIAGNOSIS — Z79899 Other long term (current) drug therapy: Secondary | ICD-10-CM | POA: Insufficient documentation

## 2014-11-15 DIAGNOSIS — Z7902 Long term (current) use of antithrombotics/antiplatelets: Secondary | ICD-10-CM | POA: Insufficient documentation

## 2014-11-15 DIAGNOSIS — I10 Essential (primary) hypertension: Secondary | ICD-10-CM | POA: Insufficient documentation

## 2014-11-15 DIAGNOSIS — F419 Anxiety disorder, unspecified: Secondary | ICD-10-CM | POA: Insufficient documentation

## 2014-11-15 DIAGNOSIS — B948 Sequelae of other specified infectious and parasitic diseases: Secondary | ICD-10-CM | POA: Diagnosis not present

## 2014-11-15 DIAGNOSIS — M436 Torticollis: Secondary | ICD-10-CM | POA: Diagnosis present

## 2014-11-15 DIAGNOSIS — M542 Cervicalgia: Secondary | ICD-10-CM | POA: Insufficient documentation

## 2014-11-15 DIAGNOSIS — Z7401 Bed confinement status: Secondary | ICD-10-CM | POA: Diagnosis not present

## 2014-11-15 DIAGNOSIS — F0390 Unspecified dementia without behavioral disturbance: Secondary | ICD-10-CM | POA: Insufficient documentation

## 2014-11-15 DIAGNOSIS — I4891 Unspecified atrial fibrillation: Secondary | ICD-10-CM | POA: Diagnosis not present

## 2014-11-15 DIAGNOSIS — L89621 Pressure ulcer of left heel, stage 1: Secondary | ICD-10-CM | POA: Diagnosis not present

## 2014-11-15 DIAGNOSIS — Z8673 Personal history of transient ischemic attack (TIA), and cerebral infarction without residual deficits: Secondary | ICD-10-CM | POA: Insufficient documentation

## 2014-11-15 DIAGNOSIS — Z859 Personal history of malignant neoplasm, unspecified: Secondary | ICD-10-CM | POA: Diagnosis not present

## 2014-11-15 DIAGNOSIS — L89511 Pressure ulcer of right ankle, stage 1: Secondary | ICD-10-CM | POA: Diagnosis not present

## 2014-11-15 DIAGNOSIS — Z87891 Personal history of nicotine dependence: Secondary | ICD-10-CM | POA: Diagnosis not present

## 2014-11-15 DIAGNOSIS — R0682 Tachypnea, not elsewhere classified: Secondary | ICD-10-CM | POA: Diagnosis not present

## 2014-11-15 DIAGNOSIS — M25512 Pain in left shoulder: Secondary | ICD-10-CM | POA: Diagnosis not present

## 2014-11-15 DIAGNOSIS — M1990 Unspecified osteoarthritis, unspecified site: Secondary | ICD-10-CM | POA: Diagnosis not present

## 2014-11-15 DIAGNOSIS — Z880 Allergy status to penicillin: Secondary | ICD-10-CM | POA: Insufficient documentation

## 2014-11-15 DIAGNOSIS — S72001D Fracture of unspecified part of neck of right femur, subsequent encounter for closed fracture with routine healing: Secondary | ICD-10-CM | POA: Diagnosis not present

## 2014-11-15 DIAGNOSIS — Z9981 Dependence on supplemental oxygen: Secondary | ICD-10-CM | POA: Diagnosis not present

## 2014-11-15 DIAGNOSIS — W19XXXD Unspecified fall, subsequent encounter: Secondary | ICD-10-CM | POA: Diagnosis not present

## 2014-11-15 DIAGNOSIS — M25551 Pain in right hip: Secondary | ICD-10-CM | POA: Diagnosis not present

## 2014-11-15 MED ORDER — OXYCODONE-ACETAMINOPHEN 5-325 MG PO TABS: 1.0000 | ORAL_TABLET | ORAL | Status: DC | PRN

## 2014-11-15 MED ORDER — IBUPROFEN 400 MG PO TABS
400.0000 mg | ORAL_TABLET | Freq: Once | ORAL | Status: AC
  Administered 2014-11-15: 400 mg via ORAL
  Filled 2014-11-15: qty 1

## 2014-11-15 MED ORDER — DIAZEPAM 5 MG PO TABS
2.5000 mg | ORAL_TABLET | Freq: Once | ORAL | Status: AC
  Administered 2014-11-15: 2.5 mg via ORAL
  Filled 2014-11-15: qty 1

## 2014-11-15 MED ORDER — OXYCODONE-ACETAMINOPHEN 5-325 MG PO TABS
1.0000 | ORAL_TABLET | Freq: Once | ORAL | Status: AC
  Administered 2014-11-15: 1 via ORAL
  Filled 2014-11-15: qty 1

## 2014-11-15 NOTE — ED Notes (Signed)
EMS called for d/c transport home per husband's request. Pt is can not walk or sit, has dementia and is on oxygen.

## 2014-11-15 NOTE — ED Notes (Signed)
Per EMS- home health nurse reports patient has been leaning head to right side since yesterday am. Pt c/o pain to right posterior neck. Denies injury. nad noted.

## 2014-11-15 NOTE — ED Provider Notes (Signed)
CSN: [id number]     Arrival date & time 11/15/14  1608 History   First MD Initiated Contact with Patient 11/15/14 1610     Chief Complaint  Patient presents with  . Torticollis     (Consider location/radiation/quality/duration/timing/severity/associated sxs/prior Treatment) HPI   73yF sent for evaluation because noted to have head hanging to R side. Onset apparently about a day ago. Pt reports R sided neck pain. She denies trauma. No report of trauma. Denies pain anywhere else. No fever or chills. Denies headache. No n/v.   Past Medical History  Diagnosis Date  . Arthritis   . Cancer Cervical  . Hypertension   . Stroke   . Anxiety    Past Surgical History  Procedure Laterality Date  . Knee arthroscopy Right   . Femur closed reduction Right   . Appendectomy    . Cholecystectomy    . Brain surgery    . Tonsillectomy    . Orif hip fracture Right 10/08/2014    Procedure: OPEN REDUCTION INTERNAL FIXATION RIGHT HIP;  Surgeon: Sanjuana Kava, MD;  Location: [facility];  Service: Orthopedics;  Laterality: Right;   Family History  Problem Relation Age of Onset  . Stroke Mother    History  Substance Use Topics  . Smoking status: Former Research scientist (life sciences)  . Smokeless tobacco: Never Used  . Alcohol Use: No   OB History    No data available     Review of Systems  All systems reviewed and negative, other than as noted in HPI.   Allergies  Penicillins  Home Medications   Prior to Admission medications   Medication Sig Start Date End Date Taking? Authorizing Provider  albuterol (PROVENTIL) (2.5 MG/3ML) 0.083% nebulizer solution Take 3 mLs (2.5 mg total) by nebulization every 4 (four) hours as needed for wheezing or shortness of breath. 10/11/14   Sinda Du, MD  apixaban (ELIQUIS) 5 MG TABS tablet Take 1 tablet (5 mg total) by mouth 2 (two) times daily. 10/11/14   Sinda Du, MD  benazepril (LOTENSIN) 20 MG tablet Take 20 mg by mouth daily.    Historical Provider, MD  clopidogrel  (PLAVIX) 75 MG tablet Take 75 mg by mouth daily.    Historical Provider, MD  digoxin (LANOXIN) 0.125 MG tablet Take 1 tablet (0.125 mg total) by mouth daily. 10/11/14   Sinda Du, MD  diltiazem (CARDIZEM CD) 120 MG 24 hr capsule Take 1 capsule (120 mg total) by mouth daily. 10/11/14   Sinda Du, MD  donepezil (ARICEPT) 10 MG tablet Take 20 mg by mouth at bedtime.    Historical Provider, MD  doxepin (SINEQUAN) 150 MG capsule Take 150 mg by mouth at bedtime.  09/02/14   Historical Provider, MD  gabapentin (NEURONTIN) 400 MG capsule Take 400 mg by mouth 4 (four) times daily.    Historical Provider, MD  HYDROcodone-acetaminophen (NORCO) 10-325 MG per tablet Take 1 tablet by mouth 4 (four) times daily.    Historical Provider, MD  LORazepam (ATIVAN) 0.5 MG tablet Take 1 tablet (0.5 mg total) by mouth every 6 (six) hours as needed for anxiety. 10/11/14   Sinda Du, MD  methocarbamol (ROBAXIN) 500 MG tablet Take 1 tablet (500 mg total) by mouth every 6 (six) hours as needed for muscle spasms. 10/11/14   Sinda Du, MD  metoprolol (LOPRESSOR) 50 MG tablet Take 1 tablet (50 mg total) by mouth every 6 (six) hours. 10/11/14   Sinda Du, MD  sertraline (ZOLOFT) 100 MG tablet Take 200  mg by mouth daily.     Historical Provider, MD  zolpidem (AMBIEN) 10 MG tablet Take 5 mg by mouth at bedtime.  09/21/14   Historical Provider, MD   BP 138/83 mmHg  Pulse 60  Temp(Src) 99 F (37.2 C)  Resp 18  Ht 5\' 9"  (1.753 m)  Wt 180 lb (81.647 kg)  BMI 26.57 kg/m2  SpO2 99% Physical Exam  Constitutional: She appears well-developed and well-nourished. No distress.  HENT:  Head: Normocephalic and atraumatic.  Sitting with head tilted and slightly rotated towards R. TTP L lateral neck. No concerning skin changes noted. No midline spinal tenderness. Increased pain with ROM, particularly rotation of head towards her left.   Eyes: Conjunctivae are normal. Right eye exhibits no discharge. Left eye exhibits no  discharge.  Neck: Neck supple.  Cardiovascular: Normal rate, regular rhythm and normal heart sounds.  Exam reveals no gallop and no friction rub.   No murmur heard. Pulmonary/Chest: Effort normal and breath sounds normal. No respiratory distress.  Abdominal: Soft. She exhibits no distension. There is no tenderness.  Musculoskeletal: She exhibits no edema or tenderness.  Neurological: She is alert. No cranial nerve deficit.  Strength 5/5 b/l upper extremities.   Skin: Skin is warm and dry.  Nursing note and vitals reviewed.   ED Course  Procedures (including critical care time) Labs Review Labs Reviewed - No data to display  Imaging Review Ct Cervical Spine Wo Contrast  11/15/2014   CLINICAL DATA:  Right posterior neck pain, no known injury  EXAM: CT CERVICAL SPINE WITHOUT CONTRAST  TECHNIQUE: Multidetector CT imaging of the cervical spine was performed without intravenous contrast. Multiplanar CT image reconstructions were also generated.  COMPARISON:  MRI cervical spine dated 08/15/2007  FINDINGS: Motion degraded images.  Normal cervical lordosis.  No evidence of fracture or dislocation. Vertebral body heights are maintained. Dens appears intact.  No prevertebral soft tissue swelling.  Mild to moderate degenerative changes, most prominent at C4-5 and C5-6.  Visualized thyroid is unremarkable.  Visualized lung apices are notable for biapical pleural parenchymal scarring.  IMPRESSION: No fracture or dislocation is seen.  Mild to moderate degenerative changes.   Electronically Signed   By: Julian Hy M.D.   On: 11/15/2014 18:17     EKG Interpretation None      MDM   Final diagnoses:  Neck pain    73yF with neck pain. Denies trauma and no reported trauma. I do not feel she is a completely reliable historian though. Her tenderness seems more lateral than midline. CT w/o acute abnormality. Possibly torticollis. Plan symptomatic tx at this time.      Virgel Manifold, MD 11/15/14  [phone]

## 2014-11-16 DIAGNOSIS — B948 Sequelae of other specified infectious and parasitic diseases: Secondary | ICD-10-CM | POA: Diagnosis not present

## 2014-11-16 DIAGNOSIS — S72001D Fracture of unspecified part of neck of right femur, subsequent encounter for closed fracture with routine healing: Secondary | ICD-10-CM | POA: Diagnosis not present

## 2014-11-16 DIAGNOSIS — L89621 Pressure ulcer of left heel, stage 1: Secondary | ICD-10-CM | POA: Diagnosis not present

## 2014-11-16 DIAGNOSIS — W19XXXD Unspecified fall, subsequent encounter: Secondary | ICD-10-CM | POA: Diagnosis not present

## 2014-11-16 DIAGNOSIS — I10 Essential (primary) hypertension: Secondary | ICD-10-CM | POA: Diagnosis not present

## 2014-11-16 DIAGNOSIS — I4891 Unspecified atrial fibrillation: Secondary | ICD-10-CM | POA: Diagnosis not present

## 2014-11-16 DIAGNOSIS — M25551 Pain in right hip: Secondary | ICD-10-CM | POA: Diagnosis not present

## 2014-11-16 DIAGNOSIS — L89511 Pressure ulcer of right ankle, stage 1: Secondary | ICD-10-CM | POA: Diagnosis not present

## 2014-11-16 DIAGNOSIS — M25512 Pain in left shoulder: Secondary | ICD-10-CM | POA: Diagnosis not present

## 2014-11-17 DIAGNOSIS — M25512 Pain in left shoulder: Secondary | ICD-10-CM | POA: Diagnosis not present

## 2014-11-17 DIAGNOSIS — L89621 Pressure ulcer of left heel, stage 1: Secondary | ICD-10-CM | POA: Diagnosis not present

## 2014-11-17 DIAGNOSIS — M25551 Pain in right hip: Secondary | ICD-10-CM | POA: Diagnosis not present

## 2014-11-17 DIAGNOSIS — I4891 Unspecified atrial fibrillation: Secondary | ICD-10-CM | POA: Diagnosis not present

## 2014-11-17 DIAGNOSIS — L89511 Pressure ulcer of right ankle, stage 1: Secondary | ICD-10-CM | POA: Diagnosis not present

## 2014-11-17 DIAGNOSIS — I10 Essential (primary) hypertension: Secondary | ICD-10-CM | POA: Diagnosis not present

## 2014-11-17 DIAGNOSIS — B948 Sequelae of other specified infectious and parasitic diseases: Secondary | ICD-10-CM | POA: Diagnosis not present

## 2014-11-17 DIAGNOSIS — S72001D Fracture of unspecified part of neck of right femur, subsequent encounter for closed fracture with routine healing: Secondary | ICD-10-CM | POA: Diagnosis not present

## 2014-11-17 DIAGNOSIS — W19XXXD Unspecified fall, subsequent encounter: Secondary | ICD-10-CM | POA: Diagnosis not present

## 2014-11-19 DIAGNOSIS — M25512 Pain in left shoulder: Secondary | ICD-10-CM | POA: Diagnosis not present

## 2014-11-19 DIAGNOSIS — S72001D Fracture of unspecified part of neck of right femur, subsequent encounter for closed fracture with routine healing: Secondary | ICD-10-CM | POA: Diagnosis not present

## 2014-11-19 DIAGNOSIS — L89621 Pressure ulcer of left heel, stage 1: Secondary | ICD-10-CM | POA: Diagnosis not present

## 2014-11-19 DIAGNOSIS — M25551 Pain in right hip: Secondary | ICD-10-CM | POA: Diagnosis not present

## 2014-11-19 DIAGNOSIS — I10 Essential (primary) hypertension: Secondary | ICD-10-CM | POA: Diagnosis not present

## 2014-11-19 DIAGNOSIS — W19XXXD Unspecified fall, subsequent encounter: Secondary | ICD-10-CM | POA: Diagnosis not present

## 2014-11-19 DIAGNOSIS — I4891 Unspecified atrial fibrillation: Secondary | ICD-10-CM | POA: Diagnosis not present

## 2014-11-19 DIAGNOSIS — L89511 Pressure ulcer of right ankle, stage 1: Secondary | ICD-10-CM | POA: Diagnosis not present

## 2014-11-19 DIAGNOSIS — B948 Sequelae of other specified infectious and parasitic diseases: Secondary | ICD-10-CM | POA: Diagnosis not present

## 2014-11-23 DIAGNOSIS — S72001D Fracture of unspecified part of neck of right femur, subsequent encounter for closed fracture with routine healing: Secondary | ICD-10-CM | POA: Diagnosis not present

## 2014-11-23 DIAGNOSIS — B948 Sequelae of other specified infectious and parasitic diseases: Secondary | ICD-10-CM | POA: Diagnosis not present

## 2014-11-23 DIAGNOSIS — L89621 Pressure ulcer of left heel, stage 1: Secondary | ICD-10-CM | POA: Diagnosis not present

## 2014-11-23 DIAGNOSIS — M25551 Pain in right hip: Secondary | ICD-10-CM | POA: Diagnosis not present

## 2014-11-23 DIAGNOSIS — I10 Essential (primary) hypertension: Secondary | ICD-10-CM | POA: Diagnosis not present

## 2014-11-23 DIAGNOSIS — I4891 Unspecified atrial fibrillation: Secondary | ICD-10-CM | POA: Diagnosis not present

## 2014-11-23 DIAGNOSIS — M25512 Pain in left shoulder: Secondary | ICD-10-CM | POA: Diagnosis not present

## 2014-11-23 DIAGNOSIS — L89511 Pressure ulcer of right ankle, stage 1: Secondary | ICD-10-CM | POA: Diagnosis not present

## 2014-11-23 DIAGNOSIS — W19XXXD Unspecified fall, subsequent encounter: Secondary | ICD-10-CM | POA: Diagnosis not present

## 2014-11-26 DIAGNOSIS — L89621 Pressure ulcer of left heel, stage 1: Secondary | ICD-10-CM | POA: Diagnosis not present

## 2014-11-26 DIAGNOSIS — M25551 Pain in right hip: Secondary | ICD-10-CM | POA: Diagnosis not present

## 2014-11-26 DIAGNOSIS — S72001D Fracture of unspecified part of neck of right femur, subsequent encounter for closed fracture with routine healing: Secondary | ICD-10-CM | POA: Diagnosis not present

## 2014-11-26 DIAGNOSIS — I10 Essential (primary) hypertension: Secondary | ICD-10-CM | POA: Diagnosis not present

## 2014-11-26 DIAGNOSIS — M25512 Pain in left shoulder: Secondary | ICD-10-CM | POA: Diagnosis not present

## 2014-11-26 DIAGNOSIS — W19XXXD Unspecified fall, subsequent encounter: Secondary | ICD-10-CM | POA: Diagnosis not present

## 2014-11-26 DIAGNOSIS — L89511 Pressure ulcer of right ankle, stage 1: Secondary | ICD-10-CM | POA: Diagnosis not present

## 2014-11-26 DIAGNOSIS — I4891 Unspecified atrial fibrillation: Secondary | ICD-10-CM | POA: Diagnosis not present

## 2014-11-26 DIAGNOSIS — B948 Sequelae of other specified infectious and parasitic diseases: Secondary | ICD-10-CM | POA: Diagnosis not present

## 2014-11-29 DIAGNOSIS — S72001D Fracture of unspecified part of neck of right femur, subsequent encounter for closed fracture with routine healing: Secondary | ICD-10-CM | POA: Diagnosis not present

## 2014-11-29 DIAGNOSIS — L89621 Pressure ulcer of left heel, stage 1: Secondary | ICD-10-CM | POA: Diagnosis not present

## 2014-11-29 DIAGNOSIS — M25551 Pain in right hip: Secondary | ICD-10-CM | POA: Diagnosis not present

## 2014-11-29 DIAGNOSIS — I10 Essential (primary) hypertension: Secondary | ICD-10-CM | POA: Diagnosis not present

## 2014-11-29 DIAGNOSIS — L89511 Pressure ulcer of right ankle, stage 1: Secondary | ICD-10-CM | POA: Diagnosis not present

## 2014-11-29 DIAGNOSIS — I4891 Unspecified atrial fibrillation: Secondary | ICD-10-CM | POA: Diagnosis not present

## 2014-11-29 DIAGNOSIS — W19XXXD Unspecified fall, subsequent encounter: Secondary | ICD-10-CM | POA: Diagnosis not present

## 2014-11-29 DIAGNOSIS — B948 Sequelae of other specified infectious and parasitic diseases: Secondary | ICD-10-CM | POA: Diagnosis not present

## 2014-11-29 DIAGNOSIS — M25512 Pain in left shoulder: Secondary | ICD-10-CM | POA: Diagnosis not present

## 2014-11-30 DIAGNOSIS — C539 Malignant neoplasm of cervix uteri, unspecified: Secondary | ICD-10-CM | POA: Diagnosis not present

## 2014-11-30 DIAGNOSIS — I10 Essential (primary) hypertension: Secondary | ICD-10-CM | POA: Diagnosis not present

## 2014-11-30 DIAGNOSIS — J449 Chronic obstructive pulmonary disease, unspecified: Secondary | ICD-10-CM | POA: Diagnosis not present

## 2014-11-30 DIAGNOSIS — I4891 Unspecified atrial fibrillation: Secondary | ICD-10-CM | POA: Diagnosis not present

## 2014-12-01 DIAGNOSIS — W19XXXD Unspecified fall, subsequent encounter: Secondary | ICD-10-CM | POA: Diagnosis not present

## 2014-12-01 DIAGNOSIS — I4891 Unspecified atrial fibrillation: Secondary | ICD-10-CM | POA: Diagnosis not present

## 2014-12-01 DIAGNOSIS — L89621 Pressure ulcer of left heel, stage 1: Secondary | ICD-10-CM | POA: Diagnosis not present

## 2014-12-01 DIAGNOSIS — M25512 Pain in left shoulder: Secondary | ICD-10-CM | POA: Diagnosis not present

## 2014-12-01 DIAGNOSIS — B948 Sequelae of other specified infectious and parasitic diseases: Secondary | ICD-10-CM | POA: Diagnosis not present

## 2014-12-01 DIAGNOSIS — I10 Essential (primary) hypertension: Secondary | ICD-10-CM | POA: Diagnosis not present

## 2014-12-01 DIAGNOSIS — M25551 Pain in right hip: Secondary | ICD-10-CM | POA: Diagnosis not present

## 2014-12-01 DIAGNOSIS — L89511 Pressure ulcer of right ankle, stage 1: Secondary | ICD-10-CM | POA: Diagnosis not present

## 2014-12-01 DIAGNOSIS — S72001D Fracture of unspecified part of neck of right femur, subsequent encounter for closed fracture with routine healing: Secondary | ICD-10-CM | POA: Diagnosis not present

## 2014-12-03 DIAGNOSIS — I10 Essential (primary) hypertension: Secondary | ICD-10-CM | POA: Diagnosis not present

## 2014-12-03 DIAGNOSIS — M25551 Pain in right hip: Secondary | ICD-10-CM | POA: Diagnosis not present

## 2014-12-03 DIAGNOSIS — W19XXXD Unspecified fall, subsequent encounter: Secondary | ICD-10-CM | POA: Diagnosis not present

## 2014-12-03 DIAGNOSIS — I4891 Unspecified atrial fibrillation: Secondary | ICD-10-CM | POA: Diagnosis not present

## 2014-12-03 DIAGNOSIS — L89511 Pressure ulcer of right ankle, stage 1: Secondary | ICD-10-CM | POA: Diagnosis not present

## 2014-12-03 DIAGNOSIS — S72001D Fracture of unspecified part of neck of right femur, subsequent encounter for closed fracture with routine healing: Secondary | ICD-10-CM | POA: Diagnosis not present

## 2014-12-03 DIAGNOSIS — B948 Sequelae of other specified infectious and parasitic diseases: Secondary | ICD-10-CM | POA: Diagnosis not present

## 2014-12-03 DIAGNOSIS — M25512 Pain in left shoulder: Secondary | ICD-10-CM | POA: Diagnosis not present

## 2014-12-03 DIAGNOSIS — L89621 Pressure ulcer of left heel, stage 1: Secondary | ICD-10-CM | POA: Diagnosis not present

## 2014-12-04 DIAGNOSIS — S72301D Unspecified fracture of shaft of right femur, subsequent encounter for closed fracture with routine healing: Secondary | ICD-10-CM | POA: Diagnosis not present

## 2014-12-04 DIAGNOSIS — J449 Chronic obstructive pulmonary disease, unspecified: Secondary | ICD-10-CM | POA: Diagnosis not present

## 2014-12-07 DIAGNOSIS — M25512 Pain in left shoulder: Secondary | ICD-10-CM | POA: Diagnosis not present

## 2014-12-07 DIAGNOSIS — B948 Sequelae of other specified infectious and parasitic diseases: Secondary | ICD-10-CM | POA: Diagnosis not present

## 2014-12-07 DIAGNOSIS — M25551 Pain in right hip: Secondary | ICD-10-CM | POA: Diagnosis not present

## 2014-12-07 DIAGNOSIS — L89511 Pressure ulcer of right ankle, stage 1: Secondary | ICD-10-CM | POA: Diagnosis not present

## 2014-12-07 DIAGNOSIS — I4891 Unspecified atrial fibrillation: Secondary | ICD-10-CM | POA: Diagnosis not present

## 2014-12-07 DIAGNOSIS — I10 Essential (primary) hypertension: Secondary | ICD-10-CM | POA: Diagnosis not present

## 2014-12-07 DIAGNOSIS — S72001D Fracture of unspecified part of neck of right femur, subsequent encounter for closed fracture with routine healing: Secondary | ICD-10-CM | POA: Diagnosis not present

## 2014-12-07 DIAGNOSIS — W19XXXD Unspecified fall, subsequent encounter: Secondary | ICD-10-CM | POA: Diagnosis not present

## 2014-12-07 DIAGNOSIS — L89621 Pressure ulcer of left heel, stage 1: Secondary | ICD-10-CM | POA: Diagnosis not present

## 2014-12-09 DIAGNOSIS — B948 Sequelae of other specified infectious and parasitic diseases: Secondary | ICD-10-CM | POA: Diagnosis not present

## 2014-12-09 DIAGNOSIS — I10 Essential (primary) hypertension: Secondary | ICD-10-CM | POA: Diagnosis not present

## 2014-12-09 DIAGNOSIS — S72001D Fracture of unspecified part of neck of right femur, subsequent encounter for closed fracture with routine healing: Secondary | ICD-10-CM | POA: Diagnosis not present

## 2014-12-09 DIAGNOSIS — L89621 Pressure ulcer of left heel, stage 1: Secondary | ICD-10-CM | POA: Diagnosis not present

## 2014-12-09 DIAGNOSIS — L89511 Pressure ulcer of right ankle, stage 1: Secondary | ICD-10-CM | POA: Diagnosis not present

## 2014-12-09 DIAGNOSIS — M25512 Pain in left shoulder: Secondary | ICD-10-CM | POA: Diagnosis not present

## 2014-12-09 DIAGNOSIS — I4891 Unspecified atrial fibrillation: Secondary | ICD-10-CM | POA: Diagnosis not present

## 2014-12-09 DIAGNOSIS — M25551 Pain in right hip: Secondary | ICD-10-CM | POA: Diagnosis not present

## 2014-12-09 DIAGNOSIS — W19XXXD Unspecified fall, subsequent encounter: Secondary | ICD-10-CM | POA: Diagnosis not present

## 2014-12-10 DIAGNOSIS — M25512 Pain in left shoulder: Secondary | ICD-10-CM | POA: Diagnosis not present

## 2014-12-10 DIAGNOSIS — L89621 Pressure ulcer of left heel, stage 1: Secondary | ICD-10-CM | POA: Diagnosis not present

## 2014-12-10 DIAGNOSIS — I4891 Unspecified atrial fibrillation: Secondary | ICD-10-CM | POA: Diagnosis not present

## 2014-12-10 DIAGNOSIS — L89511 Pressure ulcer of right ankle, stage 1: Secondary | ICD-10-CM | POA: Diagnosis not present

## 2014-12-10 DIAGNOSIS — I10 Essential (primary) hypertension: Secondary | ICD-10-CM | POA: Diagnosis not present

## 2014-12-10 DIAGNOSIS — W19XXXD Unspecified fall, subsequent encounter: Secondary | ICD-10-CM | POA: Diagnosis not present

## 2014-12-10 DIAGNOSIS — M25551 Pain in right hip: Secondary | ICD-10-CM | POA: Diagnosis not present

## 2014-12-10 DIAGNOSIS — B948 Sequelae of other specified infectious and parasitic diseases: Secondary | ICD-10-CM | POA: Diagnosis not present

## 2014-12-10 DIAGNOSIS — S72001D Fracture of unspecified part of neck of right femur, subsequent encounter for closed fracture with routine healing: Secondary | ICD-10-CM | POA: Diagnosis not present

## 2014-12-14 DIAGNOSIS — M25512 Pain in left shoulder: Secondary | ICD-10-CM | POA: Diagnosis not present

## 2014-12-14 DIAGNOSIS — W19XXXD Unspecified fall, subsequent encounter: Secondary | ICD-10-CM | POA: Diagnosis not present

## 2014-12-14 DIAGNOSIS — I10 Essential (primary) hypertension: Secondary | ICD-10-CM | POA: Diagnosis not present

## 2014-12-14 DIAGNOSIS — S72001D Fracture of unspecified part of neck of right femur, subsequent encounter for closed fracture with routine healing: Secondary | ICD-10-CM | POA: Diagnosis not present

## 2014-12-14 DIAGNOSIS — B948 Sequelae of other specified infectious and parasitic diseases: Secondary | ICD-10-CM | POA: Diagnosis not present

## 2014-12-14 DIAGNOSIS — I4891 Unspecified atrial fibrillation: Secondary | ICD-10-CM | POA: Diagnosis not present

## 2014-12-14 DIAGNOSIS — L89621 Pressure ulcer of left heel, stage 1: Secondary | ICD-10-CM | POA: Diagnosis not present

## 2014-12-14 DIAGNOSIS — M25551 Pain in right hip: Secondary | ICD-10-CM | POA: Diagnosis not present

## 2014-12-14 DIAGNOSIS — L89511 Pressure ulcer of right ankle, stage 1: Secondary | ICD-10-CM | POA: Diagnosis not present

## 2014-12-17 DIAGNOSIS — B948 Sequelae of other specified infectious and parasitic diseases: Secondary | ICD-10-CM | POA: Diagnosis not present

## 2014-12-17 DIAGNOSIS — M25512 Pain in left shoulder: Secondary | ICD-10-CM | POA: Diagnosis not present

## 2014-12-17 DIAGNOSIS — L89621 Pressure ulcer of left heel, stage 1: Secondary | ICD-10-CM | POA: Diagnosis not present

## 2014-12-17 DIAGNOSIS — I10 Essential (primary) hypertension: Secondary | ICD-10-CM | POA: Diagnosis not present

## 2014-12-17 DIAGNOSIS — I4891 Unspecified atrial fibrillation: Secondary | ICD-10-CM | POA: Diagnosis not present

## 2014-12-17 DIAGNOSIS — S72001D Fracture of unspecified part of neck of right femur, subsequent encounter for closed fracture with routine healing: Secondary | ICD-10-CM | POA: Diagnosis not present

## 2014-12-17 DIAGNOSIS — W19XXXD Unspecified fall, subsequent encounter: Secondary | ICD-10-CM | POA: Diagnosis not present

## 2014-12-17 DIAGNOSIS — L89511 Pressure ulcer of right ankle, stage 1: Secondary | ICD-10-CM | POA: Diagnosis not present

## 2014-12-17 DIAGNOSIS — M25551 Pain in right hip: Secondary | ICD-10-CM | POA: Diagnosis not present

## 2014-12-18 DIAGNOSIS — L89621 Pressure ulcer of left heel, stage 1: Secondary | ICD-10-CM | POA: Diagnosis not present

## 2014-12-18 DIAGNOSIS — S72001D Fracture of unspecified part of neck of right femur, subsequent encounter for closed fracture with routine healing: Secondary | ICD-10-CM | POA: Diagnosis not present

## 2014-12-18 DIAGNOSIS — B948 Sequelae of other specified infectious and parasitic diseases: Secondary | ICD-10-CM | POA: Diagnosis not present

## 2014-12-18 DIAGNOSIS — I4891 Unspecified atrial fibrillation: Secondary | ICD-10-CM | POA: Diagnosis not present

## 2014-12-18 DIAGNOSIS — M25512 Pain in left shoulder: Secondary | ICD-10-CM | POA: Diagnosis not present

## 2014-12-18 DIAGNOSIS — M25551 Pain in right hip: Secondary | ICD-10-CM | POA: Diagnosis not present

## 2014-12-18 DIAGNOSIS — W19XXXD Unspecified fall, subsequent encounter: Secondary | ICD-10-CM | POA: Diagnosis not present

## 2014-12-18 DIAGNOSIS — I10 Essential (primary) hypertension: Secondary | ICD-10-CM | POA: Diagnosis not present

## 2014-12-18 DIAGNOSIS — L89511 Pressure ulcer of right ankle, stage 1: Secondary | ICD-10-CM | POA: Diagnosis not present

## 2014-12-21 DIAGNOSIS — M25551 Pain in right hip: Secondary | ICD-10-CM | POA: Diagnosis not present

## 2014-12-21 DIAGNOSIS — W19XXXD Unspecified fall, subsequent encounter: Secondary | ICD-10-CM | POA: Diagnosis not present

## 2014-12-21 DIAGNOSIS — B948 Sequelae of other specified infectious and parasitic diseases: Secondary | ICD-10-CM | POA: Diagnosis not present

## 2014-12-21 DIAGNOSIS — I10 Essential (primary) hypertension: Secondary | ICD-10-CM | POA: Diagnosis not present

## 2014-12-21 DIAGNOSIS — M25512 Pain in left shoulder: Secondary | ICD-10-CM | POA: Diagnosis not present

## 2014-12-21 DIAGNOSIS — L89621 Pressure ulcer of left heel, stage 1: Secondary | ICD-10-CM | POA: Diagnosis not present

## 2014-12-21 DIAGNOSIS — I4891 Unspecified atrial fibrillation: Secondary | ICD-10-CM | POA: Diagnosis not present

## 2014-12-21 DIAGNOSIS — L89511 Pressure ulcer of right ankle, stage 1: Secondary | ICD-10-CM | POA: Diagnosis not present

## 2014-12-21 DIAGNOSIS — S72001D Fracture of unspecified part of neck of right femur, subsequent encounter for closed fracture with routine healing: Secondary | ICD-10-CM | POA: Diagnosis not present

## 2014-12-23 DIAGNOSIS — B948 Sequelae of other specified infectious and parasitic diseases: Secondary | ICD-10-CM | POA: Diagnosis not present

## 2014-12-23 DIAGNOSIS — M25551 Pain in right hip: Secondary | ICD-10-CM | POA: Diagnosis not present

## 2014-12-23 DIAGNOSIS — L89511 Pressure ulcer of right ankle, stage 1: Secondary | ICD-10-CM | POA: Diagnosis not present

## 2014-12-23 DIAGNOSIS — M25512 Pain in left shoulder: Secondary | ICD-10-CM | POA: Diagnosis not present

## 2014-12-23 DIAGNOSIS — I10 Essential (primary) hypertension: Secondary | ICD-10-CM | POA: Diagnosis not present

## 2014-12-23 DIAGNOSIS — L89621 Pressure ulcer of left heel, stage 1: Secondary | ICD-10-CM | POA: Diagnosis not present

## 2014-12-23 DIAGNOSIS — W19XXXD Unspecified fall, subsequent encounter: Secondary | ICD-10-CM | POA: Diagnosis not present

## 2014-12-23 DIAGNOSIS — I4891 Unspecified atrial fibrillation: Secondary | ICD-10-CM | POA: Diagnosis not present

## 2014-12-23 DIAGNOSIS — S72001D Fracture of unspecified part of neck of right femur, subsequent encounter for closed fracture with routine healing: Secondary | ICD-10-CM | POA: Diagnosis not present

## 2014-12-24 DIAGNOSIS — S72001D Fracture of unspecified part of neck of right femur, subsequent encounter for closed fracture with routine healing: Secondary | ICD-10-CM | POA: Diagnosis not present

## 2014-12-24 DIAGNOSIS — W19XXXD Unspecified fall, subsequent encounter: Secondary | ICD-10-CM | POA: Diagnosis not present

## 2014-12-24 DIAGNOSIS — L89511 Pressure ulcer of right ankle, stage 1: Secondary | ICD-10-CM | POA: Diagnosis not present

## 2014-12-24 DIAGNOSIS — I4891 Unspecified atrial fibrillation: Secondary | ICD-10-CM | POA: Diagnosis not present

## 2014-12-24 DIAGNOSIS — M25551 Pain in right hip: Secondary | ICD-10-CM | POA: Diagnosis not present

## 2014-12-24 DIAGNOSIS — I10 Essential (primary) hypertension: Secondary | ICD-10-CM | POA: Diagnosis not present

## 2014-12-24 DIAGNOSIS — M25512 Pain in left shoulder: Secondary | ICD-10-CM | POA: Diagnosis not present

## 2014-12-24 DIAGNOSIS — B948 Sequelae of other specified infectious and parasitic diseases: Secondary | ICD-10-CM | POA: Diagnosis not present

## 2014-12-24 DIAGNOSIS — L89621 Pressure ulcer of left heel, stage 1: Secondary | ICD-10-CM | POA: Diagnosis not present

## 2014-12-30 DIAGNOSIS — I10 Essential (primary) hypertension: Secondary | ICD-10-CM | POA: Diagnosis not present

## 2014-12-30 DIAGNOSIS — C539 Malignant neoplasm of cervix uteri, unspecified: Secondary | ICD-10-CM | POA: Diagnosis not present

## 2014-12-30 DIAGNOSIS — J449 Chronic obstructive pulmonary disease, unspecified: Secondary | ICD-10-CM | POA: Diagnosis not present

## 2014-12-30 DIAGNOSIS — I4891 Unspecified atrial fibrillation: Secondary | ICD-10-CM | POA: Diagnosis not present

## 2014-12-31 DIAGNOSIS — S72001D Fracture of unspecified part of neck of right femur, subsequent encounter for closed fracture with routine healing: Secondary | ICD-10-CM | POA: Diagnosis not present

## 2014-12-31 DIAGNOSIS — L89621 Pressure ulcer of left heel, stage 1: Secondary | ICD-10-CM | POA: Diagnosis not present

## 2014-12-31 DIAGNOSIS — L89511 Pressure ulcer of right ankle, stage 1: Secondary | ICD-10-CM | POA: Diagnosis not present

## 2014-12-31 DIAGNOSIS — W19XXXD Unspecified fall, subsequent encounter: Secondary | ICD-10-CM | POA: Diagnosis not present

## 2014-12-31 DIAGNOSIS — M25551 Pain in right hip: Secondary | ICD-10-CM | POA: Diagnosis not present

## 2014-12-31 DIAGNOSIS — I4891 Unspecified atrial fibrillation: Secondary | ICD-10-CM | POA: Diagnosis not present

## 2014-12-31 DIAGNOSIS — I10 Essential (primary) hypertension: Secondary | ICD-10-CM | POA: Diagnosis not present

## 2014-12-31 DIAGNOSIS — M25512 Pain in left shoulder: Secondary | ICD-10-CM | POA: Diagnosis not present

## 2014-12-31 DIAGNOSIS — B948 Sequelae of other specified infectious and parasitic diseases: Secondary | ICD-10-CM | POA: Diagnosis not present

## 2015-01-30 DIAGNOSIS — C539 Malignant neoplasm of cervix uteri, unspecified: Secondary | ICD-10-CM | POA: Diagnosis not present

## 2015-01-30 DIAGNOSIS — J449 Chronic obstructive pulmonary disease, unspecified: Secondary | ICD-10-CM | POA: Diagnosis not present

## 2015-01-30 DIAGNOSIS — I4891 Unspecified atrial fibrillation: Secondary | ICD-10-CM | POA: Diagnosis not present

## 2015-01-30 DIAGNOSIS — I10 Essential (primary) hypertension: Secondary | ICD-10-CM | POA: Diagnosis not present

## 2015-03-01 DIAGNOSIS — J449 Chronic obstructive pulmonary disease, unspecified: Secondary | ICD-10-CM | POA: Diagnosis not present

## 2015-03-01 DIAGNOSIS — C539 Malignant neoplasm of cervix uteri, unspecified: Secondary | ICD-10-CM | POA: Diagnosis not present

## 2015-03-01 DIAGNOSIS — I10 Essential (primary) hypertension: Secondary | ICD-10-CM | POA: Diagnosis not present

## 2015-03-01 DIAGNOSIS — I4891 Unspecified atrial fibrillation: Secondary | ICD-10-CM | POA: Diagnosis not present

## 2015-04-01 DIAGNOSIS — I10 Essential (primary) hypertension: Secondary | ICD-10-CM | POA: Diagnosis not present

## 2015-04-01 DIAGNOSIS — J449 Chronic obstructive pulmonary disease, unspecified: Secondary | ICD-10-CM | POA: Diagnosis not present

## 2015-04-01 DIAGNOSIS — C539 Malignant neoplasm of cervix uteri, unspecified: Secondary | ICD-10-CM | POA: Diagnosis not present

## 2015-04-01 DIAGNOSIS — I4891 Unspecified atrial fibrillation: Secondary | ICD-10-CM | POA: Diagnosis not present

## 2015-05-02 DIAGNOSIS — C539 Malignant neoplasm of cervix uteri, unspecified: Secondary | ICD-10-CM | POA: Diagnosis not present

## 2015-05-02 DIAGNOSIS — I4891 Unspecified atrial fibrillation: Secondary | ICD-10-CM | POA: Diagnosis not present

## 2015-05-02 DIAGNOSIS — I10 Essential (primary) hypertension: Secondary | ICD-10-CM | POA: Diagnosis not present

## 2015-05-02 DIAGNOSIS — J449 Chronic obstructive pulmonary disease, unspecified: Secondary | ICD-10-CM | POA: Diagnosis not present

## 2015-06-01 DIAGNOSIS — I4891 Unspecified atrial fibrillation: Secondary | ICD-10-CM | POA: Diagnosis not present

## 2015-06-01 DIAGNOSIS — I10 Essential (primary) hypertension: Secondary | ICD-10-CM | POA: Diagnosis not present

## 2015-06-01 DIAGNOSIS — J449 Chronic obstructive pulmonary disease, unspecified: Secondary | ICD-10-CM | POA: Diagnosis not present

## 2015-06-01 DIAGNOSIS — C539 Malignant neoplasm of cervix uteri, unspecified: Secondary | ICD-10-CM | POA: Diagnosis not present

## 2015-07-02 DIAGNOSIS — J449 Chronic obstructive pulmonary disease, unspecified: Secondary | ICD-10-CM | POA: Diagnosis not present

## 2015-07-02 DIAGNOSIS — I4891 Unspecified atrial fibrillation: Secondary | ICD-10-CM | POA: Diagnosis not present

## 2015-07-02 DIAGNOSIS — I10 Essential (primary) hypertension: Secondary | ICD-10-CM | POA: Diagnosis not present

## 2015-07-02 DIAGNOSIS — C539 Malignant neoplasm of cervix uteri, unspecified: Secondary | ICD-10-CM | POA: Diagnosis not present

## 2015-08-01 DIAGNOSIS — I10 Essential (primary) hypertension: Secondary | ICD-10-CM | POA: Diagnosis not present

## 2015-08-01 DIAGNOSIS — C539 Malignant neoplasm of cervix uteri, unspecified: Secondary | ICD-10-CM | POA: Diagnosis not present

## 2015-08-01 DIAGNOSIS — J449 Chronic obstructive pulmonary disease, unspecified: Secondary | ICD-10-CM | POA: Diagnosis not present

## 2015-08-01 DIAGNOSIS — I4891 Unspecified atrial fibrillation: Secondary | ICD-10-CM | POA: Diagnosis not present

## 2015-09-01 DIAGNOSIS — I4891 Unspecified atrial fibrillation: Secondary | ICD-10-CM | POA: Diagnosis not present

## 2015-09-01 DIAGNOSIS — C539 Malignant neoplasm of cervix uteri, unspecified: Secondary | ICD-10-CM | POA: Diagnosis not present

## 2015-09-01 DIAGNOSIS — J449 Chronic obstructive pulmonary disease, unspecified: Secondary | ICD-10-CM | POA: Diagnosis not present

## 2015-09-01 DIAGNOSIS — I10 Essential (primary) hypertension: Secondary | ICD-10-CM | POA: Diagnosis not present

## 2015-10-02 DIAGNOSIS — I10 Essential (primary) hypertension: Secondary | ICD-10-CM | POA: Diagnosis not present

## 2015-10-02 DIAGNOSIS — J449 Chronic obstructive pulmonary disease, unspecified: Secondary | ICD-10-CM | POA: Diagnosis not present

## 2015-10-02 DIAGNOSIS — I4891 Unspecified atrial fibrillation: Secondary | ICD-10-CM | POA: Diagnosis not present

## 2015-10-02 DIAGNOSIS — C539 Malignant neoplasm of cervix uteri, unspecified: Secondary | ICD-10-CM | POA: Diagnosis not present

## 2015-10-17 ENCOUNTER — Emergency Department (HOSPITAL_COMMUNITY): Payer: Commercial Managed Care - HMO

## 2015-10-17 ENCOUNTER — Inpatient Hospital Stay (HOSPITAL_COMMUNITY)
Admission: EM | Admit: 2015-10-17 | Discharge: 2015-10-24 | DRG: 481 | Disposition: A | Discharge status: Skilled Nursing Facility | Payer: Commercial Managed Care - HMO | Attending: Pulmonary Disease | Admitting: Pulmonary Disease

## 2015-10-17 ENCOUNTER — Encounter (HOSPITAL_COMMUNITY): Payer: Self-pay | Admitting: Emergency Medicine

## 2015-10-17 DIAGNOSIS — S8992XA Unspecified injury of left lower leg, initial encounter: Secondary | ICD-10-CM | POA: Diagnosis not present

## 2015-10-17 DIAGNOSIS — I9581 Postprocedural hypotension: Secondary | ICD-10-CM | POA: Diagnosis not present

## 2015-10-17 DIAGNOSIS — L299 Pruritus, unspecified: Secondary | ICD-10-CM | POA: Diagnosis present

## 2015-10-17 DIAGNOSIS — S72002A Fracture of unspecified part of neck of left femur, initial encounter for closed fracture: Secondary | ICD-10-CM | POA: Diagnosis not present

## 2015-10-17 DIAGNOSIS — R0602 Shortness of breath: Secondary | ICD-10-CM | POA: Diagnosis not present

## 2015-10-17 DIAGNOSIS — E860 Dehydration: Secondary | ICD-10-CM | POA: Diagnosis present

## 2015-10-17 DIAGNOSIS — Z87891 Personal history of nicotine dependence: Secondary | ICD-10-CM

## 2015-10-17 DIAGNOSIS — Z7401 Bed confinement status: Secondary | ICD-10-CM | POA: Diagnosis not present

## 2015-10-17 DIAGNOSIS — Z823 Family history of stroke: Secondary | ICD-10-CM

## 2015-10-17 DIAGNOSIS — Z9181 History of falling: Secondary | ICD-10-CM | POA: Diagnosis not present

## 2015-10-17 DIAGNOSIS — J9811 Atelectasis: Secondary | ICD-10-CM | POA: Diagnosis not present

## 2015-10-17 DIAGNOSIS — I481 Persistent atrial fibrillation: Secondary | ICD-10-CM | POA: Diagnosis not present

## 2015-10-17 DIAGNOSIS — S79912A Unspecified injury of left hip, initial encounter: Secondary | ICD-10-CM | POA: Diagnosis not present

## 2015-10-17 DIAGNOSIS — Y92009 Unspecified place in unspecified non-institutional (private) residence as the place of occurrence of the external cause: Secondary | ICD-10-CM

## 2015-10-17 DIAGNOSIS — Z452 Encounter for adjustment and management of vascular access device: Secondary | ICD-10-CM | POA: Diagnosis not present

## 2015-10-17 DIAGNOSIS — I10 Essential (primary) hypertension: Secondary | ICD-10-CM | POA: Diagnosis not present

## 2015-10-17 DIAGNOSIS — S72142A Displaced intertrochanteric fracture of left femur, initial encounter for closed fracture: Principal | ICD-10-CM | POA: Diagnosis present

## 2015-10-17 DIAGNOSIS — Z8673 Personal history of transient ischemic attack (TIA), and cerebral infarction without residual deficits: Secondary | ICD-10-CM

## 2015-10-17 DIAGNOSIS — W19XXXA Unspecified fall, initial encounter: Secondary | ICD-10-CM

## 2015-10-17 DIAGNOSIS — I482 Chronic atrial fibrillation, unspecified: Secondary | ICD-10-CM | POA: Insufficient documentation

## 2015-10-17 DIAGNOSIS — Z7901 Long term (current) use of anticoagulants: Secondary | ICD-10-CM | POA: Diagnosis not present

## 2015-10-17 DIAGNOSIS — M25552 Pain in left hip: Secondary | ICD-10-CM | POA: Diagnosis not present

## 2015-10-17 DIAGNOSIS — W010XXA Fall on same level from slipping, tripping and stumbling without subsequent striking against object, initial encounter: Secondary | ICD-10-CM | POA: Diagnosis present

## 2015-10-17 DIAGNOSIS — F0390 Unspecified dementia without behavioral disturbance: Secondary | ICD-10-CM | POA: Diagnosis present

## 2015-10-17 DIAGNOSIS — I248 Other forms of acute ischemic heart disease: Secondary | ICD-10-CM | POA: Diagnosis not present

## 2015-10-17 DIAGNOSIS — T148 Other injury of unspecified body region: Secondary | ICD-10-CM | POA: Diagnosis not present

## 2015-10-17 DIAGNOSIS — I959 Hypotension, unspecified: Secondary | ICD-10-CM | POA: Diagnosis not present

## 2015-10-17 DIAGNOSIS — I4891 Unspecified atrial fibrillation: Secondary | ICD-10-CM

## 2015-10-17 DIAGNOSIS — Y92002 Bathroom of unspecified non-institutional (private) residence single-family (private) house as the place of occurrence of the external cause: Secondary | ICD-10-CM | POA: Diagnosis not present

## 2015-10-17 DIAGNOSIS — R079 Chest pain, unspecified: Secondary | ICD-10-CM | POA: Diagnosis not present

## 2015-10-17 DIAGNOSIS — S72009G Fracture of unspecified part of neck of unspecified femur, subsequent encounter for closed fracture with delayed healing: Secondary | ICD-10-CM | POA: Diagnosis not present

## 2015-10-17 DIAGNOSIS — F4489 Other dissociative and conversion disorders: Secondary | ICD-10-CM | POA: Diagnosis not present

## 2015-10-17 DIAGNOSIS — F331 Major depressive disorder, recurrent, moderate: Secondary | ICD-10-CM | POA: Diagnosis not present

## 2015-10-17 DIAGNOSIS — W19XXXS Unspecified fall, sequela: Secondary | ICD-10-CM | POA: Diagnosis not present

## 2015-10-17 DIAGNOSIS — R296 Repeated falls: Secondary | ICD-10-CM | POA: Diagnosis not present

## 2015-10-17 DIAGNOSIS — F419 Anxiety disorder, unspecified: Secondary | ICD-10-CM | POA: Diagnosis not present

## 2015-10-17 DIAGNOSIS — S72009A Fracture of unspecified part of neck of unspecified femur, initial encounter for closed fracture: Secondary | ICD-10-CM

## 2015-10-17 DIAGNOSIS — I9589 Other hypotension: Secondary | ICD-10-CM | POA: Diagnosis not present

## 2015-10-17 DIAGNOSIS — S72142G Displaced intertrochanteric fracture of left femur, subsequent encounter for closed fracture with delayed healing: Secondary | ICD-10-CM | POA: Diagnosis not present

## 2015-10-17 HISTORY — DX: Unspecified dementia, unspecified severity, without behavioral disturbance, psychotic disturbance, mood disturbance, and anxiety: F03.90

## 2015-10-17 LAB — DIGOXIN LEVEL

## 2015-10-17 LAB — CBC WITH DIFFERENTIAL/PLATELET
Basophils Absolute: 0 10*3/uL (ref 0.0–0.1)
Basophils Relative: 0 %
Eosinophils Absolute: 0.2 10*3/uL (ref 0.0–0.7)
Eosinophils Relative: 3 %
HEMATOCRIT: 41.6 % (ref 36.0–46.0)
Hemoglobin: 13.2 g/dL (ref 12.0–15.0)
Lymphocytes Relative: 17 %
Lymphs Abs: 1.3 10*3/uL (ref 0.7–4.0)
MCH: 26.1 pg (ref 26.0–34.0)
MCHC: 31.7 g/dL (ref 30.0–36.0)
MCV: 82.4 fL (ref 78.0–100.0)
MONO ABS: 0.6 10*3/uL (ref 0.1–1.0)
MONOS PCT: 8 %
Neutro Abs: 5.6 10*3/uL (ref 1.7–7.7)
Neutrophils Relative %: 72 %
Platelets: 174 10*3/uL (ref 150–400)
RBC: 5.05 MIL/uL (ref 3.87–5.11)
RDW: 16 % — AB (ref 11.5–15.5)
WBC: 7.8 10*3/uL (ref 4.0–10.5)

## 2015-10-17 LAB — URINALYSIS, ROUTINE W REFLEX MICROSCOPIC
BILIRUBIN URINE: NEGATIVE
Glucose, UA: NEGATIVE mg/dL
Hgb urine dipstick: NEGATIVE
KETONES UR: NEGATIVE mg/dL
LEUKOCYTES UA: NEGATIVE
NITRITE: NEGATIVE
PH: 5 (ref 5.0–8.0)
Protein, ur: NEGATIVE mg/dL
SPECIFIC GRAVITY, URINE: 1.02 (ref 1.005–1.030)

## 2015-10-17 LAB — PROTIME-INR
INR: 1.53 — ABNORMAL HIGH (ref 0.00–1.49)
Prothrombin Time: 18.5 seconds — ABNORMAL HIGH (ref 11.6–15.2)

## 2015-10-17 LAB — BASIC METABOLIC PANEL
ANION GAP: 7 (ref 5–15)
BUN: 19 mg/dL (ref 6–20)
CALCIUM: 8.7 mg/dL — AB (ref 8.9–10.3)
CO2: 31 mmol/L (ref 22–32)
CREATININE: 0.82 mg/dL (ref 0.44–1.00)
Chloride: 103 mmol/L (ref 101–111)
GFR calc Af Amer: >60 mL/min (ref >60)
GFR calc non Af Amer: >60 mL/min (ref >60)
GLUCOSE: 95 mg/dL (ref 65–99)
Potassium: 4.2 mmol/L (ref 3.5–5.1)
Sodium: 141 mmol/L (ref 135–145)

## 2015-10-17 MED ORDER — PRAVASTATIN SODIUM 40 MG PO TABS
40.0000 mg | ORAL_TABLET | Freq: Every evening | ORAL | Status: DC
  Administered 2015-10-17 – 2015-10-24 (×8): 40 mg via ORAL
  Filled 2015-10-17 (×8): qty 1

## 2015-10-17 MED ORDER — MORPHINE SULFATE (PF) 2 MG/ML IV SOLN
2.0000 mg | INTRAVENOUS | Status: DC | PRN
Start: 2015-10-17 — End: 2015-10-24
  Administered 2015-10-17 – 2015-10-23 (×7): 2 mg via INTRAVENOUS
  Filled 2015-10-17 (×7): qty 1

## 2015-10-17 MED ORDER — BENAZEPRIL HCL 10 MG PO TABS
20.0000 mg | ORAL_TABLET | Freq: Every day | ORAL | Status: DC
  Administered 2015-10-17 – 2015-10-18 (×2): 20 mg via ORAL
  Filled 2015-10-17 (×2): qty 2

## 2015-10-17 MED ORDER — SODIUM CHLORIDE 0.9 % IV SOLN
INTRAVENOUS | Status: DC
  Administered 2015-10-17: 23:00:00 via INTRAVENOUS

## 2015-10-17 MED ORDER — ALBUTEROL SULFATE (2.5 MG/3ML) 0.083% IN NEBU: 2.5000 mg | INHALATION_SOLUTION | RESPIRATORY_TRACT | Status: DC | PRN

## 2015-10-17 MED ORDER — OXYCODONE HCL 5 MG PO TABS: 5.0000 mg | ORAL_TABLET | ORAL | Status: DC | PRN

## 2015-10-17 MED ORDER — FENTANYL CITRATE (PF) 100 MCG/2ML IJ SOLN
50.0000 ug | Freq: Once | INTRAMUSCULAR | Status: AC
  Administered 2015-10-17: 50 ug via INTRAVENOUS
  Filled 2015-10-17: qty 2

## 2015-10-17 MED ORDER — HYDROCODONE-ACETAMINOPHEN 10-325 MG PO TABS
1.0000 | ORAL_TABLET | Freq: Four times a day (QID) | ORAL | Status: DC
  Administered 2015-10-18 – 2015-10-20 (×6): 1 via ORAL
  Filled 2015-10-17 (×6): qty 1

## 2015-10-17 MED ORDER — DONEPEZIL HCL 5 MG PO TABS
20.0000 mg | ORAL_TABLET | Freq: Every day | ORAL | Status: DC
  Administered 2015-10-17 – 2015-10-23 (×7): 20 mg via ORAL
  Filled 2015-10-17 (×7): qty 4

## 2015-10-17 MED ORDER — DIGOXIN 125 MCG PO TABS
0.1250 mg | ORAL_TABLET | Freq: Every day | ORAL | Status: DC
  Administered 2015-10-17 – 2015-10-20 (×3): 0.125 mg via ORAL
  Filled 2015-10-17 (×3): qty 1

## 2015-10-17 MED ORDER — SENNA 8.6 MG PO TABS
1.0000 | ORAL_TABLET | Freq: Two times a day (BID) | ORAL | Status: DC
  Administered 2015-10-17 – 2015-10-24 (×13): 8.6 mg via ORAL
  Filled 2015-10-17 (×13): qty 1

## 2015-10-17 MED ORDER — ONDANSETRON HCL 4 MG/2ML IJ SOLN: 4.0000 mg | Freq: Four times a day (QID) | INTRAMUSCULAR | Status: DC | PRN

## 2015-10-17 MED ORDER — SODIUM CHLORIDE 0.9 % IV BOLUS (SEPSIS)
500.0000 mL | Freq: Once | INTRAVENOUS | Status: AC
  Administered 2015-10-17: 500 mL via INTRAVENOUS

## 2015-10-17 MED ORDER — ONDANSETRON HCL 4 MG PO TABS: 4.0000 mg | ORAL_TABLET | Freq: Four times a day (QID) | ORAL | Status: DC | PRN

## 2015-10-17 MED ORDER — LORAZEPAM 0.5 MG PO TABS: 0.5000 mg | ORAL_TABLET | Freq: Four times a day (QID) | ORAL | Status: DC | PRN

## 2015-10-17 MED ORDER — GABAPENTIN 400 MG PO CAPS
400.0000 mg | ORAL_CAPSULE | Freq: Three times a day (TID) | ORAL | Status: DC
  Administered 2015-10-17 – 2015-10-24 (×20): 400 mg via ORAL
  Filled 2015-10-17 (×20): qty 1

## 2015-10-17 MED ORDER — ACETAMINOPHEN 325 MG PO TABS: 650.0000 mg | ORAL_TABLET | Freq: Four times a day (QID) | ORAL | Status: DC | PRN

## 2015-10-17 MED ORDER — OXYCODONE-ACETAMINOPHEN 5-325 MG PO TABS: 1.0000 | ORAL_TABLET | ORAL | Status: DC | PRN

## 2015-10-17 MED ORDER — METHOCARBAMOL 500 MG PO TABS
500.0000 mg | ORAL_TABLET | Freq: Four times a day (QID) | ORAL | Status: DC | PRN
  Administered 2015-10-23: 500 mg via ORAL
  Filled 2015-10-17: qty 1

## 2015-10-17 MED ORDER — DOXEPIN HCL 50 MG PO CAPS
150.0000 mg | ORAL_CAPSULE | Freq: Every day | ORAL | Status: DC
Start: 2015-10-17 — End: 2015-10-24
  Administered 2015-10-17 – 2015-10-23 (×7): 150 mg via ORAL
  Filled 2015-10-17: qty 6
  Filled 2015-10-17 (×3): qty 3
  Filled 2015-10-17: qty 6
  Filled 2015-10-17 (×2): qty 3

## 2015-10-17 MED ORDER — METOPROLOL TARTRATE 50 MG PO TABS
50.0000 mg | ORAL_TABLET | Freq: Two times a day (BID) | ORAL | Status: DC
  Administered 2015-10-17 – 2015-10-21 (×7): 50 mg via ORAL
  Filled 2015-10-17 (×7): qty 1

## 2015-10-17 MED ORDER — DILTIAZEM HCL ER COATED BEADS 120 MG PO CP24
120.0000 mg | ORAL_CAPSULE | Freq: Every day | ORAL | Status: DC
  Administered 2015-10-18: 120 mg via ORAL
  Filled 2015-10-17: qty 1

## 2015-10-17 MED ORDER — ACETAMINOPHEN 650 MG RE SUPP: 650.0000 mg | Freq: Four times a day (QID) | RECTAL | Status: DC | PRN

## 2015-10-17 MED ORDER — TRAZODONE HCL 50 MG PO TABS
25.0000 mg | ORAL_TABLET | Freq: Every evening | ORAL | Status: DC | PRN
  Administered 2015-10-17 – 2015-10-18 (×2): 25 mg via ORAL
  Filled 2015-10-17 (×2): qty 1

## 2015-10-17 MED ORDER — SERTRALINE HCL 50 MG PO TABS
200.0000 mg | ORAL_TABLET | Freq: Every morning | ORAL | Status: DC
  Administered 2015-10-18 – 2015-10-24 (×6): 200 mg via ORAL
  Filled 2015-10-17 (×6): qty 4

## 2015-10-17 NOTE — ED Notes (Signed)
Dr. Christy Gentles at bedside updating patient and family.

## 2015-10-17 NOTE — ED Notes (Signed)
Pt requesting medication for pain. Dr. Christy Gentles notified.

## 2015-10-17 NOTE — ED Notes (Signed)
Pt given dinner meal tray with MD approval. 

## 2015-10-17 NOTE — Progress Notes (Signed)
Pharmacy called with questions on medications benazepril and digoxin, paged on call MD will follow any orders given and continue to monitor the patient.

## 2015-10-17 NOTE — ED Notes (Signed)
Per EMS, pt from home reports a fall on Friday. States she was walking to the bathroom when she stumbled and fell. EMS states that pt was requesting to come to ED all weekend, but husband refused to take her. Pt c/o LT hip pain. No shortening or deformity noted. Pt ambulated all weekend with a walker per EMS.

## 2015-10-17 NOTE — ED Notes (Signed)
Phlebotomy at bedside.

## 2015-10-17 NOTE — ED Provider Notes (Signed)
CSN: [id number]     Arrival date & time 10/17/15  1416 History   First MD Initiated Contact with Patient 10/17/15 1430     Chief Complaint  Patient presents with  . Fall    Patient is a 75 y.o. female presenting with fall. The history is provided by the patient.  Fall This is a new problem. The current episode started more than 2 days ago. The problem occurs daily. The problem has been gradually worsening. Pertinent negatives include no chest pain, no abdominal pain and no headaches. The symptoms are aggravated by walking. The symptoms are relieved by rest.  pt reports mechanical fall 3 days ago - she was reaching for walker and fell directly on left hip No head injury No LOC No HA No vomiting No cp/sob No vomiting/diarrhea   Past Medical History  Diagnosis Date  . Arthritis   . Cancer (HCC) Cervical  . Hypertension   . Stroke ([place])   . Anxiety    Past Surgical History  Procedure Laterality Date  . Knee arthroscopy Right   . Femur closed reduction Right   . Appendectomy    . Cholecystectomy    . Brain surgery    . Tonsillectomy    . Orif hip fracture Right 10/08/2014    Procedure: OPEN REDUCTION INTERNAL FIXATION RIGHT HIP;  Surgeon: Sanjuana Kava, MD;  Location: [facility];  Service: Orthopedics;  Laterality: Right;   Family History  Problem Relation Age of Onset  . Stroke Mother    Social History  Substance Use Topics  . Smoking status: Former Research scientist (life sciences)  . Smokeless tobacco: Never Used  . Alcohol Use: No   OB History    No data available     Review of Systems  Constitutional: Negative for fever.  Cardiovascular: Negative for chest pain.  Gastrointestinal: Negative for vomiting and abdominal pain.  Musculoskeletal: Positive for arthralgias. Negative for back pain and neck pain.  Neurological: Negative for headaches.  All other systems reviewed and are negative.     Allergies  Penicillins  Home Medications   Prior to Admission medications   Medication Sig  Start Date End Date Taking? Authorizing Provider  apixaban (ELIQUIS) 5 MG TABS tablet Take 1 tablet (5 mg total) by mouth 2 (two) times daily. 10/11/14  Yes Sinda Du, MD  CALCIUM PO Take 2 tablets by mouth every morning.   Yes Historical Provider, MD  diltiazem (CARDIZEM CD) 120 MG 24 hr capsule Take 1 capsule (120 mg total) by mouth daily. 10/11/14  Yes Sinda Du, MD  donepezil (ARICEPT) 10 MG tablet Take 20 mg by mouth at bedtime.   Yes Historical Provider, MD  doxepin (SINEQUAN) 150 MG capsule Take 150 mg by mouth at bedtime.  09/02/14  Yes Historical Provider, MD  gabapentin (NEURONTIN) 400 MG capsule Take 400 mg by mouth 3 (three) times daily.    Yes Historical Provider, MD  HYDROcodone-acetaminophen (NORCO) 10-325 MG per tablet Take 1 tablet by mouth 4 (four) times daily.   Yes Historical Provider, MD  LORazepam (ATIVAN) 0.5 MG tablet Take 1 tablet (0.5 mg total) by mouth every 6 (six) hours as needed for anxiety. 10/11/14  Yes Sinda Du, MD  methocarbamol (ROBAXIN) 500 MG tablet Take 1 tablet (500 mg total) by mouth every 6 (six) hours as needed for muscle spasms. 10/11/14  Yes Sinda Du, MD  metoprolol (LOPRESSOR) 50 MG tablet Take 1 tablet (50 mg total) by mouth every 6 (six) hours. Patient taking differently: Take  50 mg by mouth 2 (two) times daily.  10/11/14  Yes Sinda Du, MD  pravastatin (PRAVACHOL) 40 MG tablet Take 40 mg by mouth every evening.   Yes Historical Provider, MD  sertraline (ZOLOFT) 100 MG tablet Take 200 mg by mouth every morning.    Yes Historical Provider, MD  zolpidem (AMBIEN) 5 MG tablet Take 5 mg by mouth at bedtime as needed. For sleep 10/31/14  Yes Historical Provider, MD  albuterol (PROVENTIL) (2.5 MG/3ML) 0.083% nebulizer solution Take 3 mLs (2.5 mg total) by nebulization every 4 (four) hours as needed for wheezing or shortness of breath. Patient not taking: Reported on 10/17/2015 10/11/14   Sinda Du, MD  benazepril (LOTENSIN) 20 MG tablet  Take 20 mg by mouth daily. Reported on 10/17/2015    Historical Provider, MD  digoxin (LANOXIN) 0.125 MG tablet Take 1 tablet (0.125 mg total) by mouth daily. Patient not taking: Reported on 10/17/2015 10/11/14   Sinda Du, MD  oxyCODONE-acetaminophen (PERCOCET/ROXICET) 5-325 MG per tablet Take 1 tablet by mouth every 4 (four) hours as needed for severe pain. Patient not taking: Reported on 10/17/2015 11/15/14   Virgel Manifold, MD   BP 97/76 mmHg  Pulse 91  Temp(Src) 97.6 F (36.4 C) (Oral)  Resp 19  SpO2 100% Physical Exam CONSTITUTIONAL: Elderly, frail HEAD: Normocephalic/atraumatic EYES: EOMI ENMT: Mucous membranes moist NECK: supple no meningeal signs SPINE/BACK:entire spine nontender CV: irregular LUNGS: Lungs are clear to auscultation bilaterally ABDOMEN: soft, nontender NEURO: Pt is awake/alert/appropriate, moves all extremitiesx4.  EXTREMITIES: pulses normal/equal in both feet.  Left LE with external rotation/shortening.  Tenderness with ROM of left hip.  Mild left knee tenderness.  No foot or ankle tenderness noted SKIN: warm, color normal PSYCH: no abnormalities of mood noted, alert and oriented to situation  ED Course  Procedures   3:21 PM Pt with probable left hip fx Imaging/labs pending at this time 5:02 PM Pt with left hip fx D/w dr Aline Brochure - medicine admit and likely operative repair in 48 hours as she is on eliquis Pt stable/updated on plan 5:08 PM D/w dr courage Will admit to medical service  Labs Review Labs Reviewed  BASIC METABOLIC PANEL - Abnormal; Notable for the following:    Calcium 8.7 (*)    All other components within normal limits  CBC WITH DIFFERENTIAL/PLATELET - Abnormal; Notable for the following:    RDW 16.0 (*)    All other components within normal limits  PROTIME-INR - Abnormal; Notable for the following:    Prothrombin Time 18.5 (*)    INR 1.53 (*)    All other components within normal limits  DIGOXIN LEVEL - Abnormal; Notable  for the following:    Digoxin Level <0.2 (*)    All other components within normal limits  TYPE AND SCREEN    Imaging Review Dg Chest 1 View  10/17/2015  CLINICAL DATA:  Pain.  Fall.  Initial encounter. EXAM: CHEST 1 VIEW COMPARISON:  09/30/2014 FINDINGS: The cardiac silhouette remains mildly enlarged. The lungs are hypoinflated with chronic diffuse interstitial prominence, not significantly changed. Slightly increased density is present in the lung bases compared to the prior study. No overt pulmonary edema, sizable pleural effusion, or pneumothorax is identified. No acute osseous abnormality is identified. IMPRESSION: New, mild bibasilar opacities, likely atelectasis. Electronically Signed   By: Logan Bores M.D.   On: 10/17/2015 16:10   Dg Knee 1-2 Views Left  10/17/2015  CLINICAL DATA:  [place] Friday. Lt hip pain. Pt states  no knee pain EXAM: LEFT KNEE - 1-2 VIEW COMPARISON:  None. FINDINGS: Small marginal spurs about the medial compartment. Narrowing of the articular cartilage. Negative for fracture or dislocation. No definite effusion. Patchy femoral-popliteal arterial calcifications. IMPRESSION: 1. Mild medial compartment degenerative spurring. No fracture or dislocation. Electronically Signed   By: Lucrezia Europe M.D.   On: 10/17/2015 16:11   Dg Hip Unilat With Pelvis 2-3 Views Left  10/17/2015  CLINICAL DATA:  She fell on Friday and landed on her lt side. She asked to come to the hospital all weekend and her husband refused to bring her. She did ambulate with a walker over the weekend. Hx of rt hip surgery about 1 yr ago. EXAM: DG HIP (WITH OR WITHOUT PELVIS) 2-3V LEFT COMPARISON:  09/30/2014 FINDINGS: Interval Previous sliding screw and plate fixation across the right femoral neck, hardware incompletely visualized. Acute comminuted left intertrochanteric fracture, impacted with a varus angulation deformity. Bony pelvis intact.  Patchy iliofemoral arterial calcifications. IMPRESSION: 1. Comminuted  impacted left intertrochanteric femur fracture. Electronically Signed   By: Lucrezia Europe M.D.   On: 10/17/2015 16:13   I have personally reviewed and evaluated these images and lab results as part of my medical decision-making.   EKG Interpretation   Date/Time:  Monday October 17 2015 14:31:41 EST Ventricular Rate:  96 PR Interval:    QRS Duration: 79 QT Interval:  368 QTC Calculation: 465 R Axis:   62 Text Interpretation:  Atrial fibrillation Abnormal R-wave progression,  early transition No significant change since last tracing Confirmed by  Christy Gentles  MD, Elenore Rota ([id number]) on 10/17/2015 2:44:06 PM     Medications  fentaNYL (SUBLIMAZE) injection 50 mcg (50 mcg Intravenous Given 10/17/15 1517)  sodium chloride 0.9 % bolus 500 mL (0 mLs Intravenous Stopped 10/17/15 1637)    MDM   Final diagnoses:  Fracture, intertrochanteric, left femur, closed, initial encounter ([employer])  Chronic atrial fibrillation ([place])    Nursing notes including past medical history and social history reviewed and considered in documentation xrays/imaging reviewed by myself and considered during evaluation Labs/vital reviewed myself and considered during evaluation     Ripley Fraise, MD 10/17/15 1708

## 2015-10-17 NOTE — H&P (Signed)
Patient Demographics:    Tami [name], is a 75 y.o. female  MRN: [id number]   DOB - 10-02-1940  Admit Date - 10/17/2015  Outpatient Primary MD for the patient is Alonza Bogus, MD   With History of -  Past Medical History  Diagnosis Date  . Arthritis   . Cancer (HCC) Cervical  . Hypertension   . Stroke ([place])   . Anxiety       Past Surgical History  Procedure Laterality Date  . Knee arthroscopy Right   . Femur closed reduction Right   . Appendectomy    . Cholecystectomy    . Brain surgery    . Tonsillectomy    . Orif hip fracture Right 10/08/2014    Procedure: OPEN REDUCTION INTERNAL FIXATION RIGHT HIP;  Surgeon: Sanjuana Kava, MD;  Location: [facility];  Service: Orthopedics;  Laterality: Right;      Chief Complaint  Patient presents with  . Fall      HPI:    Tami [name]  is a 75 y.o. female,   Patient is a 75 y.o. female with past medical history relevant for history of falls who is also on full anticoagulation for atrial fibrillation presenting with fall. The history is provided by the patient and her husband at bedside.  pt and her husband reports mechanical fall 3 days ago - she was reaching for walker while in the bathroom area and fell directly on left hip, her husband heard a loud noise, he reports No head injury, No LOC, No HA, No vomiting, No cp/sob, No vomiting/diarrhea. Patient and her Husband denied syncope   Review of systems:    In addition to the HPI above,   A full 12 point Review of Systems was done, except as stated above, all other Review of Systems were negative.    Social History:     Social History  Substance Use Topics  . Smoking status: Former Research scientist (life sciences)  . Smokeless tobacco: Never Used  . Alcohol Use: No   Lives - husband  Mobility - prior ton fall she was  independent with a walker   Family History :     Family History  Problem Relation Age of Onset  . Stroke Mother      Home Medications:   Prior to Admission medications   Medication Sig Start Date End Date Taking? Authorizing Provider  apixaban (ELIQUIS) 5 MG TABS tablet Take 1 tablet (5 mg total) by mouth 2 (two) times daily. 10/11/14  Yes Sinda Du, MD  CALCIUM PO Take 2 tablets by mouth every morning.   Yes Historical Provider, MD  diltiazem (CARDIZEM CD) 120 MG 24 hr capsule Take 1 capsule (120 mg total) by mouth daily. 10/11/14  Yes Sinda Du, MD  donepezil (ARICEPT) 10 MG tablet Take 20 mg by mouth at bedtime.   Yes Historical Provider, MD  doxepin (SINEQUAN) 150 MG capsule Take 150 mg by  mouth at bedtime.  09/02/14  Yes Historical Provider, MD  gabapentin (NEURONTIN) 400 MG capsule Take 400 mg by mouth 3 (three) times daily.    Yes Historical Provider, MD  HYDROcodone-acetaminophen (NORCO) 10-325 MG per tablet Take 1 tablet by mouth 4 (four) times daily.   Yes Historical Provider, MD  LORazepam (ATIVAN) 0.5 MG tablet Take 1 tablet (0.5 mg total) by mouth every 6 (six) hours as needed for anxiety. 10/11/14  Yes Sinda Du, MD  methocarbamol (ROBAXIN) 500 MG tablet Take 1 tablet (500 mg total) by mouth every 6 (six) hours as needed for muscle spasms. 10/11/14  Yes Sinda Du, MD  metoprolol (LOPRESSOR) 50 MG tablet Take 1 tablet (50 mg total) by mouth every 6 (six) hours. Patient taking differently: Take 50 mg by mouth 2 (two) times daily.  10/11/14  Yes Sinda Du, MD  pravastatin (PRAVACHOL) 40 MG tablet Take 40 mg by mouth every evening.   Yes Historical Provider, MD  sertraline (ZOLOFT) 100 MG tablet Take 200 mg by mouth every morning.    Yes Historical Provider, MD  zolpidem (AMBIEN) 5 MG tablet Take 5 mg by mouth at bedtime as needed. For sleep 10/31/14  Yes Historical Provider, MD  albuterol (PROVENTIL) (2.5 MG/3ML) 0.083% nebulizer solution Take 3 mLs (2.5 mg  total) by nebulization every 4 (four) hours as needed for wheezing or shortness of breath. Patient not taking: Reported on 10/17/2015 10/11/14   Sinda Du, MD  benazepril (LOTENSIN) 20 MG tablet Take 20 mg by mouth daily. Reported on 10/17/2015    Historical Provider, MD  digoxin (LANOXIN) 0.125 MG tablet Take 1 tablet (0.125 mg total) by mouth daily. Patient not taking: Reported on 10/17/2015 10/11/14   Sinda Du, MD  oxyCODONE-acetaminophen (PERCOCET/ROXICET) 5-325 MG per tablet Take 1 tablet by mouth every 4 (four) hours as needed for severe pain. Patient not taking: Reported on 10/17/2015 11/15/14   Virgel Manifold, MD     Allergies:     Allergies  Allergen Reactions  . Penicillins Other (See Comments)    Child hood allergy(unknown reaction)     Physical Exam:   Vitals  Blood pressure 98/70, pulse 70, temperature 97.6 F (36.4 C), temperature source Oral, resp. rate 12, SpO2 95 %.     General lying in bed in NAD,    Physical Examination: General appearance - alert, well appearing, and in no distress and husband at bedside Mental status - alert, with cognitive deficits  Eyes - sclera anicteric Neck - supple, no JVD elevation , Chest - clear  to auscultation bilaterally, symmetrical air movement,  Heart -  irregularly irregular, heart rate in the 90s Abdomen - soft, nontender, nondistended, no masses or organomegaly Neurological - screening mental status exam normal, neck supple without rigidity, cranial nerves II through XII intact,  Extremities - no pedal edema noted, intact peripheral pulses, left lower extremity is shortened and rotated Skin - forehead/frontal scalp area with erythematous Rash, no streaking, no ecchymoses, no warmt, no drainage    Data Review:    CBC  Recent Labs Lab 10/17/15 1524  WBC 7.8  HGB 13.2  HCT 41.6  PLT 174  MCV 82.4  MCH 26.1  MCHC 31.7  RDW 16.0*  LYMPHSABS 1.3  MONOABS 0.6  EOSABS 0.2  BASOSABS 0.0    ------------------------------------------------------------------------------------------------------------------  Chemistries   Recent Labs Lab 10/17/15 1524  NA 141  K 4.2  CL 103  CO2 31  GLUCOSE 95  BUN 19  CREATININE 0.82  CALCIUM 8.7*   ------------------------------------------------------------------------------------------------------------------ CrCl cannot be calculated (Unknown ideal weight.). ------------------------------------------------------------------------------------------------------------------ No results for input(s): TSH, T4TOTAL, T3FREE, THYROIDAB in the last 72 hours.  Invalid input(s): FREET3   Coagulation profile  Recent Labs Lab 10/17/15 1524  INR 1.53*   ------------------------------------------------------------------------------------------------------------------- No results for input(s): DDIMER in the last 72 hours. -------------------------------------------------------------------------------------------------------------------  Cardiac Enzymes No results for input(s): CKMB, TROPONINI, MYOGLOBIN in the last 168 hours.  Invalid input(s): CK ------------------------------------------------------------------------------------------------------------------    Component Value Date/Time   BNP 348.0* 09/30/2014 2332     ---------------------------------------------------------------------------------------------------------------  Urinalysis    Component Value Date/Time   COLORURINE YELLOW 10/01/2014 0040   APPEARANCEUR CLEAR 10/01/2014 0040   LABSPEC 1.020 10/01/2014 0040   PHURINE 5.5 10/01/2014 0040   GLUCOSEU NEGATIVE 10/01/2014 0040   HGBUR NEGATIVE 10/01/2014 0040   HGBUR negative 04/21/2008 1015   BILIRUBINUR NEGATIVE 10/01/2014 0040   KETONESUR NEGATIVE 10/01/2014 0040   PROTEINUR NEGATIVE 10/01/2014 0040   UROBILINOGEN 0.2 10/01/2014 0040   NITRITE NEGATIVE 10/01/2014 0040   LEUKOCYTESUR NEGATIVE 10/01/2014  0040    ----------------------------------------------------------------------------------------------------------------   Imaging Results:    Dg Chest 1 View  10/17/2015  CLINICAL DATA:  Pain.  Fall.  Initial encounter. EXAM: CHEST 1 VIEW COMPARISON:  09/30/2014 FINDINGS: The cardiac silhouette remains mildly enlarged. The lungs are hypoinflated with chronic diffuse interstitial prominence, not significantly changed. Slightly increased density is present in the lung bases compared to the prior study. No overt pulmonary edema, sizable pleural effusion, or pneumothorax is identified. No acute osseous abnormality is identified. IMPRESSION: New, mild bibasilar opacities, likely atelectasis. Electronically Signed   By: Logan Bores M.D.   On: 10/17/2015 16:10   Dg Knee 1-2 Views Left  10/17/2015  CLINICAL DATA:  [place] Friday. Lt hip pain. Pt states no knee pain EXAM: LEFT KNEE - 1-2 VIEW COMPARISON:  None. FINDINGS: Small marginal spurs about the medial compartment. Narrowing of the articular cartilage. Negative for fracture or dislocation. No definite effusion. Patchy femoral-popliteal arterial calcifications. IMPRESSION: 1. Mild medial compartment degenerative spurring. No fracture or dislocation. Electronically Signed   By: Lucrezia Europe M.D.   On: 10/17/2015 16:11   Dg Hip Unilat With Pelvis 2-3 Views Left  10/17/2015  CLINICAL DATA:  She fell on Friday and landed on her lt side. She asked to come to the hospital all weekend and her husband refused to bring her. She did ambulate with a walker over the weekend. Hx of rt hip surgery about 1 yr ago. EXAM: DG HIP (WITH OR WITHOUT PELVIS) 2-3V LEFT COMPARISON:  09/30/2014 FINDINGS: Interval Previous sliding screw and plate fixation across the right femoral neck, hardware incompletely visualized. Acute comminuted left intertrochanteric fracture, impacted with a varus angulation deformity. Bony pelvis intact.  Patchy iliofemoral arterial calcifications.  IMPRESSION: 1. Comminuted impacted left intertrochanteric femur fracture. Electronically Signed   By: Lucrezia Europe M.D.   On: 10/17/2015 16:13     Assessment & Plan:    Principal Problem:   Hip fracture, left ([place]) Active Problems:   Essential hypertension   Atrial fibrillation with RVR (HCC)   Fall at home   Dementia    1)Lt Hip Fx- Status post presumably mechanical fall with left hip fracture, Orthopedic surgeon to see, Eliquis on hold to allow for ORIF in 48 hrs (last dose 10/17/15 am), opiates for pain control when necessary, get PT OT consult  2) Dementia- with some cognitive deficits, continue Aricept, fall precautions  3)Atrial fibrillation- continue digoxin, Cardizem 120 mg and  metoprolol 50 mg  for rate control, Eliquis is on hold to allow for ORIF as above in # 1   DVT Prophylaxis - Eliquis (see # 1 above) AM Labs Ordered, also please review Full Orders  Family Communication: Admission, patients condition and plan of care including tests being ordered have been discussed with the patient and Husband who indicate understanding and agree with the plan   Code Status Full  Likely DC to  SNF for subacute rehabilitation  Condition   fair  Enora Trillo M.D on 10/17/2015 at 6:21 PM   Between 7am to 7pm - Pager - [phone]  After 7pm go to [email or web address] - password [email or web address]  [facility] - Office  [phone]  Dragon dictation system was used to create this note, attempts have been made to correct errors, however presence of uncorrected errors is not a reflection quality of care provided.

## 2015-10-17 NOTE — ED Notes (Signed)
Dr. Christy Gentles updating patient and family.

## 2015-10-17 NOTE — ED Notes (Signed)
Attempted to call report. Was advised nurse receiving pt will call this nurse back for report. 

## 2015-10-17 NOTE — ED Notes (Signed)
Admitting doctor at bedside 

## 2015-10-18 LAB — BASIC METABOLIC PANEL
ANION GAP: 8 (ref 5–15)
BUN: 22 mg/dL — ABNORMAL HIGH (ref 6–20)
CALCIUM: 8.5 mg/dL — AB (ref 8.9–10.3)
CO2: 30 mmol/L (ref 22–32)
Chloride: 102 mmol/L (ref 101–111)
Creatinine, Ser: 0.88 mg/dL (ref 0.44–1.00)
Glucose, Bld: 95 mg/dL (ref 65–99)
POTASSIUM: 4.1 mmol/L (ref 3.5–5.1)
Sodium: 140 mmol/L (ref 135–145)

## 2015-10-18 LAB — CBC
HCT: 39 % (ref 36.0–46.0)
HEMOGLOBIN: 12.2 g/dL (ref 12.0–15.0)
MCH: 26 pg (ref 26.0–34.0)
MCHC: 31.3 g/dL (ref 30.0–36.0)
MCV: 83.2 fL (ref 78.0–100.0)
Platelets: 192 10*3/uL (ref 150–400)
RBC: 4.69 MIL/uL (ref 3.87–5.11)
RDW: 16.1 % — ABNORMAL HIGH (ref 11.5–15.5)
WBC: 6.7 10*3/uL (ref 4.0–10.5)

## 2015-10-18 LAB — SURGICAL PCR SCREEN
MRSA, PCR: NEGATIVE
STAPHYLOCOCCUS AUREUS: POSITIVE — AB

## 2015-10-18 LAB — PREPARE RBC (CROSSMATCH)

## 2015-10-18 MED ORDER — MUPIROCIN 2 % EX OINT
1.0000 "application " | TOPICAL_OINTMENT | Freq: Two times a day (BID) | CUTANEOUS | Status: AC
  Administered 2015-10-18 – 2015-10-23 (×9): 1 via NASAL
  Filled 2015-10-18 (×2): qty 22

## 2015-10-18 MED ORDER — CHLORHEXIDINE GLUCONATE CLOTH 2 % EX PADS
6.0000 | MEDICATED_PAD | Freq: Every day | CUTANEOUS | Status: AC
  Administered 2015-10-18 – 2015-10-22 (×5): 6 via TOPICAL

## 2015-10-18 MED ORDER — SODIUM CHLORIDE 0.9 % IV SOLN
Freq: Once | INTRAVENOUS | Status: AC
  Administered 2015-10-18: 11:00:00 via INTRAVENOUS

## 2015-10-18 MED ORDER — CHLORHEXIDINE GLUCONATE 4 % EX LIQD
60.0000 mL | Freq: Once | CUTANEOUS | Status: AC
  Administered 2015-10-18: 4 via TOPICAL
  Filled 2015-10-18: qty 15

## 2015-10-18 NOTE — Consult Note (Signed)
HOSPITAL CONSULT [phone] (HIP FRACTURE )  MDM= MODERATE COMPLEXITY COMP HISTORY COMP EXAM  Patient ID: Tami [name], female   DOB: 1941-05-03, 75 y.o.   MRN: [id number]  New patient   Chief Complaint  Patient presents with  . Fall     Tami [name] is a 75 y.o. female.   HPI 57 rolled female status post open treatment internal fixation right hip for hip fracture percent with left hip fracture after mechanical fall.  Location of pain left hip duration one day severity is 7-10 out of 10 quality dull ache modified by movement  Review of Systems (all) Review of Systems  All other systems reviewed and are negative.    She essentially presents with a negative review of systems  Past Medical History  Diagnosis Date  . Arthritis   . Cancer (HCC) Cervical  . Hypertension   . Stroke ([place])   . Anxiety   . Dementia      Past Surgical History  Procedure Laterality Date  . Knee arthroscopy Right   . Femur closed reduction Right   . Appendectomy    . Cholecystectomy    . Brain surgery    . Tonsillectomy    . Orif hip fracture Right 10/08/2014    Procedure: OPEN REDUCTION INTERNAL FIXATION RIGHT HIP;  Surgeon: Sanjuana Kava, MD;  Location: [facility];  Service: Orthopedics;  Laterality: Right;     Family History  Problem Relation Age of Onset  . Stroke Mother      Social History Social History  Substance Use Topics  . Smoking status: Former Research scientist (life sciences)  . Smokeless tobacco: Never Used  . Alcohol Use: No     Allergies  Allergen Reactions  . Penicillins Other (See Comments)    Child hood allergy(unknown reaction)    Current Facility-Administered Medications  Medication Dose Route Frequency Provider Last Rate Last Dose  . 0.9 %  sodium chloride infusion   Intravenous Continuous Roxan Hockey, MD 50 mL/hr at 10/17/15 2247    . acetaminophen (TYLENOL) tablet 650 mg  650 mg Oral Q6H PRN Roxan Hockey, MD       Or  . acetaminophen (TYLENOL) suppository 650 mg  650 mg  Rectal Q6H PRN Courage Emokpae, MD      . albuterol (PROVENTIL) (2.5 MG/3ML) 0.083% nebulizer solution 2.5 mg  2.5 mg Nebulization Q4H PRN Courage Emokpae, MD      . benazepril (LOTENSIN) tablet 20 mg  20 mg Oral Daily Courage Emokpae, MD   20 mg at 10/18/15 1042  . digoxin (LANOXIN) tablet 0.125 mg  0.125 mg Oral Daily Courage Emokpae, MD   0.125 mg at 10/18/15 1043  . diltiazem (CARDIZEM CD) 24 hr capsule 120 mg  120 mg Oral Daily Courage Emokpae, MD   120 mg at 10/18/15 1043  . donepezil (ARICEPT) tablet 20 mg  20 mg Oral QHS Roxan Hockey, MD   20 mg at 10/17/15 2248  . doxepin (SINEQUAN) capsule 150 mg  150 mg Oral QHS Roxan Hockey, MD   150 mg at 10/17/15 2249  . gabapentin (NEURONTIN) capsule 400 mg  400 mg Oral TID Roxan Hockey, MD   400 mg at 10/18/15 1042  . HYDROcodone-acetaminophen (NORCO) 10-325 MG per tablet 1 tablet  1 tablet Oral 4 times per day Roxan Hockey, MD   1 tablet at 10/18/15 0502  . LORazepam (ATIVAN) tablet 0.5 mg  0.5 mg Oral Q6H PRN Roxan Hockey, MD      . methocarbamol (  ROBAXIN) tablet 500 mg  500 mg Oral Q6H PRN Roxan Hockey, MD      . metoprolol (LOPRESSOR) tablet 50 mg  50 mg Oral BID Roxan Hockey, MD   50 mg at 10/18/15 1042  . morphine 2 MG/ML injection 2 mg  2 mg Intravenous Q3H PRN Roxan Hockey, MD   2 mg at 10/17/15 2249  . ondansetron (ZOFRAN) tablet 4 mg  4 mg Oral Q6H PRN Roxan Hockey, MD       Or  . ondansetron (ZOFRAN) injection 4 mg  4 mg Intravenous Q6H PRN Courage Emokpae, MD      . oxyCODONE (Oxy IR/ROXICODONE) immediate release tablet 5 mg  5 mg Oral Q4H PRN Courage Emokpae, MD      . oxyCODONE-acetaminophen (PERCOCET/ROXICET) 5-325 MG per tablet 1 tablet  1 tablet Oral Q4H PRN Courage Emokpae, MD      . pravastatin (PRAVACHOL) tablet 40 mg  40 mg Oral QPM Roxan Hockey, MD   40 mg at 10/17/15 2248  . senna (SENOKOT) tablet 8.6 mg  1 tablet Oral BID Roxan Hockey, MD   8.6 mg at 10/18/15 1042  . sertraline (ZOLOFT)  tablet 200 mg  200 mg Oral q morning - 10a Roxan Hockey, MD   200 mg at 10/18/15 1042  . traZODone (DESYREL) tablet 25 mg  25 mg Oral QHS PRN Roxan Hockey, MD   25 mg at 10/17/15 2248     Physical Exam(=30) Blood pressure 127/59, pulse 67, temperature 97.5 F (36.4 C), temperature source Axillary, resp. rate 17, height 5\' 10"  (1.778 m), weight 157 lb 6.5 oz (71.4 kg), SpO2 93 %. Gen. Appearance she looks to be in good shape. She does not show any nutritional abnormalities Peripheral vascular system 2+ pulses with warm extremities normal capillary refill good color normal temperature Lymph nodes groin area no palpable lymph nodes clavicular region no palpable lymph nodes Gait the patient cannot ambulate secondary to a left hip fracture  Upper extremities  Inspection revealed no malalignment or asymmetry  Assessment of range of motion: Full range of motion was recorded  Assessment of stability: Elbow wrist and hand and shoulder were stable  Assessment of muscle strength and tone revealed grade 5 muscle strength and normal muscle tone  Skin was normal without rash lesion or ulceration  Lower extremities: Right  Inspection revealed no malalignment or asymmetry  Assessment of range of motion: Slight decreased flexion external rotation and internal rotation abduction abduction range of motion was recorded  Assessment of stability: Elbow wrist and hand and shoulder were stable  Assessment of muscle strength and tone revealed grade 5 muscle strength and normal muscle tone  Skin was normal without rash lesion or ulceration, previous incision healed nicely  Lower extremities: Left left leg is externally rotated. Is tender in the proximal femur and thigh. Stability tests were deferred because of pain muscle strength and tone were normal skin was intact without rash lesion or ulceration  Coordination was tested by finger-to-nose nose and was normal Deep tendon reflexes were 2+ in the upper  extremities deferred lower extremity because of fracture  Examination of sensation by touch was normal in all 4 extremities  Mental status  Oriented to time person and place normal  Mood and affect normal without depression anxiety or agitation  Dx:   Data Reviewed  I reviewed the images and the reports and my independent interpretation is comminuted proximal femur fracture in the intertrochanteric region Sokol peritrochanteric fracture left hip  Assessment  Ambulatory female with a walker with left intertrochanteric hip fracture on eliquis. Based on our facility and its limitations we will delay surgery for 48 hours starting from yesterday and perform internal fixation left hip with intramedullary implant on Wednesday every 22nd    Plan   Internal fixation left hip on Wednesday  This procedure has been fully reviewed with the patient no alternative treatments are available in this particular setting. The patient needs to be mobilized to prevent fracture disease. Risks of nonhealing hardware cut out implant failure blood clot infection possible limping possible leg length discrepancy has been explained.

## 2015-10-18 NOTE — Care Management Note (Signed)
Case Management Note  Patient Details  Name: Tami [name] MRN: [id number] Date of Birth: [birth year]  Subjective/Objective:       Patient from [facility] where she fell and broke hip, will need to be off of Eliquis prior to hip surgery per attending. Plan to return to [facility] at discharge.              Action/Plan:  Return to SNF.  Expected Discharge Date:  10/21/15               Expected Discharge Plan:  Skilled Nursing Facility  In-House Referral:  Clinical Social Work  Discharge planning Services  CM Consult  Post Acute Care Choice:    Choice offered to:     DME Arranged:    DME Agency:     HH Arranged:    [place] Agency:     Status of Service:  Completed, signed off  Medicare Important Message Given:    Date Medicare IM Given:    Medicare IM give by:    Date Additional Medicare IM Given:    Additional Medicare Important Message give by:     If discussed at [place] of Stay Meetings, dates discussed:    Additional Comments:  Alvie Heidelberg, RN 10/18/2015, 8:46 AM

## 2015-10-18 NOTE — Progress Notes (Signed)
Automatic BP of 86/64. Manual BP of 90/62. Pt not in distress, alert, and no pain. MD notified and aware. Ordered to discontinue lotensin. Continue other BP medications and monitor BP. Oswald Hillock, RN

## 2015-10-18 NOTE — Progress Notes (Signed)
Subjective: She fell at her nursing home breaking her hip. I was informed yesterday that she had fallen but was only told that she had struck her head and had no neurological symptoms. I did not know that she had also hurt her hip. At any rate she came to the emergency department was x-rayed and found to have a fractured hip. She has been on anticoagulation and this may need to be stopped now.  Objective: Vital signs in last 24 hours: Temp:  [97.5 F (36.4 C)-98 F (36.7 C)] 97.5 F (36.4 C) (02/21 0355) Pulse Rate:  [29-102] 64 (02/21 0355) Resp:  [12-22] 17 (02/21 0355) BP: (97-119)/(62-93) 114/91 mmHg (02/21 0355) SpO2:  [85 %-100 %] 93 % (02/21 0355) FiO2 (%):  [0 %] 0 % (02/20 2127) Weight:  [71.4 kg (157 lb 6.5 oz)] 71.4 kg (157 lb 6.5 oz) (02/20 2127) Weight change:  Last BM Date: 10/17/15  Intake/Output from previous day: 02/20 0701 - 02/21 0700 In: 500 [I.V.:500] Out: 100 [Urine:100]  PHYSICAL EXAM General appearance: alert, cooperative and mild distress Resp: clear to auscultation bilaterally Cardio: irregularly irregular rhythm GI: soft, non-tender; bowel sounds normal; no masses,  no organomegaly Extremities: She complains of pain in her right leg  Lab Results:  Results for orders placed or performed during the hospital encounter of 10/17/15 (from the past 48 hour(s))  Basic metabolic panel     Status: Abnormal   Collection Time: 10/17/15  3:24 PM  Result Value Ref Range   Sodium 141 135 - 145 mmol/L   Potassium 4.2 3.5 - 5.1 mmol/L   Chloride 103 101 - 111 mmol/L   CO2 31 22 - 32 mmol/L   Glucose, Bld 95 65 - 99 mg/dL   BUN 19 6 - 20 mg/dL   Creatinine, Ser 0.82 0.44 - 1.00 mg/dL   Calcium 8.7 (L) 8.9 - 10.3 mg/dL   GFR calc non Af Amer >60 >60 mL/min   GFR calc Af Amer >60 >60 mL/min    Comment: (NOTE) The eGFR has been calculated using the CKD EPI equation. This calculation has not been validated in all clinical situations. eGFR's persistently <60  mL/min signify possible Chronic Kidney Disease.    Anion gap 7 5 - 15  CBC with Differential/Platelet     Status: Abnormal   Collection Time: 10/17/15  3:24 PM  Result Value Ref Range   WBC 7.8 4.0 - 10.5 K/uL   RBC 5.05 3.87 - 5.11 MIL/uL   Hemoglobin 13.2 12.0 - 15.0 g/dL   HCT 41.6 36.0 - 46.0 %   MCV 82.4 78.0 - 100.0 fL   MCH 26.1 26.0 - 34.0 pg   MCHC 31.7 30.0 - 36.0 g/dL   RDW 16.0 (H) 11.5 - 15.5 %   Platelets 174 150 - 400 K/uL   Neutrophils Relative % 72 %   Neutro Abs 5.6 1.7 - 7.7 K/uL   Lymphocytes Relative 17 %   Lymphs Abs 1.3 0.7 - 4.0 K/uL   Monocytes Relative 8 %   Monocytes Absolute 0.6 0.1 - 1.0 K/uL   Eosinophils Relative 3 %   Eosinophils Absolute 0.2 0.0 - 0.7 K/uL   Basophils Relative 0 %   Basophils Absolute 0.0 0.0 - 0.1 K/uL  Protime-INR     Status: Abnormal   Collection Time: 10/17/15  3:24 PM  Result Value Ref Range   Prothrombin Time 18.5 (H) 11.6 - 15.2 seconds   INR 1.53 (H) 0.00 - 1.49  Type  and screen     Status: None   Collection Time: 10/17/15  3:24 PM  Result Value Ref Range   ABO/RH(D) A POS    Antibody Screen NEG    Sample Expiration 10/20/2015   Digoxin level     Status: Abnormal   Collection Time: 10/17/15  3:24 PM  Result Value Ref Range   Digoxin Level <0.2 (L) 0.8 - 2.0 ng/mL    Comment: RESULTS CONFIRMED BY MANUAL DILUTION  Urinalysis, Routine w reflex microscopic (not at [facility])     Status: None   Collection Time: 10/17/15  8:44 PM  Result Value Ref Range   Color, Urine YELLOW YELLOW   APPearance CLEAR CLEAR   Specific Gravity, Urine 1.020 1.005 - 1.030   pH 5.0 5.0 - 8.0   Glucose, UA NEGATIVE NEGATIVE mg/dL   Hgb urine dipstick NEGATIVE NEGATIVE   Bilirubin Urine NEGATIVE NEGATIVE   Ketones, ur NEGATIVE NEGATIVE mg/dL   Protein, ur NEGATIVE NEGATIVE mg/dL   Nitrite NEGATIVE NEGATIVE   Leukocytes, UA NEGATIVE NEGATIVE    Comment: MICROSCOPIC NOT DONE ON URINES WITH NEGATIVE PROTEIN, BLOOD, LEUKOCYTES, NITRITE, OR  GLUCOSE <1000 mg/dL.  Basic metabolic panel     Status: Abnormal   Collection Time: 10/18/15  6:20 AM  Result Value Ref Range   Sodium 140 135 - 145 mmol/L   Potassium 4.1 3.5 - 5.1 mmol/L   Chloride 102 101 - 111 mmol/L   CO2 30 22 - 32 mmol/L   Glucose, Bld 95 65 - 99 mg/dL   BUN 22 (H) 6 - 20 mg/dL   Creatinine, Ser 0.88 0.44 - 1.00 mg/dL   Calcium 8.5 (L) 8.9 - 10.3 mg/dL   GFR calc non Af Amer >60 >60 mL/min   GFR calc Af Amer >60 >60 mL/min    Comment: (NOTE) The eGFR has been calculated using the CKD EPI equation. This calculation has not been validated in all clinical situations. eGFR's persistently <60 mL/min signify possible Chronic Kidney Disease.    Anion gap 8 5 - 15  CBC     Status: Abnormal   Collection Time: 10/18/15  6:20 AM  Result Value Ref Range   WBC 6.7 4.0 - 10.5 K/uL   RBC 4.69 3.87 - 5.11 MIL/uL   Hemoglobin 12.2 12.0 - 15.0 g/dL   HCT 39.0 36.0 - 46.0 %   MCV 83.2 78.0 - 100.0 fL   MCH 26.0 26.0 - 34.0 pg   MCHC 31.3 30.0 - 36.0 g/dL   RDW 16.1 (H) 11.5 - 15.5 %   Platelets 192 150 - 400 K/uL    ABGS No results for input(s): PHART, PO2ART, TCO2, HCO3 in the last 72 hours.  Invalid input(s): PCO2 CULTURES No results found for this or any previous visit (from the past 240 hour(s)). Studies/Results: Dg Chest 1 View  10/17/2015  CLINICAL DATA:  Pain.  Fall.  Initial encounter. EXAM: CHEST 1 VIEW COMPARISON:  09/30/2014 FINDINGS: The cardiac silhouette remains mildly enlarged. The lungs are hypoinflated with chronic diffuse interstitial prominence, not significantly changed. Slightly increased density is present in the lung bases compared to the prior study. No overt pulmonary edema, sizable pleural effusion, or pneumothorax is identified. No acute osseous abnormality is identified. IMPRESSION: New, mild bibasilar opacities, likely atelectasis. Electronically Signed   By: Logan Bores M.D.   On: 10/17/2015 16:10   Dg Knee 1-2 Views Left  10/17/2015   CLINICAL DATA:  [place] Friday. Lt hip pain. Pt states no  knee pain EXAM: LEFT KNEE - 1-2 VIEW COMPARISON:  None. FINDINGS: Small marginal spurs about the medial compartment. Narrowing of the articular cartilage. Negative for fracture or dislocation. No definite effusion. Patchy femoral-popliteal arterial calcifications. IMPRESSION: 1. Mild medial compartment degenerative spurring. No fracture or dislocation. Electronically Signed   By: Lucrezia Europe M.D.   On: 10/17/2015 16:11   Dg Hip Unilat With Pelvis 2-3 Views Left  10/17/2015  CLINICAL DATA:  She fell on Friday and landed on her lt side. She asked to come to the hospital all weekend and her husband refused to bring her. She did ambulate with a walker over the weekend. Hx of rt hip surgery about 1 yr ago. EXAM: DG HIP (WITH OR WITHOUT PELVIS) 2-3V LEFT COMPARISON:  09/30/2014 FINDINGS: Interval Previous sliding screw and plate fixation across the right femoral neck, hardware incompletely visualized. Acute comminuted left intertrochanteric fracture, impacted with a varus angulation deformity. Bony pelvis intact.  Patchy iliofemoral arterial calcifications. IMPRESSION: 1. Comminuted impacted left intertrochanteric femur fracture. Electronically Signed   By: Lucrezia Europe M.D.   On: 10/17/2015 16:13    Medications:  Prior to Admission:  Prescriptions prior to admission  Medication Sig Dispense Refill Last Dose  . apixaban (ELIQUIS) 5 MG TABS tablet Take 1 tablet (5 mg total) by mouth 2 (two) times daily. 60 tablet  10/17/2015 at Unknown time  . CALCIUM PO Take 2 tablets by mouth every morning.   10/17/2015 at Unknown time  . diltiazem (CARDIZEM CD) 120 MG 24 hr capsule Take 1 capsule (120 mg total) by mouth daily.   10/17/2015 at Unknown time  . donepezil (ARICEPT) 10 MG tablet Take 20 mg by mouth at bedtime.   10/16/2015 at Unknown time  . doxepin (SINEQUAN) 150 MG capsule Take 150 mg by mouth at bedtime.   11 10/16/2015 at Unknown time  . gabapentin (NEURONTIN)  400 MG capsule Take 400 mg by mouth 3 (three) times daily.    10/17/2015 at Unknown time  . HYDROcodone-acetaminophen (NORCO) 10-325 MG per tablet Take 1 tablet by mouth 4 (four) times daily.   10/17/2015 at Unknown time  . LORazepam (ATIVAN) 0.5 MG tablet Take 1 tablet (0.5 mg total) by mouth every 6 (six) hours as needed for anxiety. 30 tablet 0 10/16/2015 at Unknown time  . methocarbamol (ROBAXIN) 500 MG tablet Take 1 tablet (500 mg total) by mouth every 6 (six) hours as needed for muscle spasms.   10/16/2015 at Unknown time  . metoprolol (LOPRESSOR) 50 MG tablet Take 1 tablet (50 mg total) by mouth every 6 (six) hours. (Patient taking differently: Take 50 mg by mouth 2 (two) times daily. )   10/17/2015 at 900  . pravastatin (PRAVACHOL) 40 MG tablet Take 40 mg by mouth every evening.   10/16/2015 at Unknown time  . sertraline (ZOLOFT) 100 MG tablet Take 200 mg by mouth every morning.    10/17/2015 at Unknown time  . zolpidem (AMBIEN) 5 MG tablet Take 5 mg by mouth at bedtime as needed. For sleep  0 10/16/2015 at Unknown time  . albuterol (PROVENTIL) (2.5 MG/3ML) 0.083% nebulizer solution Take 3 mLs (2.5 mg total) by nebulization every 4 (four) hours as needed for wheezing or shortness of breath. (Patient not taking: Reported on 10/17/2015) 75 mL 12 Not Taking  . benazepril (LOTENSIN) 20 MG tablet Take 20 mg by mouth daily. Reported on 10/17/2015   Not Taking at Unknown time  . digoxin (LANOXIN) 0.125 MG tablet Take  1 tablet (0.125 mg total) by mouth daily. (Patient not taking: Reported on 10/17/2015)   Not Taking at Unknown time  . oxyCODONE-acetaminophen (PERCOCET/ROXICET) 5-325 MG per tablet Take 1 tablet by mouth every 4 (four) hours as needed for severe pain. (Patient not taking: Reported on 10/17/2015) 10 tablet 0 Completed Course at Unknown time   Scheduled: . benazepril  20 mg Oral Daily  . digoxin  0.125 mg Oral Daily  . diltiazem  120 mg Oral Daily  . donepezil  20 mg Oral QHS  . doxepin  150 mg  Oral QHS  . gabapentin  400 mg Oral TID  . HYDROcodone-acetaminophen  1 tablet Oral 4 times per day  . metoprolol  50 mg Oral BID  . pravastatin  40 mg Oral QPM  . senna  1 tablet Oral BID  . sertraline  200 mg Oral q morning - 10a   Continuous: . sodium chloride 50 mL/hr at 10/17/15 2247   PNS:[id number] **OR** acetaminophen, albuterol, LORazepam, methocarbamol, morphine injection, ondansetron **OR** ondansetron (ZOFRAN) IV, oxyCODONE, oxyCODONE-acetaminophen, traZODone  Assesment: She now has fallen and fractured both hips. She has a history of dementia. She has a history of atrial fibrillation and has been anticoagulated. I had initially resisted anticoagulating her because of multiple falls but when she went to the skilled care facility it was thought that she might be safe enough to not worry about that but now with another fall I think we may have to permanently discontinue her anticoagulation Principal Problem:   Hip fracture, left [facility]) Active Problems:   Essential hypertension   Atrial fibrillation with RVR ([place])   Fall at home   Dementia    Plan: Continue treatments for hip surgery later in the week    LOS: 1 day   Ariston Grandison L 10/18/2015, 9:05 AM

## 2015-10-19 ENCOUNTER — Inpatient Hospital Stay (HOSPITAL_COMMUNITY): Payer: Commercial Managed Care - HMO | Admitting: Anesthesiology

## 2015-10-19 ENCOUNTER — Encounter (HOSPITAL_COMMUNITY): Payer: Self-pay | Admitting: Anesthesiology

## 2015-10-19 ENCOUNTER — Encounter (HOSPITAL_COMMUNITY)
Admission: EM | Disposition: A | Discharge status: Skilled Nursing Facility | Payer: Self-pay | Source: Home / Self Care | Attending: Pulmonary Disease

## 2015-10-19 ENCOUNTER — Inpatient Hospital Stay (HOSPITAL_COMMUNITY): Payer: Commercial Managed Care - HMO

## 2015-10-19 DIAGNOSIS — S72142A Displaced intertrochanteric fracture of left femur, initial encounter for closed fracture: Secondary | ICD-10-CM | POA: Insufficient documentation

## 2015-10-19 HISTORY — PX: INTRAMEDULLARY (IM) NAIL INTERTROCHANTERIC: SHX5875

## 2015-10-19 LAB — HEMOGLOBIN AND HEMATOCRIT, BLOOD
HCT: 37.6 % (ref 36.0–46.0)
Hemoglobin: 11.7 g/dL — ABNORMAL LOW (ref 12.0–15.0)

## 2015-10-19 SURGERY — FIXATION, FRACTURE, INTERTROCHANTERIC, WITH INTRAMEDULLARY ROD
Anesthesia: Spinal | Site: Hip | Laterality: Left

## 2015-10-19 MED ORDER — FENTANYL CITRATE (PF) 100 MCG/2ML IJ SOLN
INTRAMUSCULAR | Status: DC | PRN
  Administered 2015-10-19: 25 ug via INTRATHECAL

## 2015-10-19 MED ORDER — SENNOSIDES-DOCUSATE SODIUM 8.6-50 MG PO TABS: 1.0000 | ORAL_TABLET | Freq: Every evening | ORAL | Status: DC | PRN

## 2015-10-19 MED ORDER — BUPIVACAINE-EPINEPHRINE (PF) 0.5% -1:200000 IJ SOLN
INTRAMUSCULAR | Status: AC
  Filled 2015-10-19: qty 60

## 2015-10-19 MED ORDER — PROPOFOL 500 MG/50ML IV EMUL
INTRAVENOUS | Status: DC | PRN
  Administered 2015-10-19: 25 ug/kg/min via INTRAVENOUS

## 2015-10-19 MED ORDER — SODIUM CHLORIDE 0.9 % IR SOLN
Status: DC | PRN
  Administered 2015-10-19: 2000 mL

## 2015-10-19 MED ORDER — FENTANYL CITRATE (PF) 100 MCG/2ML IJ SOLN
INTRAMUSCULAR | Status: AC
  Filled 2015-10-19: qty 2

## 2015-10-19 MED ORDER — BISACODYL 10 MG RE SUPP: 10.0000 mg | Freq: Every day | RECTAL | Status: DC | PRN

## 2015-10-19 MED ORDER — DOCUSATE SODIUM 100 MG PO CAPS
100.0000 mg | ORAL_CAPSULE | Freq: Two times a day (BID) | ORAL | Status: DC
  Administered 2015-10-19 – 2015-10-24 (×10): 100 mg via ORAL
  Filled 2015-10-19 (×10): qty 1

## 2015-10-19 MED ORDER — MAGNESIUM CITRATE PO SOLN: 1.0000 | Freq: Once | ORAL | Status: DC | PRN

## 2015-10-19 MED ORDER — FENTANYL CITRATE (PF) 100 MCG/2ML IJ SOLN: 25.0000 ug | INTRAMUSCULAR | Status: DC | PRN

## 2015-10-19 MED ORDER — BUPIVACAINE-EPINEPHRINE (PF) 0.5% -1:200000 IJ SOLN
INTRAMUSCULAR | Status: DC | PRN
  Administered 2015-10-19: 60 mL

## 2015-10-19 MED ORDER — ASPIRIN EC 325 MG PO TBEC
325.0000 mg | DELAYED_RELEASE_TABLET | Freq: Two times a day (BID) | ORAL | Status: DC
  Administered 2015-10-19 – 2015-10-24 (×10): 325 mg via ORAL
  Filled 2015-10-19 (×10): qty 1

## 2015-10-19 MED ORDER — ONDANSETRON HCL 4 MG/2ML IJ SOLN: 4.0000 mg | Freq: Once | INTRAMUSCULAR | Status: DC | PRN

## 2015-10-19 MED ORDER — VANCOMYCIN HCL IN DEXTROSE 1-5 GM/200ML-% IV SOLN
1000.0000 mg | Freq: Two times a day (BID) | INTRAVENOUS | Status: AC
Start: 2015-10-19 — End: 2015-10-19
  Administered 2015-10-19: 1000 mg via INTRAVENOUS
  Filled 2015-10-19: qty 200

## 2015-10-19 MED ORDER — BUPIVACAINE HCL (PF) 0.75 % IJ SOLN
INTRAMUSCULAR | Status: DC | PRN
  Administered 2015-10-19: 14 mg via INTRATHECAL

## 2015-10-19 MED ORDER — MIDAZOLAM HCL 5 MG/5ML IJ SOLN
INTRAMUSCULAR | Status: DC | PRN
  Administered 2015-10-19 (×4): 0.5 mg via INTRAVENOUS

## 2015-10-19 MED ORDER — MIDAZOLAM HCL 2 MG/2ML IJ SOLN
1.0000 mg | INTRAMUSCULAR | Status: DC | PRN
  Administered 2015-10-19: 2 mg via INTRAVENOUS

## 2015-10-19 MED ORDER — PHENYLEPHRINE HCL 10 MG/ML IJ SOLN
INTRAMUSCULAR | Status: DC | PRN
  Administered 2015-10-19 (×4): 80 ug via INTRAVENOUS
  Administered 2015-10-19: 40 ug via INTRAVENOUS
  Administered 2015-10-19: 80 ug via INTRAVENOUS
  Administered 2015-10-19 (×2): 40 ug via INTRAVENOUS
  Administered 2015-10-19 (×2): 80 ug via INTRAVENOUS
  Administered 2015-10-19: 40 ug via INTRAVENOUS
  Administered 2015-10-19: 80 ug via INTRAVENOUS

## 2015-10-19 MED ORDER — LACTATED RINGERS IV SOLN
INTRAVENOUS | Status: DC
  Administered 2015-10-19 (×2): via INTRAVENOUS

## 2015-10-19 MED ORDER — MIDAZOLAM HCL 2 MG/2ML IJ SOLN
INTRAMUSCULAR | Status: AC
  Filled 2015-10-19: qty 2

## 2015-10-19 MED ORDER — HYDROCODONE-ACETAMINOPHEN 5-325 MG PO TABS: 1.0000 | ORAL_TABLET | Freq: Four times a day (QID) | ORAL | Status: DC | PRN

## 2015-10-19 MED ORDER — EPHEDRINE SULFATE 50 MG/ML IJ SOLN
INTRAMUSCULAR | Status: DC | PRN
  Administered 2015-10-19 (×2): 5 mg via INTRAVENOUS
  Administered 2015-10-19: 10 mg via INTRAVENOUS
  Administered 2015-10-19: 5 mg via INTRAVENOUS
  Administered 2015-10-19 (×4): 10 mg via INTRAVENOUS

## 2015-10-19 MED ORDER — PHENOL 1.4 % MT LIQD: 1.0000 | OROMUCOSAL | Status: DC | PRN

## 2015-10-19 MED ORDER — EPHEDRINE SULFATE 50 MG/ML IJ SOLN
INTRAMUSCULAR | Status: AC
  Filled 2015-10-19: qty 1

## 2015-10-19 MED ORDER — MENTHOL 3 MG MT LOZG
1.0000 | LOZENGE | OROMUCOSAL | Status: DC | PRN
  Administered 2015-10-23: 3 mg via ORAL
  Filled 2015-10-19: qty 9

## 2015-10-19 MED ORDER — VANCOMYCIN HCL IN DEXTROSE 1-5 GM/200ML-% IV SOLN
1000.0000 mg | INTRAVENOUS | Status: AC
  Administered 2015-10-19: 1000 mg via INTRAVENOUS

## 2015-10-19 MED ORDER — METOCLOPRAMIDE HCL 10 MG PO TABS
5.0000 mg | ORAL_TABLET | Freq: Three times a day (TID) | ORAL | Status: DC | PRN
Start: 2015-10-19 — End: 2015-10-24
  Filled 2015-10-19: qty 2

## 2015-10-19 MED ORDER — DEXTROSE 5 % IV SOLN
INTRAVENOUS | Status: DC | PRN
  Administered 2015-10-19: 11:00:00 via INTRAVENOUS

## 2015-10-19 MED ORDER — FENTANYL CITRATE (PF) 100 MCG/2ML IJ SOLN
25.0000 ug | INTRAMUSCULAR | Status: AC
  Administered 2015-10-19 (×2): 25 ug via INTRAVENOUS

## 2015-10-19 MED ORDER — BUPIVACAINE IN DEXTROSE 0.75-8.25 % IT SOLN
INTRATHECAL | Status: AC
  Filled 2015-10-19: qty 2

## 2015-10-19 MED ORDER — PHENYLEPHRINE 40 MCG/ML (10ML) SYRINGE FOR IV PUSH (FOR BLOOD PRESSURE SUPPORT)
PREFILLED_SYRINGE | INTRAVENOUS | Status: AC
Start: 2015-10-19 — End: 2015-10-19
  Filled 2015-10-19: qty 10

## 2015-10-19 MED ORDER — SODIUM CHLORIDE 0.9 % IJ SOLN
INTRAMUSCULAR | Status: AC
  Filled 2015-10-19: qty 10

## 2015-10-19 MED ORDER — METOCLOPRAMIDE HCL 5 MG/ML IJ SOLN: 5.0000 mg | Freq: Three times a day (TID) | INTRAMUSCULAR | Status: DC | PRN

## 2015-10-19 MED ORDER — POTASSIUM CHLORIDE IN NACL 20-0.9 MEQ/L-% IV SOLN
INTRAVENOUS | Status: DC
  Administered 2015-10-19 – 2015-10-20 (×2): via INTRAVENOUS

## 2015-10-19 SURGICAL SUPPLY — 54 items
BAG HAMPER (MISCELLANEOUS) ×3 IMPLANT
BIT DRILL AO GAMMA 4.2X300 (BIT) ×3 IMPLANT
BLADE HEX COATED 2.75 (ELECTRODE) ×3 IMPLANT
BLADE SURG SZ10 CARB STEEL (BLADE) ×6 IMPLANT
BNDG GAUZE ELAST 4 BULKY (GAUZE/BANDAGES/DRESSINGS) ×3 IMPLANT
CHLORAPREP W/TINT 26ML (MISCELLANEOUS) ×3 IMPLANT
CLOTH BEACON ORANGE TIMEOUT ST (SAFETY) ×3 IMPLANT
COVER LIGHT HANDLE STERIS (MISCELLANEOUS) ×6 IMPLANT
COVER MAYO STAND XLG (DRAPE) ×3 IMPLANT
DECANTER SPIKE VIAL GLASS SM (MISCELLANEOUS) ×6 IMPLANT
DRAPE STERI IOBAN 125X83 (DRAPES) ×3 IMPLANT
DRSG AQUACEL AG ADV 3.5X10 (GAUZE/BANDAGES/DRESSINGS) IMPLANT
DRSG MEPILEX BORDER 4X12 (GAUZE/BANDAGES/DRESSINGS) ×3 IMPLANT
ELECT REM PT RETURN 9FT ADLT (ELECTROSURGICAL) ×3
ELECTRODE REM PT RTRN 9FT ADLT (ELECTROSURGICAL) ×1 IMPLANT
GLOVE BIOGEL PI IND STRL 7.0 (GLOVE) ×3 IMPLANT
GLOVE BIOGEL PI INDICATOR 7.0 (GLOVE) ×6
GLOVE SKINSENSE NS SZ8.0 LF (GLOVE) ×2
GLOVE SKINSENSE STRL SZ8.0 LF (GLOVE) ×1 IMPLANT
GLOVE SS N UNI LF 8.5 STRL (GLOVE) ×3 IMPLANT
GOWN STRL REUS W/TWL LRG LVL3 (GOWN DISPOSABLE) ×9 IMPLANT
GOWN STRL REUS W/TWL XL LVL3 (GOWN DISPOSABLE) ×3 IMPLANT
GUIDEROD T2 3X1000 (ROD) ×3 IMPLANT
INST SET MAJOR BONE (KITS) ×3 IMPLANT
K-WIRE  3.2X450M STR (WIRE) ×2
K-WIRE 3.2X450M STR (WIRE) ×1
KIT BLADEGUARD II DBL (SET/KITS/TRAYS/PACK) ×3 IMPLANT
KIT ROOM TURNOVER AP CYSTO (KITS) ×3 IMPLANT
KWIRE 3.2X450M STR (WIRE) ×1 IMPLANT
MANIFOLD NEPTUNE II (INSTRUMENTS) ×3 IMPLANT
MARKER SKIN DUAL TIP RULER LAB (MISCELLANEOUS) ×3 IMPLANT
NAIL TROCH GAMMA 11X18 (Nail) ×3 IMPLANT
NEEDLE HYPO 21X1.5 SAFETY (NEEDLE) ×3 IMPLANT
NEEDLE SPNL 18GX3.5 QUINCKE PK (NEEDLE) ×3 IMPLANT
NS IRRIG 1000ML POUR BTL (IV SOLUTION) ×3 IMPLANT
PACK BASIC III (CUSTOM PROCEDURE TRAY) ×2
PACK SRG BSC III STRL LF ECLPS (CUSTOM PROCEDURE TRAY) ×1 IMPLANT
PENCIL HANDSWITCHING (ELECTRODE) ×3 IMPLANT
SCREW LAG GAMMA 3 TI 10.5X90MM (Screw) ×3 IMPLANT
SCREW LOCKING T2 F/T  5X37.5MM (Screw) ×2 IMPLANT
SCREW LOCKING T2 F/T 5X37.5MM (Screw) ×1 IMPLANT
SET BASIN LINEN APH (SET/KITS/TRAYS/PACK) ×3 IMPLANT
SLING ARM FOAM STRAP LRG (SOFTGOODS) IMPLANT
SLING ARM FOAM STRAP MED (SOFTGOODS) ×3 IMPLANT
SLING ARM FOAM STRAP XLG (SOFTGOODS) IMPLANT
SPONGE LAP 18X18 X RAY DECT (DISPOSABLE) ×6 IMPLANT
STAPLER VISISTAT 35W (STAPLE) IMPLANT
SUT MNCRL 0 VIOLET CTX 36 (SUTURE) ×1 IMPLANT
SUT MON AB 0 CT1 (SUTURE) ×3 IMPLANT
SUT MON AB 2-0 CT1 36 (SUTURE) IMPLANT
SUT MONOCRYL 0 CTX 36 (SUTURE) ×2
SYR 30ML LL (SYRINGE) ×3 IMPLANT
SYR BULB IRRIGATION 50ML (SYRINGE) ×6 IMPLANT
YANKAUER SUCT BULB TIP NO VENT (SUCTIONS) ×3 IMPLANT

## 2015-10-19 NOTE — Anesthesia Procedure Notes (Addendum)
Date/Time: 10/19/2015 10:07 AM Performed by: Andree Elk, AMY A Pre-anesthesia Checklist: Patient identified, Timeout performed, Emergency Drugs available, Suction available and Patient being monitored Oxygen Delivery Method: Simple face mask   Spinal Patient location during procedure: OR Start time: 10/19/2015 10:46 AM Staffing Anesthesiologist: Lerry Liner Resident/CRNA: ADAMS, AMY A Preanesthetic Checklist Completed: patient identified, site marked, surgical consent, pre-op evaluation, timeout performed, IV checked, risks and benefits discussed and monitors and equipment checked Spinal Block Patient position: left lateral decubitus Prep: Betadine Patient monitoring: heart rate, cardiac monitor, continuous pulse ox and blood pressure Approach: left paramedian Location: L3-4 Injection technique: single-shot Needle Needle type: Spinocan  Needle gauge: 22 G Needle length: 9 cm Assessment Sensory level: T8 Additional Notes  ATTEMPTS:2 by me, Placed by Dr. Duwayne Heck 1 attempt [id number] [place]

## 2015-10-19 NOTE — Op Note (Signed)
Details of procedure  Site marking was performed in the preop area patient's chart was updated and reviewed. Patient was taken to surgery for spinal anesthesia. She was placed on fracture table. The right leg was padded and placed in a well leg holder the left leg was placed in traction  The C-arm was brought in. Reduction was performed with traction and internal rotation until stable reduction was obtained with an intact posterior medial buttress I decided to use a short nail. The films were taken in multiple planes.  The leg was prepped and draped sterilely timeout was completed all were in agreement for internal fixation left hip  The incision was made at the greater trochanter extended proximally approximately 5 cm. Subcutaneous tissue was divided. The incision was taken down to the fascia which was divided in line with the skin incision. The greater trochanter was visualized palpated and a curved awl was placed under C-arm guidance into the center of the femoral canal.  A long guidewire was placed down to the knee. The curved awl was removed, the 125 nail was passed over the guidewire the guidewire was taken out.  The implant was seated and a second incision was made along the lateral femur the cannula was advanced down to bone the threaded tip guidewire was placed in the center the femoral head on the AP and lateral C-arm x-ray  This measured 90 mm. The reamer was set for 90 mm and passed over the guidewire. The lag screw was placed over the guidewire. The proximal portion of the implantation guide was irrigated suctioned free and the acorn was passed and placed in sliding mode. Distal toggling of the lag screw handle confirmed engagement. The threaded tip guidewire was removed.  A third stab wound was made and the cannula was advanced down to bone for the distal locking screw. Appropriate drill was used to drill the distal screw hole which measured 87.5 mm. A 37.5 mm screw was passed.  Position was confirmed by x-ray  All wounds were irrigated thoroughly. Proximal incision was closed with 0 Monocryl and the fascial layer and in the subcutaneous layer. 60 mL of Marcaine was injected subfascially prior to subcutaneous closure.  Skin was approximated with staples  The second incision was closed with 0 Monocryl suture and staples in a third incision was closed with staples  Limb alignment was checked prior to placing the patient on a regular bed, it was found to be normal. she was taken to the recovery room in stable condition   Postoperative plan The patient can be mobilized tomorrow with full weightbearing as tolerated She can be restarted on eliquis in 48 hours We can start aspirin 1 twice a day tomorrow for total of 28 days for DVT prophylaxis Staples will come out postop day 12 Follow-up x-rays will be done on postop day 28, week 8 and week 12 and then until fracture healing every 4 weeks

## 2015-10-19 NOTE — Transfer of Care (Signed)
Immediate Anesthesia Transfer of Care Note  Patient: Tami [name]  Procedure(s) Performed: Procedure(s): INTERNAL FIXATION LEFT HIP (Left)  Patient Location: PACU  Anesthesia Type:Spinal  Level of Consciousness: awake, alert , oriented and patient cooperative  Airway & Oxygen Therapy: Patient Spontanous Breathing and Patient connected to face mask oxygen  Post-op Assessment: Report given to RN and Post -op Vital signs reviewed and stable  Post vital signs: Reviewed and stable  Last Vitals:  Filed Vitals:   10/19/15 1000 10/19/15 1005  BP: 88/51 96/55  Pulse:    Temp:    Resp: 7 13    Complications: No apparent anesthesia complications

## 2015-10-19 NOTE — Anesthesia Preprocedure Evaluation (Signed)
Anesthesia Evaluation  Patient identified by MRN, date of birth, ID band Patient awake    Reviewed: Allergy & Precautions, NPO status , Patient's Chart, lab work & pertinent test results, reviewed documented beta blocker date and time   Airway   TM Distance: >3 FB Neck ROM: Full    Dental  (+) Edentulous Upper, Edentulous Lower   Pulmonary former smoker,    breath sounds clear to auscultation       Cardiovascular Exercise Tolerance: Poor hypertension, Pt. on medications + dysrhythmias Atrial Fibrillation  Rhythm:Regular Rate:Bradycardia     Neuro/Psych PSYCHIATRIC DISORDERS (dementia) Anxiety Depression CVA, Residual Symptoms    GI/Hepatic GERD  Controlled,  Endo/Other    Renal/GU      Musculoskeletal  (+) Arthritis , Osteoarthritis,    Abdominal (+) + obese,  Abdomen: soft.    Peds  Hematology   Anesthesia Other Findings   Reproductive/Obstetrics                             Anesthesia Physical Anesthesia Plan  ASA: III  Anesthesia Plan: Spinal   Post-op Pain Management:    Induction:   Airway Management Planned: Simple Face Mask  Additional Equipment:   Intra-op Plan:   Post-operative Plan:   Informed Consent: I have reviewed the patients History and Physical, chart, labs and discussed the procedure including the risks, benefits and alternatives for the proposed anesthesia with the patient or authorized representative who has indicated his/her understanding and acceptance.     Plan Discussed with: CRNA, Anesthesiologist and Surgeon  Anesthesia Plan Comments:         Anesthesia Quick Evaluation

## 2015-10-19 NOTE — Anesthesia Postprocedure Evaluation (Signed)
Anesthesia Post Note  Patient: Tami [name]  Procedure(s) Performed: Procedure(s) (LRB): INTERNAL FIXATION LEFT HIP (Left)  Patient location during evaluation: PACU Anesthesia Type: Spinal Level of consciousness: awake and alert Pain management: pain level controlled Vital Signs Assessment: post-procedure vital signs reviewed and stable Respiratory status: spontaneous breathing and patient connected to nasal cannula oxygen Cardiovascular status: blood pressure returned to baseline Postop Assessment: no signs of nausea or vomiting Anesthetic complications: no    Last Vitals:  Filed Vitals:   10/19/15 1245 10/19/15 1300  BP: 96/72 94/64  Pulse: 58 82  Temp:    Resp: 14 12    Last Pain:  Filed Vitals:   10/19/15 1306  PainSc: 0-No pain                 Jerelle Virden

## 2015-10-19 NOTE — Plan of Care (Signed)
Pt to surgery via bed. Ruben Im SN [facility]

## 2015-10-19 NOTE — Plan of Care (Signed)
Report taken. Assume care of pt. Quentina Fronek SN [facility] 

## 2015-10-19 NOTE — Brief Op Note (Signed)
10/17/2015 - 10/19/2015  12:06 PM  PATIENT:  Tami [name]  75 y.o. female  PRE-OPERATIVE DIAGNOSIS:  left hip fracture intertrochanteric  POST-OPERATIVE DIAGNOSIS:  left hip fracture intertrochanteric  Surgical findings four-part intertrochanteric fracture which reduced well with a stable posterior medial buttress  Implants 125 short gamma nail 37.5 distal locking screw 90 mm lag screw, acorn was placed with lag screw and sliding mode  Assisted by [name]  Spinal anesthesia  Blood loss estimated 125 mL  60 mL of Marcaine with epinephrine injected post procedure.  No complications were noted. Sponge and needle counts were correct  PROCEDURE:  Procedure(s): INTERNAL FIXATION LEFT HIP (Left)  SURGEON:  Surgeon(s) and Role:    * Carole Civil, MD - Primary  EBL:  Total I/O In: 1800 [I.V.:1800] Out: 250 [Urine:200; Blood:50]  SPECIMEN:  No Specimen  DISPOSITION OF SPECIMEN:  N/A  COUNTS:  YES  TOURNIQUET:  * No tourniquets in log *  DICTATION: .Dragon Dictation  PLAN OF CARE: Admit to inpatient   PATIENT DISPOSITION:  PACU - hemodynamically stable.   Delay start of Pharmacological VTE agent (>24hrs) due to surgical blood loss or risk of bleeding: yes

## 2015-10-20 ENCOUNTER — Inpatient Hospital Stay (HOSPITAL_COMMUNITY): Payer: Commercial Managed Care - HMO

## 2015-10-20 ENCOUNTER — Encounter (HOSPITAL_COMMUNITY): Payer: Self-pay | Admitting: Orthopedic Surgery

## 2015-10-20 DIAGNOSIS — F0390 Unspecified dementia without behavioral disturbance: Secondary | ICD-10-CM

## 2015-10-20 DIAGNOSIS — I4891 Unspecified atrial fibrillation: Secondary | ICD-10-CM

## 2015-10-20 DIAGNOSIS — I9589 Other hypotension: Secondary | ICD-10-CM

## 2015-10-20 DIAGNOSIS — I10 Essential (primary) hypertension: Secondary | ICD-10-CM

## 2015-10-20 LAB — BASIC METABOLIC PANEL
Anion gap: 3 — ABNORMAL LOW (ref 5–15)
BUN: 17 mg/dL (ref 6–20)
CHLORIDE: 109 mmol/L (ref 101–111)
CO2: 30 mmol/L (ref 22–32)
CREATININE: 0.78 mg/dL (ref 0.44–1.00)
Calcium: 7.7 mg/dL — ABNORMAL LOW (ref 8.9–10.3)
GFR calc Af Amer: >60 mL/min (ref >60)
GFR calc non Af Amer: >60 mL/min (ref >60)
GLUCOSE: 86 mg/dL (ref 65–99)
Potassium: 5.1 mmol/L (ref 3.5–5.1)
SODIUM: 142 mmol/L (ref 135–145)

## 2015-10-20 LAB — CBC
HCT: 34.5 % — ABNORMAL LOW (ref 36.0–46.0)
HEMOGLOBIN: 10.8 g/dL — AB (ref 12.0–15.0)
MCH: 26.3 pg (ref 26.0–34.0)
MCHC: 31.3 g/dL (ref 30.0–36.0)
MCV: 83.9 fL (ref 78.0–100.0)
Platelets: 162 10*3/uL (ref 150–400)
RBC: 4.11 MIL/uL (ref 3.87–5.11)
RDW: 15.9 % — ABNORMAL HIGH (ref 11.5–15.5)
WBC: 5.9 10*3/uL (ref 4.0–10.5)

## 2015-10-20 LAB — TROPONIN I
Troponin I: 0.06 ng/mL — ABNORMAL HIGH (ref <0.031)
Troponin I: 0.05 ng/mL — ABNORMAL HIGH (ref <0.031)
Troponin I: 0.07 ng/mL — ABNORMAL HIGH (ref <0.031)

## 2015-10-20 MED ORDER — OXYCODONE-ACETAMINOPHEN 5-325 MG PO TABS
1.0000 | ORAL_TABLET | ORAL | Status: DC | PRN
  Administered 2015-10-20 – 2015-10-23 (×5): 1 via ORAL
  Filled 2015-10-20 (×5): qty 1

## 2015-10-20 MED ORDER — SODIUM CHLORIDE 0.9 % IV BOLUS (SEPSIS)
500.0000 mL | Freq: Once | INTRAVENOUS | Status: AC
  Administered 2015-10-20: 500 mL via INTRAVENOUS

## 2015-10-20 MED ORDER — AMIODARONE HCL IN DEXTROSE 360-4.14 MG/200ML-% IV SOLN
30.0000 mg/h | INTRAVENOUS | Status: DC
  Administered 2015-10-20 – 2015-10-21 (×3): 30 mg/h via INTRAVENOUS
  Filled 2015-10-20 (×3): qty 200

## 2015-10-20 MED ORDER — AMIODARONE HCL IN DEXTROSE 360-4.14 MG/200ML-% IV SOLN
60.0000 mg/h | INTRAVENOUS | Status: DC
  Administered 2015-10-20: 60 mg/h via INTRAVENOUS
  Filled 2015-10-20: qty 200

## 2015-10-20 MED ORDER — PHENYLEPHRINE HCL 10 MG/ML IJ SOLN
30.0000 ug/min | INTRAMUSCULAR | Status: DC
  Administered 2015-10-20 (×2): 30 ug/min via INTRAVENOUS
  Filled 2015-10-20 (×2): qty 1

## 2015-10-20 MED ORDER — SODIUM CHLORIDE 0.9 % IV SOLN
INTRAVENOUS | Status: DC
  Administered 2015-10-20 – 2015-10-23 (×8): via INTRAVENOUS
  Administered 2015-10-23: 1000 mL via INTRAVENOUS
  Administered 2015-10-24: 10:00:00 via INTRAVENOUS

## 2015-10-20 MED ORDER — DIGOXIN 125 MCG PO TABS
0.2500 mg | ORAL_TABLET | Freq: Every day | ORAL | Status: DC
  Administered 2015-10-21 – 2015-10-24 (×4): 0.25 mg via ORAL
  Filled 2015-10-20 (×4): qty 2

## 2015-10-20 MED ORDER — PHENYLEPHRINE HCL 10 MG/ML IJ SOLN
INTRAMUSCULAR | Status: AC
  Filled 2015-10-20: qty 1

## 2015-10-20 MED ORDER — SODIUM CHLORIDE 0.45 % IV BOLUS
500.0000 mL | Freq: Once | INTRAVENOUS | Status: AC
  Administered 2015-10-20: 500 mL via INTRAVENOUS

## 2015-10-20 MED ORDER — AMIODARONE LOAD VIA INFUSION
150.0000 mg | Freq: Once | INTRAVENOUS | Status: AC
  Administered 2015-10-20: 150 mg via INTRAVENOUS
  Filled 2015-10-20: qty 83.34

## 2015-10-20 NOTE — Progress Notes (Signed)
Subjective: Events of last night noted. She is now on amiodarone and Neo-Synephrine. She is awake and alert. She says she is having some pain in her surgical site.  Objective: Vital signs in last 24 hours: Temp:  [97 F (36.1 C)-98.8 F (37.1 C)] 98.3 F (36.8 C) (02/23 0757) Pulse Rate:  [28-156] 101 (02/23 0825) Resp:  [6-30] 26 (02/23 0825) BP: (56-189)/(42-153) 100/69 mmHg (02/23 0825) SpO2:  [76 %-100 %] 95 % (02/23 0825) Weight:  [71.215 kg (157 lb)-78 kg (171 lb 15.3 oz)] 78 kg (171 lb 15.3 oz) (02/23 0500) Weight change:  Last BM Date: 10/17/15  Intake/Output from previous day: 02/22 0701 - 02/23 0700 In: 5447.3 [I.V.:4447.3; IV Piggyback:1000] Out: 6568 [Urine:1325; Blood:50]  PHYSICAL EXAM General appearance: alert, cooperative and no distress Resp: clear to auscultation bilaterally Cardio: irregularly irregular rhythm GI: soft, non-tender; bowel sounds normal; no masses,  no organomegaly Extremities: extremities normal, atraumatic, no cyanosis or edema  Lab Results:  Results for orders placed or performed during the hospital encounter of 10/17/15 (from the past 48 hour(s))  Prepare RBC     Status: None   Collection Time: 10/18/15 10:13 AM  Result Value Ref Range   Order Confirmation ORDER PROCESSED BY BLOOD BANK   Hemoglobin and hematocrit, blood     Status: Abnormal   Collection Time: 10/19/15 12:30 PM  Result Value Ref Range   Hemoglobin 11.7 (L) 12.0 - 15.0 g/dL   HCT 37.6 36.0 - 46.0 %  CBC     Status: Abnormal   Collection Time: 10/20/15  4:42 AM  Result Value Ref Range   WBC 5.9 4.0 - 10.5 K/uL   RBC 4.11 3.87 - 5.11 MIL/uL   Hemoglobin 10.8 (L) 12.0 - 15.0 g/dL   HCT 34.5 (L) 36.0 - 46.0 %   MCV 83.9 78.0 - 100.0 fL   MCH 26.3 26.0 - 34.0 pg   MCHC 31.3 30.0 - 36.0 g/dL   RDW 15.9 (H) 11.5 - 15.5 %   Platelets 162 150 - 400 K/uL  Basic metabolic panel     Status: Abnormal   Collection Time: 10/20/15  4:42 AM  Result Value Ref Range   Sodium  142 135 - 145 mmol/L   Potassium 5.1 3.5 - 5.1 mmol/L    Comment: DELTA CHECK NOTED   Chloride 109 101 - 111 mmol/L   CO2 30 22 - 32 mmol/L   Glucose, Bld 86 65 - 99 mg/dL   BUN 17 6 - 20 mg/dL   Creatinine, Ser 0.78 0.44 - 1.00 mg/dL   Calcium 7.7 (L) 8.9 - 10.3 mg/dL   GFR calc non Af Amer >60 >60 mL/min   GFR calc Af Amer >60 >60 mL/min    Comment: (NOTE) The eGFR has been calculated using the CKD EPI equation. This calculation has not been validated in all clinical situations. eGFR's persistently <60 mL/min signify possible Chronic Kidney Disease.    Anion gap 3 (L) 5 - 15    ABGS No results for input(s): PHART, PO2ART, TCO2, HCO3 in the last 72 hours.  Invalid input(s): PCO2 CULTURES Recent Results (from the past 240 hour(s))  Surgical PCR screen     Status: Abnormal   Collection Time: 10/18/15  5:00 AM  Result Value Ref Range Status   MRSA, PCR NEGATIVE NEGATIVE Final   Staphylococcus aureus POSITIVE (A) NEGATIVE Final    Comment:        The Xpert SA Assay (FDA approved for NASAL specimens  in patients over 55 years of age), is one component of a comprehensive surveillance program.  Test performance has been validated by [facility] for patients greater than or equal to 53 year old. It is not intended to diagnose infection nor to guide or monitor treatment. RESULT CALLED TO, READ BACK BY AND VERIFIED WITH: EVERETT,R. AT 1749 ON 10/18/2015 BY BAUGHAM,M.    Studies/Results: Dg Hip Operative Unilat With Pelvis Left  10/19/2015  CLINICAL DATA:  Open reduction internal fixation left hip EXAM: OPERATIVE left HIP (WITH PELVIS IF PERFORMED) 10 VIEWS TECHNIQUE: Fluoroscopic spot image(s) were submitted for interpretation post-operatively. COMPARISON:  None. FINDINGS: The patient is status post open reduction internal fixation of left intertrochanteric femoral fracture. There is intra medullary rod in proximal left femur. Metallic locking pin and noted along the left  femoral neck. There is anatomic alignment. A locking screw is noted in proximal shaft of left femur. Fluoroscopy time was 1 minutes 36 seconds. Please see the operative report. IMPRESSION: Status post open reduction internal fixation of left intertrochanteric femoral fracture. Intra medullary rod and metallic fixation pin noted noted in proximal left femur. There is anatomic alignment. Please see the operative report. Electronically Signed   By: Lahoma Crocker M.D.   On: 10/19/2015 12:35    Medications:  Prior to Admission:  Prescriptions prior to admission  Medication Sig Dispense Refill Last Dose  . apixaban (ELIQUIS) 5 MG TABS tablet Take 1 tablet (5 mg total) by mouth 2 (two) times daily. 60 tablet  10/17/2015 at Unknown time  . CALCIUM PO Take 2 tablets by mouth every morning.   10/17/2015 at Unknown time  . diltiazem (CARDIZEM CD) 120 MG 24 hr capsule Take 1 capsule (120 mg total) by mouth daily.   10/17/2015 at Unknown time  . donepezil (ARICEPT) 10 MG tablet Take 20 mg by mouth at bedtime.   10/16/2015 at Unknown time  . doxepin (SINEQUAN) 150 MG capsule Take 150 mg by mouth at bedtime.   11 10/16/2015 at Unknown time  . gabapentin (NEURONTIN) 400 MG capsule Take 400 mg by mouth 3 (three) times daily.    10/17/2015 at Unknown time  . HYDROcodone-acetaminophen (NORCO) 10-325 MG per tablet Take 1 tablet by mouth 4 (four) times daily.   10/17/2015 at Unknown time  . LORazepam (ATIVAN) 0.5 MG tablet Take 1 tablet (0.5 mg total) by mouth every 6 (six) hours as needed for anxiety. 30 tablet 0 10/16/2015 at Unknown time  . methocarbamol (ROBAXIN) 500 MG tablet Take 1 tablet (500 mg total) by mouth every 6 (six) hours as needed for muscle spasms.   10/16/2015 at Unknown time  . metoprolol (LOPRESSOR) 50 MG tablet Take 1 tablet (50 mg total) by mouth every 6 (six) hours. (Patient taking differently: Take 50 mg by mouth 2 (two) times daily. )   10/17/2015 at 900  . pravastatin (PRAVACHOL) 40 MG tablet Take 40 mg by  mouth every evening.   10/16/2015 at Unknown time  . sertraline (ZOLOFT) 100 MG tablet Take 200 mg by mouth every morning.    10/17/2015 at Unknown time  . zolpidem (AMBIEN) 5 MG tablet Take 5 mg by mouth at bedtime as needed. For sleep  0 10/16/2015 at Unknown time  . albuterol (PROVENTIL) (2.5 MG/3ML) 0.083% nebulizer solution Take 3 mLs (2.5 mg total) by nebulization every 4 (four) hours as needed for wheezing or shortness of breath. (Patient not taking: Reported on 10/17/2015) 75 mL 12 Not Taking  . benazepril (  LOTENSIN) 20 MG tablet Take 20 mg by mouth daily. Reported on 10/17/2015   Not Taking at Unknown time  . digoxin (LANOXIN) 0.125 MG tablet Take 1 tablet (0.125 mg total) by mouth daily. (Patient not taking: Reported on 10/17/2015)   Not Taking at Unknown time  . oxyCODONE-acetaminophen (PERCOCET/ROXICET) 5-325 MG per tablet Take 1 tablet by mouth every 4 (four) hours as needed for severe pain. (Patient not taking: Reported on 10/17/2015) 10 tablet 0 Completed Course at Unknown time   Scheduled: . aspirin EC  325 mg Oral BID  . Chlorhexidine Gluconate Cloth  6 each Topical Daily  . digoxin  0.125 mg Oral Daily  . diltiazem  120 mg Oral Daily  . docusate sodium  100 mg Oral BID  . donepezil  20 mg Oral QHS  . doxepin  150 mg Oral QHS  . gabapentin  400 mg Oral TID  . HYDROcodone-acetaminophen  1 tablet Oral 4 times per day  . metoprolol  50 mg Oral BID  . mupirocin ointment  1 application Nasal BID  . pravastatin  40 mg Oral QPM  . senna  1 tablet Oral BID  . sertraline  200 mg Oral q morning - 10a   Continuous: . 0.9 % NaCl with KCl 20 mEq / L 125 mL/hr at 10/20/15 3833  . amiodarone 60 mg/hr (10/20/15 0600)   Followed by  . amiodarone 30 mg/hr (10/20/15 0822)  . phenylephrine (NEO-SYNEPHRINE) Adult infusion 35 mcg/min (10/20/15 0630)   XOV:ANVBTYOMA, bisacodyl, HYDROcodone-acetaminophen, LORazepam, magnesium citrate, menthol-cetylpyridinium **OR** phenol, methocarbamol,  metoCLOPramide **OR** metoCLOPramide (REGLAN) injection, morphine injection, ondansetron **OR** ondansetron (ZOFRAN) IV, senna-docusate, traZODone  Assesment: She has a history of a left intertrochanteric fracture. She had repair yesterday. Postoperatively she has developed atrial fibrillation with rapid ventricular response and hypotension. She is on amiodarone and Neo-Synephrine. She has received fluid boluses. Her atrial fibrillation has been relatively hard to control at baseline. She has dementia at baseline and lives in a nursing home. She had been anticoagulated since she was in a nursing home but after this fall we may need to reconsider that. Principal Problem:   Hip fracture, left [facility]) Active Problems:   Essential hypertension   Atrial fibrillation with RVR (HCC)   Fall at home   Dementia   Intertrochanteric fracture of left hip (HCC)   Intertrochanteric fracture of left femur (HCC)    Plan: Place PICC line. Continue with current treatments. Check troponin. Check electrolytes. Check CBC. Cardiology consultation    LOS: 3 days   Helia Haese L 10/20/2015, 8:40 AM

## 2015-10-20 NOTE — Progress Notes (Signed)
[place] Progress Note Patient Name: Tami [name] DOB: June 21, 1941 MRN: [id number]   Date of Service  10/20/2015  HPI/Events of Note  Pt continues to be hypotensive. Rapid afib to 120s  eICU Interventions  Amiodarone drip, hold cardizem and lopressor. One more bolus of 500cc NS     Intervention Category Intermediate Interventions: Hypotension - evaluation and management  Cordaryl Decelles 10/20/2015, 4:19 AM

## 2015-10-20 NOTE — Progress Notes (Addendum)
Subjective: Post op day 1 left gamma nail to icu for BP    Objective: Vital signs in last 24 hours: Temp:  [97 F (36.1 C)-98.8 F (37.1 C)] 98.3 F (36.8 C) (02/23 0757) Pulse Rate:  [28-156] 62 (02/23 0845) Resp:  [6-30] 14 (02/23 0845) BP: (56-189)/(41-153) 110/64 mmHg (02/23 0845) SpO2:  [76 %-100 %] 82 % (02/23 0845) Weight:  [171 lb 15.3 oz (78 kg)] 171 lb 15.3 oz (78 kg) (02/23 0500)  Intake/Output from previous day: 02/22 0701 - 02/23 0700 In: 5447.3 [I.V.:4447.3; IV Piggyback:1000] Out: [id number] [Urine:1325; Blood:50] Intake/Output this shift:     Recent Labs  10/17/15 1524 10/18/15 0620 10/19/15 1230 10/20/15 0442  HGB 13.2 12.2 11.7* 10.8*    Recent Labs  10/18/15 0620 10/19/15 1230 10/20/15 0442  WBC 6.7  --  5.9  RBC 4.69  --  4.11  HCT 39.0 37.6 34.5*  PLT 192  --  162    Recent Labs  10/18/15 0620 10/20/15 0442  NA 140 142  K 4.1 5.1  CL 102 109  CO2 30 30  BUN 22* 17  CREATININE 0.88 0.78  GLUCOSE 95 86  CALCIUM 8.5* 7.7*    Recent Labs  10/17/15 1524  INR 1.53*    stable  Assessment/Plan: PT if BP stable move to floor   change dressing tomorrow   Arther Abbott 10/20/2015, 9:43 AM

## 2015-10-20 NOTE — Progress Notes (Signed)
[place] Progress Note Patient Name: Tami [name] DOB: [birth year] MRN: [id number]   Date of Service  10/20/2015  HPI/Events of Note  Low BP 86/63  eICU Interventions  Fluid bolus 500cc NS     Intervention Category Intermediate Interventions: Hypotension - evaluation and management  Riggin Cuttino 10/20/2015, 2:21 AM

## 2015-10-20 NOTE — Progress Notes (Signed)
PT Cancellation Note  Patient Details Name: Tami [name] MRN: [id number] DOB: [birth year]   Cancelled Treatment:    Reason Eval/Treat Not Completed: Patient not medically ready. Chart reviewed, RN consulted. Holding pt treatment at this time due to most recent vitals assessment: BP: 77/26 mm Hg, and HR 119bpm. Pt is not safe for exertion at this time. Will attempt at later date/time as medically appropriate.      12:29 PM, 10/20/2015 Etta Grandchild, PT, DPT PRN Physical Therapist at [place] License # [id number] (wireless)  [phone] (mobile)

## 2015-10-20 NOTE — Addendum Note (Signed)
Addendum  created 10/20/15 1208 by Charmaine Downs, CRNA   Modules edited: Clinical Notes   Clinical Notes:  File: [id number]

## 2015-10-20 NOTE — Progress Notes (Signed)
[place] Progress Note Patient Name: Tami [name] DOB: August 03, 1941 MRN: [id number]   Date of Service  10/20/2015  HPI/Events of Note  Continues to be hypotensive SBP 60s  eICU Interventions  Start peripheral neo. Pt will need a central line.     Intervention Category Major Interventions: Hypotension - evaluation and management  Emilyrose Darrah 10/20/2015, 5:51 AM

## 2015-10-20 NOTE — Progress Notes (Signed)
OT Cancellation Note  Patient Details Name: Tami [name] MRN: [id number] DOB: 1940-09-05   Cancelled Treatment:     Reason evaluation not completed: Chart reviewed, spoke with pt who reports moderate pain in the left hip. Pt vitals continue to fluctuate with HR ranging from mid 80s to 130 while at rest, SpO2 ranging from 74-89 while speaking with pt, BP 89/40. Pt with mild difficulty answering questions and remaining on topic while speaking with OT.  Will hold OT evaluation until pt is stable and medically appropriate for services.   Guadelupe Sabin, OTR/L  [phone]  10/20/2015, 8:48 AM

## 2015-10-20 NOTE — Addendum Note (Signed)
Addendum  created 10/20/15 1210 by Charmaine Downs, CRNA   Modules edited: Clinical Notes   Clinical Notes:  File: [id number]

## 2015-10-20 NOTE — Anesthesia Postprocedure Evaluation (Signed)
Anesthesia Post Note  Patient: Tami [name]  Procedure(s) Performed: Procedure(s) (LRB): INTERNAL FIXATION LEFT HIP (Left)  Patient location during evaluation: ICU Anesthesia Type: Spinal Level of consciousness: confused and awake Pain management: pain level controlled Vital Signs Assessment: post-procedure vital signs reviewed and stable Respiratory status: spontaneous breathing and patient connected to nasal cannula oxygen Cardiovascular status: blood pressure returned to baseline Postop Assessment: adequate PO intake, no signs of nausea or vomiting, no headache and no backache Anesthetic complications: no    Last Vitals:  Filed Vitals:   10/20/15 1130 10/20/15 1145  BP: 93/61 77/26  Pulse: 112 128  Temp:    Resp: 19 23    Last Pain:  Filed Vitals:   10/20/15 1153  PainSc: Asleep                 Leni Pankonin J

## 2015-10-20 NOTE — Anesthesia Postprocedure Evaluation (Signed)
Anesthesia Post Note  Patient: Tami [name]  Procedure(s) Performed: Procedure(s) (LRB): INTERNAL FIXATION LEFT HIP (Left)  Patient location during evaluation: ICU Anesthesia Type: Spinal Level of consciousness: awake and confused Pain management: pain level controlled Vital Signs Assessment: post-procedure vital signs reviewed and stable Respiratory status: patient connected to nasal cannula oxygen Cardiovascular status: blood pressure returned to baseline Postop Assessment: no signs of nausea or vomiting and adequate PO intake Anesthetic complications: no    Last Vitals:  Filed Vitals:   10/20/15 1130 10/20/15 1145  BP: 93/61 77/26  Pulse: 112 128  Temp:    Resp: 19 23    Last Pain:  Filed Vitals:   10/20/15 1153  PainSc: Asleep                 Neta Upadhyay J

## 2015-10-20 NOTE — Consult Note (Signed)
CARDIOLOGY CONSULT NOTE   Patient ID: Tami [name] MRN: [id number] DOB/AGE: [birth year] 75 y.o.  Admit Date: 10/17/2015 Referring Physician: Sinda Du MD Primary Physician: Alonza Bogus, MD Consulting Cardiologist: Kate Sable MD Primary Cardiologist New Reason for Consultation: Afib with RVR  Clinical Summary Tami [name] is a 75 y.o.female with known history of atrial fib, CHADS VASC score 6, on Eliquis, and anxiety who presented to the ER after mechanical fall resulting in a left hip fracture. She had ORIF of left hip fracture on 10/19/2015. Post operatively she became hypotensive and heart rate became rapid with Afib RVR rates up to 120's She was given an IV fluid bolus of 500cc, . She was placed on amiodarone gtt and diltiazem was discontinued.  She was moved to ICU and is now on Neo-Synephrine gtt.  She is a poor historian and cannot give me information on who she has seen for atrial fib in the past or any other cardiac history. She is without complaints with the exception of feeling tired, and also having a scalp rash that is causing pruritis.   Currently on amiodarone at 30 mg/hour and neo-synephrine at 25 mcg's. Heart rate is in the 90's at rest.   Allergies  Allergen Reactions  . Penicillins Other (See Comments)    Child hood allergy(unknown reaction)    Medications Scheduled Medications: . aspirin EC  325 mg Oral BID  . Chlorhexidine Gluconate Cloth  6 each Topical Daily  . digoxin  0.125 mg Oral Daily  . diltiazem  120 mg Oral Daily  . docusate sodium  100 mg Oral BID  . donepezil  20 mg Oral QHS  . doxepin  150 mg Oral QHS  . gabapentin  400 mg Oral TID  . HYDROcodone-acetaminophen  1 tablet Oral 4 times per day  . metoprolol  50 mg Oral BID  . mupirocin ointment  1 application Nasal BID  . pravastatin  40 mg Oral QPM  . senna  1 tablet Oral BID  . sertraline  200 mg Oral q morning - 10a    Infusions: . 0.9 % NaCl with KCl 20 mEq / L 125  mL/hr at 10/20/15 [id number]  . amiodarone 60 mg/hr (10/20/15 0600)   Followed by  . amiodarone 30 mg/hr (10/20/15 0822)  . phenylephrine (NEO-SYNEPHRINE) Adult infusion 35 mcg/min (10/20/15 0630)    PRN Medications: albuterol, bisacodyl, HYDROcodone-acetaminophen, LORazepam, magnesium citrate, menthol-cetylpyridinium **OR** phenol, methocarbamol, metoCLOPramide **OR** metoCLOPramide (REGLAN) injection, morphine injection, ondansetron **OR** ondansetron (ZOFRAN) IV, senna-docusate, traZODone   Past Medical History  Diagnosis Date  . Arthritis   . Cancer (HCC) Cervical  . Hypertension   . Stroke ([place])   . Anxiety   . Dementia     Past Surgical History  Procedure Laterality Date  . Knee arthroscopy Right   . Femur closed reduction Right   . Appendectomy    . Cholecystectomy    . Brain surgery    . Tonsillectomy    . Orif hip fracture Right 10/08/2014    Procedure: OPEN REDUCTION INTERNAL FIXATION RIGHT HIP;  Surgeon: Sanjuana Kava, MD;  Location: [facility];  Service: Orthopedics;  Laterality: Right;    Family History  Problem Relation Age of Onset  . Stroke Mother     Social History Tami [name] reports that she has quit smoking. She has never used smokeless tobacco. Tami [name] reports that she does not drink alcohol.  Review of Systems Complete review of systems are found to be negative  unless outlined in H&P above.  Physical Examination Blood pressure 110/64, pulse 62, temperature 98.3 F (36.8 C), temperature source Oral, resp. rate 14, height 5\' 10"  (1.778 m), weight 171 lb 15.3 oz (78 kg), SpO2 82 %.  Intake/Output Summary (Last 24 hours) at 10/20/15 0923 Last data filed at 10/20/15 0600  Gross per 24 hour  Intake 5447.27 ml  Output   1375 ml  Net 4072.27 ml    Telemetry:Atrial fib, rates in the 90's   GEN: Ill appearing  HEENT: Conjunctiva and lids normal, oropharynx clear with moist mucosa. Neck: Supple, no elevated JVP or carotid bruits, no thyromegaly. Lungs:  Bilateral rales and rhonchi with frequent coughing.  Cardiac: Iregular rate and rhythm, tachycardic, 1/6 systolic murmur, no pericardial rub. Abdomen: Soft, nontender, no hepatomegaly, bowel sounds present, no guarding or rebound. Extremities: No pitting edema, distal pulses 2+. Skin: Warm and dry. Musculoskeletal: Positive for kyphosis, left hip dressing with bilateral SCD.  Neuropsychiatric: Alert poor memory,  affect grossly appropriate.  Prior Cardiac Testing/Procedures 1.Echo 10/02/2015 Procedure narrative: Transthoracic echocardiography. Image quality was suboptimal. The study was technically difficult, as a result of poor sound wave transmission, restricted patient mobility, and body habitus. - Left ventricle: The cavity size was normal. Systolic function was normal. The estimated ejection fraction was in the range of 60% to 65%. Images were inadequate for LV wall motion assessment. The study was not technically sufficient to allow evaluation of LV diastolic dysfunction due to atrial fibrillation. There was no evidence of elevated ventricular filling pressure by Doppler parameters. Moderate to severe concentric left ventricular hypertrophy. - Aortic valve: Mildly calcified annulus. Moderately thickened, mildly calcified leaflets. There was no stenosis. There was trivial regurgitation. Mean gradient (S): 3 mm Hg. - Aorta: Mild ascending aortic dilatation, maximum diameter 3.74 cm. Mild aortic root dilatation. Aortic root dimension: 42 mm (ED). - Mitral valve: Mildly calcified annulus. Normal thickness leaflets . There was mild to moderate regurgitation. - Left atrium: The atrium was moderately to severely dilated. - Right atrium: The atrium was mildly dilated. - Tricuspid valve: There was mild-moderate regurgitation. - Pulmonary arteries: PA peak pressure: 40 mm Hg (S). Mildly elevated pulmonary pressures. - Inferior vena cava: The vessel was  dilated. The respirophasic diameter changes were blunted (< 50%), consistent with elevated central venous pressure.  Lab Results  Basic Metabolic Panel:  Recent Labs Lab 10/17/15 1524 10/18/15 0620 10/20/15 0442  NA 141 140 142  K 4.2 4.1 5.1  CL 103 102 109  CO2 31 30 30   GLUCOSE 95 95 86  BUN 19 22* 17  CREATININE 0.82 0.88 0.78  CALCIUM 8.7* 8.5* 7.7*     CBC:  Recent Labs Lab 10/17/15 1524 10/18/15 0620 10/19/15 1230 10/20/15 0442  WBC 7.8 6.7  --  5.9  NEUTROABS 5.6  --   --   --   HGB 13.2 12.2 11.7* 10.8*  HCT 41.6 39.0 37.6 34.5*  MCV 82.4 83.2  --  83.9  PLT 174 192  --  162     Radiology: Dg Hip Operative Unilat With Pelvis Left  10/19/2015  CLINICAL DATA:  Open reduction internal fixation left hip EXAM: OPERATIVE left HIP (WITH PELVIS IF PERFORMED) 10 VIEWS TECHNIQUE: Fluoroscopic spot image(s) were submitted for interpretation post-operatively. COMPARISON:  None. FINDINGS: The patient is status post open reduction internal fixation of left intertrochanteric femoral fracture. There is intra medullary rod in proximal left femur. Metallic locking pin and noted along the left femoral neck. There is  anatomic alignment. A locking screw is noted in proximal shaft of left femur. Fluoroscopy time was 1 minutes 36 seconds. Please see the operative report. IMPRESSION: Status post open reduction internal fixation of left intertrochanteric femoral fracture. Intra medullary rod and metallic fixation pin noted noted in proximal left femur. There is anatomic alignment. Please see the operative report. Electronically Signed   By: Lahoma Crocker M.D.   On: 10/19/2015 12:35     ECG: Atrial fib with RVR rate of 103 bpm.    Impression and Recommendations  1. Atrial fib with RVR: Baseline EKG demonstrated atrial fib with RVR on 2./12/2015 rate of 120, and pre-operatively afib with rate of 92. She is now on amiodarone gtt with only moderate rate control. She is hypotensive,  but has not been taken off of diltiazem po according to [facility]. I will officially take her off of this now.  She is to continue on metoprolol 50 mg BID, and Digoxin 0.125 po daily.      She is not significantly anemic. Continue Eliquis. Will continue amiodarone loading and BB and increase  Digoxin to 0.25 mg/. Try to wean Neo once diltiazem has been discontinued. She has not received any this am. She does not appear to be dehydrated.  Troponin slightly elevated, likely from demand ischemia. Would not repeat echo unless significant elevation in troponin for medical management.   2. Hypotension: Improved on Neo gtt. Would limit narcotics for now, if possible. Try to wean. Monitor CBC.   3. Lung congestion: CXR on admission was negative for pneumonia or CHF, but she is significantly congested this am. Repeating CXR to rule out infective process or CHF due to rapid afib.   4. S/P ORIF of the left hip: Continues some soreness at site with minimal movement.   5. Dementia: Contributing to anxiety. She is on  3 psychotropic mediations.    Signed: Phill Myron. Lawrence NP [facility]  10/20/2015, 9:23 AM Co-Sign MD  The patient was seen and examined, and I agree with the assessment and plan as documented above, with modifications as noted below. 75 yr old woman who developed rapid atrial fibrillation after hip fracture. She had nearly an exact presentation roughly one year ago. At that time rate-control agents were initiated as was Eliquis.  Currently in rapid atrial fibrillation and hypotensive. Chest xray with bibasilar atelectasis. On Eliquis, digoxin, diltiazem, and metoprolol as outpatient. Currently being treated with IV amiodarone. Has h/o CVA. Had moderate to severe left atrial enlargement in 09/2014 with normal LV systolic function and moderate mitral regurgitation.  Recommendations: Had identical presentation one year ago. Will give another 500 cc IV half normal saline due to relative dehydration.  Hgb stable. Will increase digoxin to 0.25 mg daily for now. Due to hypotension, will d/c diltiazem for now. Very low probability for maintaining sinus rhythm given left atrial size, so would not proceed with cardioversion as it is likely to be unsuccessful. Try to wean off IV phenlephrine. Continue metoprolol at present dose for now, but would consider increasing on 2/24. Due to prior h/o CVA, anticoagulation seems appropriate. However, more difficult decision given h/o falls. Hold for now.  Kate Sable, MD, [facility]  10/20/2015 11:10 AM

## 2015-10-21 LAB — BASIC METABOLIC PANEL
Anion gap: 6 (ref 5–15)
BUN: 12 mg/dL (ref 6–20)
CALCIUM: 7.7 mg/dL — AB (ref 8.9–10.3)
CHLORIDE: 106 mmol/L (ref 101–111)
CO2: 25 mmol/L (ref 22–32)
CREATININE: 0.75 mg/dL (ref 0.44–1.00)
GFR calc non Af Amer: >60 mL/min (ref >60)
Glucose, Bld: 92 mg/dL (ref 65–99)
Potassium: 4.3 mmol/L (ref 3.5–5.1)
SODIUM: 137 mmol/L (ref 135–145)

## 2015-10-21 LAB — CBC
HCT: 32.7 % — ABNORMAL LOW (ref 36.0–46.0)
HEMOGLOBIN: 10.2 g/dL — AB (ref 12.0–15.0)
MCH: 26 pg (ref 26.0–34.0)
MCHC: 31.2 g/dL (ref 30.0–36.0)
MCV: 83.4 fL (ref 78.0–100.0)
PLATELETS: 167 10*3/uL (ref 150–400)
RBC: 3.92 MIL/uL (ref 3.87–5.11)
RDW: 16.5 % — AB (ref 11.5–15.5)
WBC: 6.7 10*3/uL (ref 4.0–10.5)

## 2015-10-21 MED ORDER — AMIODARONE HCL 200 MG PO TABS: 200.0000 mg | ORAL_TABLET | Freq: Two times a day (BID) | ORAL | Status: DC

## 2015-10-21 MED ORDER — METOPROLOL TARTRATE 50 MG PO TABS
75.0000 mg | ORAL_TABLET | Freq: Two times a day (BID) | ORAL | Status: DC
  Administered 2015-10-21 – 2015-10-24 (×6): 75 mg via ORAL
  Filled 2015-10-21 (×6): qty 1

## 2015-10-21 NOTE — Evaluation (Signed)
Physical Therapy Evaluation Patient Details Name: Tami [name] MRN: [id number] DOB: 08-01-1941 Today's Date: 10/21/2015   History of Present Illness  75yo white female who sustained a fall in bathroom at home on Friday 2/17, and came to [facility] on 2/20 via EMS after 3d of sustained pain with AMB. Husband refused to bring pt to ED despite c/o pain and request to come in. Pt underwent L hip ORIF and moved to SDU after hypotension issues. PMH: afib c RVR, R THA, T TKA.  Medical record attests to multiple falls, pt attesting 2-3/month. Most recent Tr-I .05 --> 0.7, however Dr. Luan Pulling documenting suspected demand ischemia as causal factor.   Clinical Impression  At evaluation, pt is received semirecumbent in bed, slumped to the right with head at end range cervical flexion + R side-bending upon entry, no family/caregiver present. The pt is awake and agreeable to participate. No acute distress noted at this time. Pt previously held for hypotension and HR appears unstable upon entry (ranging from 87-130bpm at rest) but all VSS with commencement of activity c exception to O2sats (see below). The pt is somewhat alert and oriented to person and time ([place] the [place] times), pleasant, conversational, and following simple commands consistently. Pt reports multiple falls in the last 6 months estimating 2-3 monthly. Pt grip strength is weak and symmetrical; global strength as screened during functional mobility assessment presents with profound impairment in BLE and moderate impairment in BUE, the pt now requiring physical assistance for bed mobility, whereas the patient performs these indep at baseline. Transfers and gait not tested at this time due to poor tolerance.  Pt falls risk is high as evidenced by slow gait speed, forward reach <10", and high falls anxiety during transfers and mobility. Pt received on 2L O2 and moved to RA during evaluation, saturated at >90% for ~10', but c sudden  desaturation (84%), requiring 1L/min for  whereas pt is not on O2 at baseline at home. Pt left on 1L/min at exit, SaO2 @ 100%. Patient presenting with impairment of strength, range of motion, balance, oxygen perfusion, and activity tolerance, limiting ability to perform ADL and mobility tasks at  baseline level of function. Patient will benefit from skilled intervention to address the above impairments and limitations, in order to restore to prior level of function, improve patient safety upon discharge, and to decrease falls risk.       Follow Up Recommendations SNF    Equipment Recommendations       Recommendations for Other Services       Precautions / Restrictions Precautions Precautions: Fall Precaution Comments: per H&P note Restrictions Weight Bearing Restrictions: Yes LLE Weight Bearing: Weight bearing as tolerated      Mobility  Bed Mobility Overal bed mobility: +2 for physical assistance             General bed mobility comments: Does not tolerate due to pain.   Transfers                    Ambulation/Gait                Stairs            Wheelchair Mobility    Modified Rankin (Stroke Patients Only)       Balance  Pertinent Vitals/Pain Pain Assessment: Faces Pain Score: 7  Faces Pain Scale: Hurts worst (per observation.) Pain Location: Inititally R head, neck, scapular pain; L hip pain worse with guarding.  Pain Intervention(s): Limited activity within patient's tolerance;Repositioned;Utilized relaxation techniques;Monitored during session    [place] expects to be discharged to:: Private residence Living Arrangements: Spouse/significant other Available Help at Discharge: Family Type of Home: Mobile home Home Access: Stairs to enter Entrance Stairs-Rails: Right Entrance Stairs-Number of Steps: 5 Home Layout: One level Home Equipment: [place] -  2 wheels;Cane - single point;Bedside commode      Prior Function Level of Independence: Needs assistance   Gait / Transfers Assistance Needed: household distances only with RW in home.   ADL's / Homemaking Assistance Needed: Indep c ADL; husband assists with IADL        Hand Dominance   Dominant Hand: Right    Extremity/Trunk Assessment   Upper Extremity Assessment: Generalized weakness;Overall WFL for tasks assessed           Lower Extremity Assessment: Generalized weakness;LLE deficits/detail   LLE Deficits / Details: profound weakness, unable to actively perform ankle pumps indep.   Cervical / Trunk Assessment:  (head in endrange forward flexion c moderate R sidebending, does nto correct, likely contributory to head ache/neck pain. )  Communication   Communication: No difficulties  Cognition Arousal/Alertness: Awake/alert Behavior During Therapy: Anxious;WFL for tasks assessed/performed (Tangential, confused at times) Overall Cognitive Status: No family/caregiver present to determine baseline cognitive functioning Area of Impairment: Orientation Orientation Level: Disoriented to;Place;Situation (believes to be at the [place] airport. )   Memory: Decreased short-term memory              General Comments      Exercises Total Joint Exercises Ankle Circles/Pumps: AAROM;20 reps;Both Heel Slides: AAROM;Both;15 reps (LLE tolerating 0-25 degrees hip flexion only. ) Hip ABduction/ADduction: AAROM;Both;15 reps      Assessment/Plan    PT Assessment Patient needs continued PT services  PT Diagnosis Generalized weakness;Difficulty walking;Acute pain;Altered mental status   PT Problem List Decreased strength;Decreased range of motion;Decreased activity tolerance;Decreased balance;Decreased mobility;Decreased cognition;Decreased coordination;Decreased knowledge of precautions;Cardiopulmonary status limiting activity  PT Treatment Interventions DME instruction;Gait  training;Functional mobility training;Therapeutic activities;Therapeutic exercise;Balance training;Patient/family education;Cognitive remediation   PT Goals (Current goals can be found in the Care Plan section) Acute Rehab PT Goals Patient Stated Goal: Pt does not want to go 'back to rehab,' but is agreeable that she is not appropriate to go home.  PT Goal Formulation: With patient Time For Goal Achievement: 11/04/15 Potential to Achieve Goals: Fair    Frequency BID (may be changes to QD if patient does not tolerate high frequency PT. )   Barriers to discharge Inaccessible home environment;Decreased caregiver support      Co-evaluation               End of Session Equipment Utilized During Treatment: Oxygen Activity Tolerance: Patient limited by fatigue;Patient limited by pain;Patient limited by lethargy Patient left: in bed;with call bell/phone within reach Nurse Communication: Mobility status;Other (comment)         [id number] PT Time Calculation (min) (ACUTE ONLY): 32 min   Charges:   PT Evaluation $PT Eval High Complexity: 1 Procedure PT Treatments $Therapeutic Exercise: 8-22 mins   PT G Codes:        9:58 AM, 10-31-15 Etta Grandchild, PT, DPT PRN Physical Therapist at [place] License # [id number] (wireless)  [phone] (mobile)

## 2015-10-21 NOTE — Plan of Care (Signed)
Problem: Acute Rehab PT Goals(only PT should resolve) Goal: Pt Will Go Supine/Side To Sit Pt will demonstrate MinA bed mobility supine to sitting edge-of-bed to return to PLOF and to decrease caregiver burden.     Goal: Patient Will Transfer Sit To/From Stand Pt will transfer sit to/from-stand with RW at [facility] without loss-of-balance to demonstrate good safety awareness for independent mobility in home.

## 2015-10-21 NOTE — Progress Notes (Signed)
Physical Therapy Treatment Patient Details Name: Tami [name] MRN: [id number] DOB: 08-17-1941 Today's Date: 10/21/2015    History of Present Illness 75yo white female who sustained a fall in bathroom at home on Friday 2/17, and came to [facility] on 2/20 via EMS after 3d of sustained pain with AMB. Husband refused to bring pt to ED despite c/o pain and request to come in. Pt underwent L hip ORIF and moved to SDU after hypotension issues. PMH: afib c RVR, R THA, T TKA.  Medical record attests to multiple falls, pt attesting 2-3/month. Most recent Tr-I .05 --> 0.7, however Dr. Luan Pulling documenting suspected demand ischemia as causal factor.     PT Comments    Pt was found in her bed with family member present. She has received pain meds ~30 min prior to start however she reported 8/10 pain in her L hip. Pt continues to demonstrate anxiety and hypersensitivity with both LLE and RLE activity, requiring Max to [place] with supine exercises. At this time, she has not been willing to attempt any bed mobility with sitting EOB or transfers sit to stand secondary to increased pain. Therapist educated pt and her family member on the importance of performing exercises and moving as much as possible to improve mobility, strength and decrease risk of developing blood clots. Pt will benefit from continued PT services to address her impairments and facilitate return to PLOF.   Follow Up Recommendations  SNF     Equipment Recommendations       Recommendations for Other Services       Precautions / Restrictions Precautions Precautions: Fall Restrictions Weight Bearing Restrictions: Yes LLE Weight Bearing: Weight bearing as tolerated    Mobility  Bed Mobility Overal bed mobility: +2 for physical assistance             General bed mobility comments: Does not tolerate due to pain. required MaxA x2 for log roll during linen change  Transfers Overall transfer level:  (Unable to assess secondary to pt  unwilling to attempt. )                  Ambulation/Gait                 Stairs            Wheelchair Mobility    Modified Rankin (Stroke Patients Only)       Balance                                    Cognition Arousal/Alertness: Awake/alert Behavior During Therapy: Anxious;WFL for tasks assessed/performed (easily distracted/difficulty maintaining focus to task) Overall Cognitive Status: Within Functional Limits for tasks assessed Area of Impairment: Orientation Orientation Level: Disoriented to;Place   Memory: Decreased short-term memory              Exercises Total Joint Exercises Ankle Circles/Pumps: AAROM;Both;15 reps Heel Slides: AAROM;Both;15 reps;PROM (LLE tolerating~30 degrees hip flexion only, PROM; RLE AROM x15 reps) Hip ABduction/ADduction: AAROM;Both;5 reps;PROM;10 reps (LLE PROM x5 with pt complaining of pain throughout; RLE x10 AAROM)    General Comments        Pertinent Vitals/Pain Pain Assessment: Faces Faces Pain Scale: Hurts whole lot Pain Location: L hip Pain Descriptors / Indicators: Other (Comment) (Unable to describe) Pain Intervention(s): Monitored during session;Premedicated before session;Limited activity within patient's tolerance    Home Living  Prior Function            PT Goals (current goals can now be found in the care plan section) Acute Rehab PT Goals Patient Stated Goal: Pt does not want to go 'back to rehab,' but is agreeable that she is not appropriate to go home.  PT Goal Formulation: With patient Time For Goal Achievement: 11/04/15 Potential to Achieve Goals: Fair    Frequency  BID (may be changes to QD if patient does not tolerate high frequency PT. )    PT Plan Current plan remains appropriate    Co-evaluation             End of Session   Activity Tolerance: Patient limited by pain Patient left: in bed;with call bell/phone within  reach;with family/visitor present     Time: 1440-1516 PT Time Calculation (min) (ACUTE ONLY): 36 min  Charges:  $Therapeutic Exercise: 23-37 mins                    G Codes:        3:29 PM,11/16/2015 Elly Modena PT, DPT [facility] Outpatient Physical Therapy [phone]

## 2015-10-21 NOTE — Progress Notes (Signed)
Subjective: 2 Days Post-Op Procedure(s) (LRB): INTERNAL FIXATION LEFT HIP (Left)   Objective: Vital signs in last 24 hours: Temp:  [97.8 F (36.6 C)-98.4 F (36.9 C)] 97.8 F (36.6 C) (02/24 0400) Pulse Rate:  [25-147] 62 (02/24 0630) Resp:  [12-37] 26 (02/24 0630) BP: (77-167)/(26-154) 106/78 mmHg (02/24 0630) SpO2:  [63 %-100 %] 97 % (02/24 0630) Weight:  [177 lb 4 oz (80.4 kg)] 177 lb 4 oz (80.4 kg) (02/24 0530)  Intake/Output from previous day: 02/23 0701 - 02/24 0700 In: 2063.8 [I.V.:2063.8] Out: 1500 [Urine:1500] Intake/Output this shift:     Recent Labs  10/19/15 1230 10/20/15 0442 10/21/15 0515  HGB 11.7* 10.8* 10.2*    Recent Labs  10/20/15 0442 10/21/15 0515  WBC 5.9 6.7  RBC 4.11 3.92  HCT 34.5* 32.7*  PLT 162 167    Recent Labs  10/20/15 0442 10/21/15 0515  NA 142 137  K 5.1 4.3  CL 109 106  CO2 30 25  BUN 17 12  CREATININE 0.78 0.75  GLUCOSE 86 92  CALCIUM 7.7* 7.7*   No results for input(s): LABPT, INR in the last 72 hours.   Assessment/Plan: 2 Days Post-Op Procedure(s) (LRB): INTERNAL FIXATION LEFT HIP (Left) Chart check: Change dressing  Physical therapy   Hip fracture instructions for ORIF  Procedure open treatment internal fixation with  Gamma nail / short  Weightbearing status as tolerated  Staples removed postop day #  12  Followup visit. POD ~ 28  Postop day # 28th  X-rays please have x-rays performed. Postop day # 28  DVT prophylaxis x 28 days aspirin bid   Arther Abbott 10/21/2015, 8:08 AM

## 2015-10-21 NOTE — Progress Notes (Addendum)
Subjective: She says she feels better. She still having pain in her hip. Her heart rate is significantly improved. She is off pressors now. Cardiology help is noted and appreciated  Objective: Vital signs in last 24 hours: Temp:  [97.8 F (36.6 C)-98.4 F (36.9 C)] 97.8 F (36.6 C) (02/24 0400) Pulse Rate:  [25-147] 62 (02/24 0630) Resp:  [12-37] 26 (02/24 0630) BP: (77-167)/(26-154) 106/78 mmHg (02/24 0630) SpO2:  [63 %-100 %] 97 % (02/24 0630) Weight:  [80.4 kg (177 lb 4 oz)] 80.4 kg (177 lb 4 oz) (02/24 0530) Weight change: 9.185 kg (20 lb 4 oz) Last BM Date: 10/17/15  Intake/Output from previous day: 02/23 0701 - 02/24 0700 In: 2063.8 [I.V.:2063.8] Out: 1500 [Urine:1500]  PHYSICAL EXAM General appearance: alert and Confused as always Resp: clear to auscultation bilaterally Cardio: Irregularly irregular now with rate of about 90 GI: soft, non-tender; bowel sounds normal; no masses,  no organomegaly Extremities: She has now had surgery on both hips.  Lab Results:  Results for orders placed or performed during the hospital encounter of 10/17/15 (from the past 48 hour(s))  Hemoglobin and hematocrit, blood     Status: Abnormal   Collection Time: 10/19/15 12:30 PM  Result Value Ref Range   Hemoglobin 11.7 (L) 12.0 - 15.0 g/dL   HCT 37.6 36.0 - 46.0 %  CBC     Status: Abnormal   Collection Time: 10/20/15  4:42 AM  Result Value Ref Range   WBC 5.9 4.0 - 10.5 K/uL   RBC 4.11 3.87 - 5.11 MIL/uL   Hemoglobin 10.8 (L) 12.0 - 15.0 g/dL   HCT 34.5 (L) 36.0 - 46.0 %   MCV 83.9 78.0 - 100.0 fL   MCH 26.3 26.0 - 34.0 pg   MCHC 31.3 30.0 - 36.0 g/dL   RDW 15.9 (H) 11.5 - 15.5 %   Platelets 162 150 - 400 K/uL  Basic metabolic panel     Status: Abnormal   Collection Time: 10/20/15  4:42 AM  Result Value Ref Range   Sodium 142 135 - 145 mmol/L   Potassium 5.1 3.5 - 5.1 mmol/L    Comment: DELTA CHECK NOTED   Chloride 109 101 - 111 mmol/L   CO2 30 22 - 32 mmol/L   Glucose, Bld  86 65 - 99 mg/dL   BUN 17 6 - 20 mg/dL   Creatinine, Ser 0.78 0.44 - 1.00 mg/dL   Calcium 7.7 (L) 8.9 - 10.3 mg/dL   GFR calc non Af Amer >60 >60 mL/min   GFR calc Af Amer >60 >60 mL/min    Comment: (NOTE) The eGFR has been calculated using the CKD EPI equation. This calculation has not been validated in all clinical situations. eGFR's persistently <60 mL/min signify possible Chronic Kidney Disease.    Anion gap 3 (L) 5 - 15  Troponin I (q 6hr x 3)     Status: Abnormal   Collection Time: 10/20/15  8:54 AM  Result Value Ref Range   Troponin I 0.06 (H) <0.031 ng/mL    Comment:        PERSISTENTLY INCREASED TROPONIN VALUES IN THE RANGE OF 0.04-0.49 ng/mL CAN BE SEEN IN:       -UNSTABLE ANGINA       -CONGESTIVE HEART FAILURE       -MYOCARDITIS       -CHEST TRAUMA       -ARRYHTHMIAS       -LATE PRESENTING MYOCARDIAL INFARCTION       -  COPD   CLINICAL FOLLOW-UP RECOMMENDED.   Troponin I (q 6hr x 3)     Status: Abnormal   Collection Time: 10/20/15  4:22 PM  Result Value Ref Range   Troponin I 0.05 (H) <0.031 ng/mL    Comment:        PERSISTENTLY INCREASED TROPONIN VALUES IN THE RANGE OF 0.04-0.49 ng/mL CAN BE SEEN IN:       -UNSTABLE ANGINA       -CONGESTIVE HEART FAILURE       -MYOCARDITIS       -CHEST TRAUMA       -ARRYHTHMIAS       -LATE PRESENTING MYOCARDIAL INFARCTION       -COPD   CLINICAL FOLLOW-UP RECOMMENDED.   Troponin I (q 6hr x 3)     Status: Abnormal   Collection Time: 10/20/15 10:08 PM  Result Value Ref Range   Troponin I 0.07 (H) <0.031 ng/mL    Comment:        PERSISTENTLY INCREASED TROPONIN VALUES IN THE RANGE OF 0.04-0.49 ng/mL CAN BE SEEN IN:       -UNSTABLE ANGINA       -CONGESTIVE HEART FAILURE       -MYOCARDITIS       -CHEST TRAUMA       -ARRYHTHMIAS       -LATE PRESENTING MYOCARDIAL INFARCTION       -COPD   CLINICAL FOLLOW-UP RECOMMENDED.   CBC     Status: Abnormal   Collection Time: 10/21/15  5:15 AM  Result Value Ref Range   WBC 6.7  4.0 - 10.5 K/uL   RBC 3.92 3.87 - 5.11 MIL/uL   Hemoglobin 10.2 (L) 12.0 - 15.0 g/dL   HCT 32.7 (L) 36.0 - 46.0 %   MCV 83.4 78.0 - 100.0 fL   MCH 26.0 26.0 - 34.0 pg   MCHC 31.2 30.0 - 36.0 g/dL   RDW 16.5 (H) 11.5 - 15.5 %   Platelets 167 150 - 400 K/uL  Basic metabolic panel     Status: Abnormal   Collection Time: 10/21/15  5:15 AM  Result Value Ref Range   Sodium 137 135 - 145 mmol/L   Potassium 4.3 3.5 - 5.1 mmol/L   Chloride 106 101 - 111 mmol/L   CO2 25 22 - 32 mmol/L   Glucose, Bld 92 65 - 99 mg/dL   BUN 12 6 - 20 mg/dL   Creatinine, Ser 0.75 0.44 - 1.00 mg/dL   Calcium 7.7 (L) 8.9 - 10.3 mg/dL   GFR calc non Af Amer >60 >60 mL/min   GFR calc Af Amer >60 >60 mL/min    Comment: (NOTE) The eGFR has been calculated using the CKD EPI equation. This calculation has not been validated in all clinical situations. eGFR's persistently <60 mL/min signify possible Chronic Kidney Disease.    Anion gap 6 5 - 15    ABGS No results for input(s): PHART, PO2ART, TCO2, HCO3 in the last 72 hours.  Invalid input(s): PCO2 CULTURES Recent Results (from the past 240 hour(s))  Surgical PCR screen     Status: Abnormal   Collection Time: 10/18/15  5:00 AM  Result Value Ref Range Status   MRSA, PCR NEGATIVE NEGATIVE Final   Staphylococcus aureus POSITIVE (A) NEGATIVE Final    Comment:        The Xpert SA Assay (FDA approved for NASAL specimens in patients over 37 years of age), is one component of a comprehensive surveillance program.  Test performance has been validated by  [facility] for patients greater than or equal to 22 year old. It is not intended to diagnose infection nor to guide or monitor treatment. RESULT CALLED TO, READ BACK BY AND VERIFIED WITH: EVERETT,R. AT 2585 ON 10/18/2015 BY BAUGHAM,M.    Studies/Results: Dg Chest Port 1 View  10/20/2015  CLINICAL DATA:  Left sided PICC line placement. EXAM: PORTABLE CHEST 1 VIEW COMPARISON:  Chest x-ray, same date.  FINDINGS: Of the left-sided PICC line tip is in the distal SVC just above the region of the cavoatrial junction. No complicating features are demonstrated. The heart and lungs are unchanged. IMPRESSION: Well-positioned PICC line with the tip in the distal SVC. Electronically Signed   By: Marijo Sanes M.D.   On: 10/20/2015 14:11   Dg Chest Port 1 View  10/20/2015  CLINICAL DATA:  75 year old female with shortness of breath and recent hip fracture. EXAM: PORTABLE CHEST 1 VIEW COMPARISON:  10/17/2015 FINDINGS: Cardiomediastinal silhouette is unchanged. This is a mildly low volume film with mild bibasilar atelectasis. There is no evidence of focal airspace disease, pulmonary edema, suspicious pulmonary nodule/mass, pleural effusion, or pneumothorax. No acute bony abnormalities are identified. IMPRESSION: No significant change -mild bibasilar atelectasis. Electronically Signed   By: Margarette Canada M.D.   On: 10/20/2015 10:17   Dg Hip Operative Unilat With Pelvis Left  10/19/2015  CLINICAL DATA:  Open reduction internal fixation left hip EXAM: OPERATIVE left HIP (WITH PELVIS IF PERFORMED) 10 VIEWS TECHNIQUE: Fluoroscopic spot image(s) were submitted for interpretation post-operatively. COMPARISON:  None. FINDINGS: The patient is status post open reduction internal fixation of left intertrochanteric femoral fracture. There is intra medullary rod in proximal left femur. Metallic locking pin and noted along the left femoral neck. There is anatomic alignment. A locking screw is noted in proximal shaft of left femur. Fluoroscopy time was 1 minutes 36 seconds. Please see the operative report. IMPRESSION: Status post open reduction internal fixation of left intertrochanteric femoral fracture. Intra medullary rod and metallic fixation pin noted noted in proximal left femur. There is anatomic alignment. Please see the operative report. Electronically Signed   By: Lahoma Crocker M.D.   On: 10/19/2015 12:35    Medications:   Prior to Admission:  Prescriptions prior to admission  Medication Sig Dispense Refill Last Dose  . apixaban (ELIQUIS) 5 MG TABS tablet Take 1 tablet (5 mg total) by mouth 2 (two) times daily. 60 tablet  10/17/2015 at Unknown time  . CALCIUM PO Take 2 tablets by mouth every morning.   10/17/2015 at Unknown time  . diltiazem (CARDIZEM CD) 120 MG 24 hr capsule Take 1 capsule (120 mg total) by mouth daily.   10/17/2015 at Unknown time  . donepezil (ARICEPT) 10 MG tablet Take 20 mg by mouth at bedtime.   10/16/2015 at Unknown time  . doxepin (SINEQUAN) 150 MG capsule Take 150 mg by mouth at bedtime.   11 10/16/2015 at Unknown time  . gabapentin (NEURONTIN) 400 MG capsule Take 400 mg by mouth 3 (three) times daily.    10/17/2015 at Unknown time  . HYDROcodone-acetaminophen (NORCO) 10-325 MG per tablet Take 1 tablet by mouth 4 (four) times daily.   10/17/2015 at Unknown time  . LORazepam (ATIVAN) 0.5 MG tablet Take 1 tablet (0.5 mg total) by mouth every 6 (six) hours as needed for anxiety. 30 tablet 0 10/16/2015 at Unknown time  . methocarbamol (ROBAXIN) 500 MG tablet Take 1 tablet (500 mg total) by mouth  every 6 (six) hours as needed for muscle spasms.   10/16/2015 at Unknown time  . metoprolol (LOPRESSOR) 50 MG tablet Take 1 tablet (50 mg total) by mouth every 6 (six) hours. (Patient taking differently: Take 50 mg by mouth 2 (two) times daily. )   10/17/2015 at 900  . pravastatin (PRAVACHOL) 40 MG tablet Take 40 mg by mouth every evening.   10/16/2015 at Unknown time  . sertraline (ZOLOFT) 100 MG tablet Take 200 mg by mouth every morning.    10/17/2015 at Unknown time  . zolpidem (AMBIEN) 5 MG tablet Take 5 mg by mouth at bedtime as needed. For sleep  0 10/16/2015 at Unknown time  . albuterol (PROVENTIL) (2.5 MG/3ML) 0.083% nebulizer solution Take 3 mLs (2.5 mg total) by nebulization every 4 (four) hours as needed for wheezing or shortness of breath. (Patient not taking: Reported on 10/17/2015) 75 mL 12 Not Taking   . benazepril (LOTENSIN) 20 MG tablet Take 20 mg by mouth daily. Reported on 10/17/2015   Not Taking at Unknown time  . digoxin (LANOXIN) 0.125 MG tablet Take 1 tablet (0.125 mg total) by mouth daily. (Patient not taking: Reported on 10/17/2015)   Not Taking at Unknown time  . oxyCODONE-acetaminophen (PERCOCET/ROXICET) 5-325 MG per tablet Take 1 tablet by mouth every 4 (four) hours as needed for severe pain. (Patient not taking: Reported on 10/17/2015) 10 tablet 0 Completed Course at Unknown time   Scheduled: . aspirin EC  325 mg Oral BID  . Chlorhexidine Gluconate Cloth  6 each Topical Daily  . digoxin  0.25 mg Oral Daily  . docusate sodium  100 mg Oral BID  . donepezil  20 mg Oral QHS  . doxepin  150 mg Oral QHS  . gabapentin  400 mg Oral TID  . metoprolol  50 mg Oral BID  . mupirocin ointment  1 application Nasal BID  . pravastatin  40 mg Oral QPM  . senna  1 tablet Oral BID  . sertraline  200 mg Oral q morning - 10a   Continuous: . sodium chloride 125 mL/hr at 10/21/15 0600  . amiodarone 30 mg/hr (10/21/15 0627)  . phenylephrine (NEO-SYNEPHRINE) Adult infusion Stopped (10/20/15 1958)   CLE:[id number], bisacodyl, LORazepam, magnesium citrate, menthol-cetylpyridinium **OR** phenol, methocarbamol, metoCLOPramide **OR** metoCLOPramide (REGLAN) injection, morphine injection, ondansetron **OR** ondansetron (ZOFRAN) IV, oxyCODONE-acetaminophen, senna-docusate, traZODone  Assesment: She has intertrochanteric fracture of left hip which was repaired. She developed worsening problems with atrial fibrillation with RVR postoperatively and was hypotensive. Her blood pressure is significantly better. She is on amiodarone with better control of her heart rate. Potentially could increase metoprolol today and I will leave that decision to cardiology.  She has been chronically anticoagulated. This is being held at the moment  She had a previous stroke presumably related to atrial fibrillation  She has  elevated troponin felt to be secondary to demand ischemia not due to acute myocardial infarction  Echocardiogram shows good left ventricular function Principal Problem:   Hip fracture, left [facility]) Active Problems:   Essential hypertension   Atrial fibrillation with RVR ([place])   Fall at home   Dementia   Intertrochanteric fracture of left hip ([place])   Intertrochanteric fracture of left femur ([place])    Plan: Continue treatments. No change in medications at this point. Once she seen by cardiology consultant she may have increase in metoprolol. Remain in the intensive care unit for now    LOS: 4 days   Feliza Diven L 10/21/2015, 7:42  AM   

## 2015-10-21 NOTE — Care Management Important Message (Signed)
Important Message  Patient Details  Name: Tami [name] MRN: [id number] Date of Birth: 1941-04-21   Medicare Important Message Given:  Yes    Sherald Barge, RN 10/21/2015, 2:05 PM

## 2015-10-21 NOTE — Progress Notes (Signed)
Primary Cardiologist: Jenkins Rouge MD  Cardiology Specific Problem List: 1. Atrial fib with RVR-CHADS VASC Score of 6 2. Hypotension   Subjective:   Confused. Complains of soreness in her left hip.   Objective:   Temp:  [97.8 F (36.6 C)-98.9 F (37.2 C)] 98.1 F (36.7 C) (02/24 1200) Pulse Rate:  [25-131] 97 (02/24 1400) Resp:  [12-37] 16 (02/24 1400) BP: (88-167)/(50-154) 116/78 mmHg (02/24 1400) SpO2:  [72 %-100 %] 95 % (02/24 1440) Weight:  [177 lb 4 oz (80.4 kg)] 177 lb 4 oz (80.4 kg) (02/24 0530) Last BM Date: 10/17/15  Filed Weights   10/19/15 0924 10/20/15 0500 10/21/15 0530  Weight: 157 lb (71.215 kg) 171 lb 15.3 oz (78 kg) 177 lb 4 oz (80.4 kg)    Intake/Output Summary (Last 24 hours) at 10/21/15 1541 Last data filed at 10/21/15 1508  Gross per 24 hour  Intake 4303.78 ml  Output   1550 ml  Net 2753.78 ml    Telemetry: Atrial fib with rates in the 80's to 90's.   Exam:  General: No acute distress.  HEENT: Conjunctiva and lids normal, oropharynx clear.  Lungs: Clear to auscultation, nonlabored.  Cardiac: No elevated JVP or bruits. IRRR, no gallop or rub.   Abdomen: Normoactive bowel sounds, nontender, nondistended.  Extremities: No pitting edema, distal pulses full.  Neuropsychiatric: Alert and confused,  affect appropriate.  Echocardiogram 10/20/2015 Left ventricle: The cavity size was normal. Wall thickness was increased in a pattern of mild LVH. Systolic function was normal. The estimated ejection fraction was in the range of 55% to 60%. Wall motion was normal; there were no regional wall motion abnormalities. The study was not technically sufficient to allow evaluation of LV diastolic dysfunction due to atrial fibrillation. - Aortic valve: Trileaflet; moderately thickened and calcified non-coronary cusp. No significant aortic valve stenosis. There was mild regurgitation. - Aorta: Mild aortic root dilatation. Aortic  root dimension: 40 mm (ED). - Mitral valve: Calcified annulus. Normal thickness leaflets . There was mild regurgitation. - Left atrium: The atrium was moderately dilated. - Right ventricle: The cavity size was mildly dilated. Systolic function was low normal to mildly reduced. - Right atrium: The atrium was mildly to moderately dilated. Central venous pressure (est): 15 mm Hg. - Tricuspid valve: There was mild-moderate regurgitation. - Pulmonary arteries: Systolic pressure was moderately increased. PA peak pressure: 56 mm Hg (S). - Inferior vena cava: The vessel was dilated. The respirophasic diameter changes were blunted (< 50%), consistent with elevated central venous pressure.  Lab Results:  Basic Metabolic Panel:  Recent Labs Lab 10/18/15 0620 10/20/15 0442 10/21/15 0515  NA 140 142 137  K 4.1 5.1 4.3  CL 102 109 106  CO2 30 30 25   GLUCOSE 95 86 92  BUN 22* 17 12  CREATININE 0.88 0.78 0.75  CALCIUM 8.5* 7.7* 7.7*    CBC:  Recent Labs Lab 10/18/15 0620 10/19/15 1230 10/20/15 0442 10/21/15 0515  WBC 6.7  --  5.9 6.7  HGB 12.2 11.7* 10.8* 10.2*  HCT 39.0 37.6 34.5* 32.7*  MCV 83.2  --  83.9 83.4  PLT 192  --  162 167    Cardiac Enzymes:  Recent Labs Lab 10/20/15 0854 10/20/15 1622 10/20/15 2208  TROPONINI 0.06* 0.05* 0.07*    Coagulation:  Recent Labs Lab 10/17/15 1524  INR 1.53*    Radiology: Dg Chest Port 1 View  10/20/2015  CLINICAL DATA:  Left sided PICC line placement. EXAM: PORTABLE  CHEST 1 VIEW COMPARISON:  Chest x-ray, same date. FINDINGS: Of the left-sided PICC line tip is in the distal SVC just above the region of the cavoatrial junction. No complicating features are demonstrated. The heart and lungs are unchanged. IMPRESSION: Well-positioned PICC line with the tip in the distal SVC. Electronically Signed   By: Marijo Sanes M.D.   On: 10/20/2015 14:11   Dg Chest Port 1 View  10/20/2015  CLINICAL DATA:  75 year old  female with shortness of breath and recent hip fracture. EXAM: PORTABLE CHEST 1 VIEW COMPARISON:  10/17/2015 FINDINGS: Cardiomediastinal silhouette is unchanged. This is a mildly low volume film with mild bibasilar atelectasis. There is no evidence of focal airspace disease, pulmonary edema, suspicious pulmonary nodule/mass, pleural effusion, or pneumothorax. No acute bony abnormalities are identified. IMPRESSION: No significant change -mild bibasilar atelectasis. Electronically Signed   By: Margarette Canada M.D.   On: 10/20/2015 10:17     Medications:   Scheduled Medications: . aspirin EC  325 mg Oral BID  . Chlorhexidine Gluconate Cloth  6 each Topical Daily  . digoxin  0.25 mg Oral Daily  . docusate sodium  100 mg Oral BID  . donepezil  20 mg Oral QHS  . doxepin  150 mg Oral QHS  . gabapentin  400 mg Oral TID  . metoprolol  50 mg Oral BID  . mupirocin ointment  1 application Nasal BID  . pravastatin  40 mg Oral QPM  . senna  1 tablet Oral BID  . sertraline  200 mg Oral q morning - 10a    Infusions: . sodium chloride 125 mL/hr at 10/21/15 1400  . amiodarone 30 mg/hr (10/21/15 1400)  . phenylephrine (NEO-SYNEPHRINE) Adult infusion Stopped (10/20/15 1958)    PRN Medications: albuterol, bisacodyl, LORazepam, magnesium citrate, menthol-cetylpyridinium **OR** phenol, methocarbamol, metoCLOPramide **OR** metoCLOPramide (REGLAN) injection, morphine injection, ondansetron **OR** ondansetron (ZOFRAN) IV, oxyCODONE-acetaminophen, senna-docusate, traZODone   Assessment and Plan:   1.Atrial fib with RVR: Continues on amiodarone load. Will discontinue and start po 200 mg BID. Continue digoxin 0.25 mg daily, metoprolol 50 mg BID. She can be moved to telemetry after IV amiodarone d/c'd, No anticoagulation due to falls.   2. Hypotension: Off pressors, BP has normalized.   3. S/P ORIF of left hip, due to mechanical fall: Followed by Dr. Aline Brochure. PT working with her. Monitor BP and HR response to  increased activity.   Phill Myron. Lawrence NP [place]  10/21/2015, 3:41 PM   The patient was seen and examined, and I agree with the assessment and plan as documented above, with modifications as noted below. Ventricular rates better controlled but not optimal. Remains in atrial fibrillation. Given left atrial size, low probability of maintaining sinus rhythm.  Due to prior h/o CVA, anticoagulation seems appropriate. However, more difficult decision given h/o falls. Continue to hold.  Will d/c IV amiodarone and increase metoprolol dose. BP normalized. Will hold off on reinstitution of oral diltiazem and monitor BP with increased dose of beta blocker.  Kate Sable, MD, [facility]  10/21/2015 4:28 PM

## 2015-10-22 LAB — CBC
HCT: 32.5 % — ABNORMAL LOW (ref 36.0–46.0)
HEMOGLOBIN: 10.3 g/dL — AB (ref 12.0–15.0)
MCH: 26.4 pg (ref 26.0–34.0)
MCHC: 31.7 g/dL (ref 30.0–36.0)
MCV: 83.3 fL (ref 78.0–100.0)
Platelets: 186 10*3/uL (ref 150–400)
RBC: 3.9 MIL/uL (ref 3.87–5.11)
RDW: 16.6 % — AB (ref 11.5–15.5)
WBC: 7.4 10*3/uL (ref 4.0–10.5)

## 2015-10-22 LAB — TYPE AND SCREEN
ABO/RH(D): A POS
Antibody Screen: NEGATIVE
UNIT DIVISION: 0
Unit division: 0
Unit division: 0

## 2015-10-22 NOTE — Progress Notes (Signed)
Physical Therapy Treatment Patient Details Name: Tami [name] MRN: [id number] DOB: 1940-09-20 Today's Date: 10/22/2015    History of Present Illness 75yo white female who sustained a fall in bathroom at home on Friday 2/17, and [facility] via EMS after 3d of sustained pain with AMB. Husband refused to bring pt to ED despite c/o pain and request to come in. Pt underwent L hip ORIF and moved to SDU after hypotension issues. PMH: afib c RVR, R THA, T TKA.  Medical record attests to multiple falls, pt attesting 2-3/month. Most recent Tr-I .05 --> 0.7, however Dr. Luan Pulling documenting suspected demand ischemia as causal factor.     PT Comments    Pt agreed to full participation this session.  Pain continues to be most limiting factor.  Able to transfer to EOB with max assist from therapist and slow mobility/repositioning.  Pt with difficulty establishing and maintaining sitting balance.  Required max assist stand pivot transfer to chair and positioning.  Pt completed AAROM therex on lt LE and AROM on Rt in seated position.  Pt with improved mobility today.  Follow Up Recommendations  SNF     Equipment Recommendations       Recommendations for Other Services       Precautions / Restrictions Restrictions Weight Bearing Restrictions: Yes LLE Weight Bearing: Weight bearing as tolerated    Mobility  Bed Mobility Overal bed mobility: +2 for physical assistance             General bed mobility comments: slow due to pain, transferred supine to sit  Transfers Overall transfer level: Needs assistance Equipment used: None Transfers: Stand Pivot Transfers   Stand pivot transfers: Max assist       General transfer comment: Pt limited by pain, weight bearing through Rt LE only  Ambulation/Gait                 Stairs            Wheelchair Mobility    Modified Rankin (Stroke Patients Only)       Balance                                     Cognition Arousal/Alertness: Awake/alert Behavior During Therapy: Anxious;WFL for tasks assessed/performed (easily distracted/difficulty maintaining focus to task) Overall Cognitive Status: Within Functional Limits for tasks assessed Area of Impairment: Orientation Orientation Level: Disoriented to;Place   Memory: Decreased short-term memory              Exercises General Exercises - Lower Extremity Ankle Circles/Pumps: AROM;Both;10 reps Long Arc Quad: AROM;AAROM;Seated;Both;10 reps        Pertinent Vitals/Pain Pain Assessment: 0-10 Pain Score: 8  Pain Location: Lt hip Pain Descriptors / Indicators: Aching;Sharp Pain Intervention(s): Repositioned;Monitored during session    Home Living                      Prior Function            PT Goals (current goals can now be found in the care plan section) Progress towards PT goals: Progressing toward goals    Frequency  BID (may be changes to QD if patient does not tolerate high frequency PT. )    PT Plan Current plan remains appropriate       End of Session   Activity Tolerance: Patient limited by pain Patient left:  with call bell/phone within reach;in chair     Time: 1045-1120 PT Time Calculation (min) (ACUTE ONLY): 35 min  Charges:  $Therapeutic Exercise: 8-22 mins $Therapeutic Activity: 8-22 mins                    G CodesTeena Irani, PTA/CLT [phone]  10/22/2015, 12:23 PM

## 2015-10-22 NOTE — Progress Notes (Signed)
Subjective: She looks better in general. Her heart rate is better. Blood pressure is much improved. She is still confused  Objective: Vital signs in last 24 hours: Temp:  [97 F (36.1 C)-98.1 F (36.7 C)] 98 F (36.7 C) (02/25 0400) Pulse Rate:  [30-114] 67 (02/25 0600) Resp:  [14-29] 15 (02/25 0600) BP: (111-154)/(65-125) 119/65 mmHg (02/25 0600) SpO2:  [81 %-99 %] 91 % (02/25 0600) Weight:  [84 kg (185 lb 3 oz)] 84 kg (185 lb 3 oz) (02/25 0400) Weight change: 3.6 kg (7 lb 15 oz) Last BM Date: 10/21/15  Intake/Output from previous day: 02/24 0701 - 02/25 0700 In: 4480 [P.O.:480; I.V.:4000] Out: 2150 [Urine:2150]  PHYSICAL EXAM General appearance: alert, cooperative and mild distress Resp: clear to auscultation bilaterally Cardio: irregularly irregular rhythm GI: soft, non-tender; bowel sounds normal; no masses,  no organomegaly Extremities: extremities normal, atraumatic, no cyanosis or edema  Lab Results:  Results for orders placed or performed during the hospital encounter of 10/17/15 (from the past 48 hour(s))  Troponin I (q 6hr x 3)     Status: Abnormal   Collection Time: 10/20/15  4:22 PM  Result Value Ref Range   Troponin I 0.05 (H) <0.031 ng/mL    Comment:        PERSISTENTLY INCREASED TROPONIN VALUES IN THE RANGE OF 0.04-0.49 ng/mL CAN BE SEEN IN:       -UNSTABLE ANGINA       -CONGESTIVE HEART FAILURE       -MYOCARDITIS       -CHEST TRAUMA       -ARRYHTHMIAS       -LATE PRESENTING MYOCARDIAL INFARCTION       -COPD   CLINICAL FOLLOW-UP RECOMMENDED.   Troponin I (q 6hr x 3)     Status: Abnormal   Collection Time: 10/20/15 10:08 PM  Result Value Ref Range   Troponin I 0.07 (H) <0.031 ng/mL    Comment:        PERSISTENTLY INCREASED TROPONIN VALUES IN THE RANGE OF 0.04-0.49 ng/mL CAN BE SEEN IN:       -UNSTABLE ANGINA       -CONGESTIVE HEART FAILURE       -MYOCARDITIS       -CHEST TRAUMA       -ARRYHTHMIAS       -LATE PRESENTING MYOCARDIAL  INFARCTION       -COPD   CLINICAL FOLLOW-UP RECOMMENDED.   CBC     Status: Abnormal   Collection Time: 10/21/15  5:15 AM  Result Value Ref Range   WBC 6.7 4.0 - 10.5 K/uL   RBC 3.92 3.87 - 5.11 MIL/uL   Hemoglobin 10.2 (L) 12.0 - 15.0 g/dL   HCT 32.7 (L) 36.0 - 46.0 %   MCV 83.4 78.0 - 100.0 fL   MCH 26.0 26.0 - 34.0 pg   MCHC 31.2 30.0 - 36.0 g/dL   RDW 16.5 (H) 11.5 - 15.5 %   Platelets 167 150 - 400 K/uL  Basic metabolic panel     Status: Abnormal   Collection Time: 10/21/15  5:15 AM  Result Value Ref Range   Sodium 137 135 - 145 mmol/L   Potassium 4.3 3.5 - 5.1 mmol/L   Chloride 106 101 - 111 mmol/L   CO2 25 22 - 32 mmol/L   Glucose, Bld 92 65 - 99 mg/dL   BUN 12 6 - 20 mg/dL   Creatinine, Ser 0.75 0.44 - 1.00 mg/dL   Calcium 7.7 (L) 8.9 -  10.3 mg/dL   GFR calc non Af Amer >60 >60 mL/min   GFR calc Af Amer >60 >60 mL/min    Comment: (NOTE) The eGFR has been calculated using the CKD EPI equation. This calculation has not been validated in all clinical situations. eGFR's persistently <60 mL/min signify possible Chronic Kidney Disease.    Anion gap 6 5 - 15  CBC     Status: Abnormal   Collection Time: 10/22/15  4:35 AM  Result Value Ref Range   WBC 7.4 4.0 - 10.5 K/uL   RBC 3.90 3.87 - 5.11 MIL/uL   Hemoglobin 10.3 (L) 12.0 - 15.0 g/dL   HCT 32.5 (L) 36.0 - 46.0 %   MCV 83.3 78.0 - 100.0 fL   MCH 26.4 26.0 - 34.0 pg   MCHC 31.7 30.0 - 36.0 g/dL   RDW 16.6 (H) 11.5 - 15.5 %   Platelets 186 150 - 400 K/uL    ABGS No results for input(s): PHART, PO2ART, TCO2, HCO3 in the last 72 hours.  Invalid input(s): PCO2 CULTURES Recent Results (from the past 240 hour(s))  Surgical PCR screen     Status: Abnormal   Collection Time: 10/18/15  5:00 AM  Result Value Ref Range Status   MRSA, PCR NEGATIVE NEGATIVE Final   Staphylococcus aureus POSITIVE (A) NEGATIVE Final    Comment:        The Xpert SA Assay (FDA approved for NASAL specimens in patients over 21 years of  age), is one component of a comprehensive surveillance program.  Test performance has been validated by [facility] for patients greater than or equal to 76 year old. It is not intended to diagnose infection nor to guide or monitor treatment. RESULT CALLED TO, READ BACK BY AND VERIFIED WITH: EVERETT,R. AT 2094 ON 10/18/2015 BY BAUGHAM,M.    Studies/Results: Dg Chest Port 1 View  10/20/2015  CLINICAL DATA:  Left sided PICC line placement. EXAM: PORTABLE CHEST 1 VIEW COMPARISON:  Chest x-ray, same date. FINDINGS: Of the left-sided PICC line tip is in the distal SVC just above the region of the cavoatrial junction. No complicating features are demonstrated. The heart and lungs are unchanged. IMPRESSION: Well-positioned PICC line with the tip in the distal SVC. Electronically Signed   By: Marijo Sanes M.D.   On: 10/20/2015 14:11    Medications:  Prior to Admission:  Prescriptions prior to admission  Medication Sig Dispense Refill Last Dose  . apixaban (ELIQUIS) 5 MG TABS tablet Take 1 tablet (5 mg total) by mouth 2 (two) times daily. 60 tablet  10/17/2015 at Unknown time  . CALCIUM PO Take 2 tablets by mouth every morning.   10/17/2015 at Unknown time  . diltiazem (CARDIZEM CD) 120 MG 24 hr capsule Take 1 capsule (120 mg total) by mouth daily.   10/17/2015 at Unknown time  . donepezil (ARICEPT) 10 MG tablet Take 20 mg by mouth at bedtime.   10/16/2015 at Unknown time  . doxepin (SINEQUAN) 150 MG capsule Take 150 mg by mouth at bedtime.   11 10/16/2015 at Unknown time  . gabapentin (NEURONTIN) 400 MG capsule Take 400 mg by mouth 3 (three) times daily.    10/17/2015 at Unknown time  . HYDROcodone-acetaminophen (NORCO) 10-325 MG per tablet Take 1 tablet by mouth 4 (four) times daily.   10/17/2015 at Unknown time  . LORazepam (ATIVAN) 0.5 MG tablet Take 1 tablet (0.5 mg total) by mouth every 6 (six) hours as needed for anxiety. 30 tablet  0 10/16/2015 at Unknown time  . methocarbamol (ROBAXIN) 500 MG  tablet Take 1 tablet (500 mg total) by mouth every 6 (six) hours as needed for muscle spasms.   10/16/2015 at Unknown time  . metoprolol (LOPRESSOR) 50 MG tablet Take 1 tablet (50 mg total) by mouth every 6 (six) hours. (Patient taking differently: Take 50 mg by mouth 2 (two) times daily. )   10/17/2015 at 900  . pravastatin (PRAVACHOL) 40 MG tablet Take 40 mg by mouth every evening.   10/16/2015 at Unknown time  . sertraline (ZOLOFT) 100 MG tablet Take 200 mg by mouth every morning.    10/17/2015 at Unknown time  . zolpidem (AMBIEN) 5 MG tablet Take 5 mg by mouth at bedtime as needed. For sleep  0 10/16/2015 at Unknown time  . albuterol (PROVENTIL) (2.5 MG/3ML) 0.083% nebulizer solution Take 3 mLs (2.5 mg total) by nebulization every 4 (four) hours as needed for wheezing or shortness of breath. (Patient not taking: Reported on 10/17/2015) 75 mL 12 Not Taking  . benazepril (LOTENSIN) 20 MG tablet Take 20 mg by mouth daily. Reported on 10/17/2015   Not Taking at Unknown time  . digoxin (LANOXIN) 0.125 MG tablet Take 1 tablet (0.125 mg total) by mouth daily. (Patient not taking: Reported on 10/17/2015)   Not Taking at Unknown time  . oxyCODONE-acetaminophen (PERCOCET/ROXICET) 5-325 MG per tablet Take 1 tablet by mouth every 4 (four) hours as needed for severe pain. (Patient not taking: Reported on 10/17/2015) 10 tablet 0 Completed Course at Unknown time   Scheduled: . aspirin EC  325 mg Oral BID  . digoxin  0.25 mg Oral Daily  . docusate sodium  100 mg Oral BID  . donepezil  20 mg Oral QHS  . doxepin  150 mg Oral QHS  . gabapentin  400 mg Oral TID  . metoprolol  75 mg Oral BID  . mupirocin ointment  1 application Nasal BID  . pravastatin  40 mg Oral QPM  . senna  1 tablet Oral BID  . sertraline  200 mg Oral q morning - 10a   Continuous: . sodium chloride 125 mL/hr at 10/22/15 0600  . phenylephrine (NEO-SYNEPHRINE) Adult infusion Stopped (10/20/15 1958)   [id number], bisacodyl, LORazepam, magnesium  citrate, menthol-cetylpyridinium **OR** phenol, methocarbamol, metoCLOPramide **OR** metoCLOPramide (REGLAN) injection, morphine injection, ondansetron **OR** ondansetron (ZOFRAN) IV, oxyCODONE-acetaminophen, senna-docusate, traZODone  Assesment: She had a left hip fracture. She has had atrial fibrillation with rapid ventricular response. Her atrial fibrillation is better. She was hypotensive postop and briefly required pressors but is off now. Overall she is improved. She has dementia at baseline which is stable Principal Problem:   Hip fracture, left [facility]) Active Problems:   Essential hypertension   Atrial fibrillation with RVR ([place])   Fall at home   Dementia   Intertrochanteric fracture of left hip ([place])   Intertrochanteric fracture of left femur ([place])    Plan: Transfer from ICU    LOS: 5 days   Makensey Rego L 10/22/2015, 9:47 AM

## 2015-10-23 NOTE — Progress Notes (Signed)
Physical Therapy Note  Patient Details  Name: Tami [name] MRN: [id number] Date of Birth: [birth year] Today's Date: 10/23/2015    Pt refused treatment today stating she was hurting.  Attempted LE exercises in supine, however pt c/o increased pain and requested to discontinue activity.  Teena Irani, PTA/CLT [phone]    10/23/2015, 10:57 AM

## 2015-10-23 NOTE — Progress Notes (Signed)
Subjective: She seems to be doing okay. No new complaints.  Objective: Vital signs in last 24 hours: Temp:  [97 F (36.1 C)-98.3 F (36.8 C)] 98.3 F (36.8 C) (02/26 0808) Pulse Rate:  [28-103] 88 (02/26 0900) Resp:  [12-26] 16 (02/26 0900) BP: (105-156)/(55-131) 125/98 mmHg (02/26 0900) SpO2:  [79 %-99 %] 97 % (02/26 0900) Weight:  [84.1 kg (185 lb 6.5 oz)] 84.1 kg (185 lb 6.5 oz) (02/26 0400) Weight change: 0.1 kg (3.5 oz) Last BM Date: 10/22/15  Intake/Output from previous day: 02/25 0701 - 02/26 0700 In: 2995 [P.O.:120; I.V.:2875] Out: 1500 [Urine:1500]  PHYSICAL EXAM General appearance: alert, cooperative, no distress and Confused Resp: rhonchi bilaterally Cardio: irregularly irregular rhythm GI: soft, non-tender; bowel sounds normal; no masses,  no organomegaly Extremities: extremities normal, atraumatic, no cyanosis or edema  Lab Results:  Results for orders placed or performed during the hospital encounter of 10/17/15 (from the past 48 hour(s))  CBC     Status: Abnormal   Collection Time: 10/22/15  4:35 AM  Result Value Ref Range   WBC 7.4 4.0 - 10.5 K/uL   RBC 3.90 3.87 - 5.11 MIL/uL   Hemoglobin 10.3 (L) 12.0 - 15.0 g/dL   HCT 32.5 (L) 36.0 - 46.0 %   MCV 83.3 78.0 - 100.0 fL   MCH 26.4 26.0 - 34.0 pg   MCHC 31.7 30.0 - 36.0 g/dL   RDW 16.6 (H) 11.5 - 15.5 %   Platelets 186 150 - 400 K/uL    ABGS No results for input(s): PHART, PO2ART, TCO2, HCO3 in the last 72 hours.  Invalid input(s): PCO2 CULTURES Recent Results (from the past 240 hour(s))  Surgical PCR screen     Status: Abnormal   Collection Time: 10/18/15  5:00 AM  Result Value Ref Range Status   MRSA, PCR NEGATIVE NEGATIVE Final   Staphylococcus aureus POSITIVE (A) NEGATIVE Final    Comment:        The Xpert SA Assay (FDA approved for NASAL specimens in patients over 40 years of age), is one component of a comprehensive surveillance program.  Test performance has been validated by  [facility] for patients greater than or equal to 47 year old. It is not intended to diagnose infection nor to guide or monitor treatment. RESULT CALLED TO, READ BACK BY AND VERIFIED WITH: EVERETT,R. AT 1317 ON 10/18/2015 BY BAUGHAM,M.    Studies/Results: No results found.  Medications:  Prior to Admission:  Prescriptions prior to admission  Medication Sig Dispense Refill Last Dose  . apixaban (ELIQUIS) 5 MG TABS tablet Take 1 tablet (5 mg total) by mouth 2 (two) times daily. 60 tablet  10/17/2015 at Unknown time  . CALCIUM PO Take 2 tablets by mouth every morning.   10/17/2015 at Unknown time  . diltiazem (CARDIZEM CD) 120 MG 24 hr capsule Take 1 capsule (120 mg total) by mouth daily.   10/17/2015 at Unknown time  . donepezil (ARICEPT) 10 MG tablet Take 20 mg by mouth at bedtime.   10/16/2015 at Unknown time  . doxepin (SINEQUAN) 150 MG capsule Take 150 mg by mouth at bedtime.   11 10/16/2015 at Unknown time  . gabapentin (NEURONTIN) 400 MG capsule Take 400 mg by mouth 3 (three) times daily.    10/17/2015 at Unknown time  . HYDROcodone-acetaminophen (NORCO) 10-325 MG per tablet Take 1 tablet by mouth 4 (four) times daily.   10/17/2015 at Unknown time  . LORazepam (ATIVAN) 0.5 MG tablet Take 1  tablet (0.5 mg total) by mouth every 6 (six) hours as needed for anxiety. 30 tablet 0 10/16/2015 at Unknown time  . methocarbamol (ROBAXIN) 500 MG tablet Take 1 tablet (500 mg total) by mouth every 6 (six) hours as needed for muscle spasms.   10/16/2015 at Unknown time  . metoprolol (LOPRESSOR) 50 MG tablet Take 1 tablet (50 mg total) by mouth every 6 (six) hours. (Patient taking differently: Take 50 mg by mouth 2 (two) times daily. )   10/17/2015 at 900  . pravastatin (PRAVACHOL) 40 MG tablet Take 40 mg by mouth every evening.   10/16/2015 at Unknown time  . sertraline (ZOLOFT) 100 MG tablet Take 200 mg by mouth every morning.    10/17/2015 at Unknown time  . zolpidem (AMBIEN) 5 MG tablet Take 5 mg by mouth at  bedtime as needed. For sleep  0 10/16/2015 at Unknown time  . albuterol (PROVENTIL) (2.5 MG/3ML) 0.083% nebulizer solution Take 3 mLs (2.5 mg total) by nebulization every 4 (four) hours as needed for wheezing or shortness of breath. (Patient not taking: Reported on 10/17/2015) 75 mL 12 Not Taking  . benazepril (LOTENSIN) 20 MG tablet Take 20 mg by mouth daily. Reported on 10/17/2015   Not Taking at Unknown time  . digoxin (LANOXIN) 0.125 MG tablet Take 1 tablet (0.125 mg total) by mouth daily. (Patient not taking: Reported on 10/17/2015)   Not Taking at Unknown time  . oxyCODONE-acetaminophen (PERCOCET/ROXICET) 5-325 MG per tablet Take 1 tablet by mouth every 4 (four) hours as needed for severe pain. (Patient not taking: Reported on 10/17/2015) 10 tablet 0 Completed Course at Unknown time   Scheduled: . aspirin EC  325 mg Oral BID  . digoxin  0.25 mg Oral Daily  . docusate sodium  100 mg Oral BID  . donepezil  20 mg Oral QHS  . doxepin  150 mg Oral QHS  . gabapentin  400 mg Oral TID  . metoprolol  75 mg Oral BID  . mupirocin ointment  1 application Nasal BID  . pravastatin  40 mg Oral QPM  . senna  1 tablet Oral BID  . sertraline  200 mg Oral q morning - 10a   Continuous: . sodium chloride 125 mL/hr at 10/23/15 0500  . phenylephrine (NEO-SYNEPHRINE) Adult infusion Stopped (10/20/15 1958)   [id number], bisacodyl, LORazepam, magnesium citrate, menthol-cetylpyridinium **OR** phenol, methocarbamol, metoCLOPramide **OR** metoCLOPramide (REGLAN) injection, morphine injection, ondansetron **OR** ondansetron (ZOFRAN) IV, oxyCODONE-acetaminophen, senna-docusate, traZODone  Assesment: She had a hip fracture. She had atrial fib with RVR and that is much improved. I wrote transfer orders yesterday but we did not have a telemetry bed available. There is some potential that she could go back to her skilled care facility tomorrow. I will need to discuss with her cardiology consultants about whether we should  anticoagulate her or not Principal Problem:   Hip fracture, left [facility]) Active Problems:   Essential hypertension   Atrial fibrillation with RVR ([place])   Fall at home   Dementia   Intertrochanteric fracture of left hip ([place])   Intertrochanteric fracture of left femur ([place])    Plan: Transfer out of ICU today    LOS: 6 days   Goodwin Kamphaus L 10/23/2015, 9:37 AM

## 2015-10-24 DIAGNOSIS — F0390 Unspecified dementia without behavioral disturbance: Secondary | ICD-10-CM | POA: Diagnosis not present

## 2015-10-24 DIAGNOSIS — C539 Malignant neoplasm of cervix uteri, unspecified: Secondary | ICD-10-CM | POA: Diagnosis not present

## 2015-10-24 DIAGNOSIS — F4489 Other dissociative and conversion disorders: Secondary | ICD-10-CM | POA: Diagnosis not present

## 2015-10-24 DIAGNOSIS — I481 Persistent atrial fibrillation: Secondary | ICD-10-CM

## 2015-10-24 DIAGNOSIS — R0602 Shortness of breath: Secondary | ICD-10-CM | POA: Diagnosis not present

## 2015-10-24 DIAGNOSIS — W19XXXS Unspecified fall, sequela: Secondary | ICD-10-CM | POA: Diagnosis not present

## 2015-10-24 DIAGNOSIS — I6789 Other cerebrovascular disease: Secondary | ICD-10-CM | POA: Diagnosis not present

## 2015-10-24 DIAGNOSIS — R296 Repeated falls: Secondary | ICD-10-CM | POA: Diagnosis not present

## 2015-10-24 DIAGNOSIS — I959 Hypotension, unspecified: Secondary | ICD-10-CM | POA: Diagnosis not present

## 2015-10-24 DIAGNOSIS — S72009G Fracture of unspecified part of neck of unspecified femur, subsequent encounter for closed fracture with delayed healing: Secondary | ICD-10-CM | POA: Diagnosis not present

## 2015-10-24 DIAGNOSIS — S72142G Displaced intertrochanteric fracture of left femur, subsequent encounter for closed fracture with delayed healing: Secondary | ICD-10-CM | POA: Diagnosis not present

## 2015-10-24 DIAGNOSIS — S72002G Fracture of unspecified part of neck of left femur, subsequent encounter for closed fracture with delayed healing: Secondary | ICD-10-CM | POA: Diagnosis not present

## 2015-10-24 DIAGNOSIS — Z7401 Bed confinement status: Secondary | ICD-10-CM | POA: Diagnosis not present

## 2015-10-24 DIAGNOSIS — I4891 Unspecified atrial fibrillation: Secondary | ICD-10-CM | POA: Diagnosis not present

## 2015-10-24 DIAGNOSIS — S79912A Unspecified injury of left hip, initial encounter: Secondary | ICD-10-CM | POA: Diagnosis not present

## 2015-10-24 DIAGNOSIS — J449 Chronic obstructive pulmonary disease, unspecified: Secondary | ICD-10-CM | POA: Diagnosis not present

## 2015-10-24 DIAGNOSIS — I482 Chronic atrial fibrillation: Secondary | ICD-10-CM | POA: Diagnosis not present

## 2015-10-24 DIAGNOSIS — F419 Anxiety disorder, unspecified: Secondary | ICD-10-CM | POA: Diagnosis not present

## 2015-10-24 DIAGNOSIS — I10 Essential (primary) hypertension: Secondary | ICD-10-CM | POA: Diagnosis not present

## 2015-10-24 DIAGNOSIS — S72002A Fracture of unspecified part of neck of left femur, initial encounter for closed fracture: Secondary | ICD-10-CM | POA: Diagnosis not present

## 2015-10-24 DIAGNOSIS — F331 Major depressive disorder, recurrent, moderate: Secondary | ICD-10-CM | POA: Diagnosis not present

## 2015-10-24 MED ORDER — METOCLOPRAMIDE HCL 5 MG PO TABS: 5.0000 mg | ORAL_TABLET | Freq: Three times a day (TID) | ORAL | Status: AC | PRN

## 2015-10-24 MED ORDER — METOPROLOL TARTRATE 50 MG PO TABS: 75.0000 mg | ORAL_TABLET | Freq: Four times a day (QID) | ORAL | Status: AC

## 2015-10-24 MED ORDER — DIGOXIN 250 MCG PO TABS: 0.2500 mg | ORAL_TABLET | Freq: Every day | ORAL | Status: AC

## 2015-10-24 MED ORDER — ASPIRIN 325 MG PO TBEC: 325.0000 mg | DELAYED_RELEASE_TABLET | Freq: Two times a day (BID) | ORAL | Status: AC

## 2015-10-24 NOTE — Progress Notes (Signed)
Called report to Lesia Hausen, RN at [facility]. Verbalized understanding. Pt to be transferred to facility via ambulance when EMS arrives.

## 2015-10-24 NOTE — NC FL2 (Signed)
[place] MEDICAID FL2 LEVEL OF CARE SCREENING TOOL     IDENTIFICATION  Patient Name: Tami [name] Birthdate: December 23, 1940 Sex: female Admission Date (Current Location): 10/17/2015  [facility] and [place] Number:  [employer] and Address:  [facility] [address], [place]      Provider Number: [id number]  Attending Physician Name and Address:  Sinda Du, MD  Relative Name and Phone Number:       Current Level of Care: Hospital Recommended Level of Care: [place] Prior Approval Number:    Date Approved/Denied:   PASRR Number:  ([id number] )  Discharge Plan: SNF    Current Diagnoses: Patient Active Problem List   Diagnosis Date Noted  . Intertrochanteric fracture of left femur ([place]) 10/19/2015  . Intertrochanteric fracture of left hip ([place])   . Hip fracture, left ([place]) 10/17/2015  . Chronic atrial fibrillation ([place])   . Fracture, intertrochanteric, left femur ([place])   . Clostridium difficile colitis 10/07/2014  . Atrial fibrillation with RVR ([place]) 10/01/2014  . Fall at home 10/01/2014  . Hip fracture, right ([place]) 10/01/2014  . Atrial fibrillation with rapid ventricular response ([place]) 10/01/2014  . Dementia 10/01/2014  . COLONIC POLYPS, ADENOMATOUS, BENIGN 04/13/2009  . 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 BLADDER CANCER 04/21/2008  . Anxiety state 04/21/2008  . DEPRESSION 04/21/2008  . DEGENERATIVE JOINT DISEASE 04/21/2008  . 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 [place] DISEASE, LUMBOSACRAL SPINE 04/21/2008  . INSOMNIA 04/21/2008  . INCONTINENCE 04/21/2008  . HYPERLIPIDEMIA 02/04/2007  . Essential hypertension 02/04/2007  . GERD 02/04/2007  . DIVERTICULOSIS, COLON 02/04/2007  . COLONIC POLYPS, HX OF 02/04/2007    Orientation RESPIRATION BLADDER Height & Weight     Self, Time, Situation, Place  O2 (2L) Incontinent Weight: 194 lb 0.1 oz (88 kg) Height:  5\' 10"  (177.8  cm)  BEHAVIORAL SYMPTOMS/MOOD NEUROLOGICAL BOWEL NUTRITION STATUS      Incontinent Diet (regular)  AMBULATORY STATUS COMMUNICATION OF NEEDS Skin   Extensive Assist Verbally Surgical wounds (left hip)                       Personal Care Assistance Level of Assistance  Bathing, Dressing Bathing Assistance: Maximum assistance   Dressing Assistance: Maximum assistance     Functional Limitations Info             SPECIAL CARE FACTORS FREQUENCY                       Contractures      Additional Factors Info  Allergies   Allergies Info:  (Penicillins)           Current Medications (10/24/2015):  This is the current hospital active medication list Current Facility-Administered Medications  Medication Dose Route Frequency Provider Last Rate Last Dose  . 0.9 %  sodium chloride infusion   Intravenous Continuous Sinda Du, MD 125 mL/hr at 10/24/15 0932    . albuterol (PROVENTIL) (2.5 MG/3ML) 0.083% nebulizer solution 2.5 mg  2.5 mg Nebulization Q4H PRN Roxan Hockey, MD      . aspirin EC tablet 325 mg  325 mg Oral BID Carole Civil, MD   325 mg at 10/24/15 0932  . bisacodyl (DULCOLAX) suppository 10 mg  10 mg Rectal Daily PRN Carole Civil, MD      . digoxin Fonnie Birkenhead) tablet 0.25 mg  0.25 mg Oral Daily Lendon Colonel, NP  0.25 mg at 10/24/15 0931  . docusate sodium (COLACE) capsule 100 mg  100 mg Oral BID Carole Civil, MD   100 mg at 10/24/15 0931  . donepezil (ARICEPT) tablet 20 mg  20 mg Oral QHS Roxan Hockey, MD   20 mg at 10/23/15 2347  . doxepin (SINEQUAN) capsule 150 mg  150 mg Oral QHS Roxan Hockey, MD   150 mg at 10/23/15 2351  . gabapentin (NEURONTIN) capsule 400 mg  400 mg Oral TID Roxan Hockey, MD   400 mg at 10/24/15 0932  . LORazepam (ATIVAN) tablet 0.5 mg  0.5 mg Oral Q6H PRN Courage Emokpae, MD      . magnesium citrate solution 1 Bottle  1 Bottle Oral Once PRN Carole Civil, MD      . menthol-cetylpyridinium  (CEPACOL) lozenge 3 mg  1 lozenge Oral PRN Carole Civil, MD   3 mg at 10/23/15 [id number]   Or  . phenol (CHLORASEPTIC) mouth spray 1 spray  1 spray Mouth/Throat PRN Carole Civil, MD      . methocarbamol (ROBAXIN) tablet 500 mg  500 mg Oral Q6H PRN Roxan Hockey, MD   500 mg at 10/23/15 1611  . metoCLOPramide (REGLAN) tablet 5-10 mg  5-10 mg Oral Q8H PRN Carole Civil, MD       Or  . metoCLOPramide (REGLAN) injection 5-10 mg  5-10 mg Intravenous Q8H PRN Carole Civil, MD      . metoprolol tartrate (LOPRESSOR) tablet 75 mg  75 mg Oral BID Herminio Commons, MD   75 mg at 10/24/15 0931  . morphine 2 MG/ML injection 2 mg  2 mg Intravenous Q3H PRN Roxan Hockey, MD   2 mg at 10/23/15 1307  . ondansetron (ZOFRAN) tablet 4 mg  4 mg Oral Q6H PRN Roxan Hockey, MD       Or  . ondansetron (ZOFRAN) injection 4 mg  4 mg Intravenous Q6H PRN Roxan Hockey, MD      . oxyCODONE-acetaminophen (PERCOCET/ROXICET) 5-325 MG per tablet 1 tablet  1 tablet Oral Q4H PRN Carole Civil, MD   1 tablet at 10/23/15 2034  . phenylephrine (NEO-SYNEPHRINE) 10 mg in dextrose 5 % 250 mL (0.04 mg/mL) infusion  30-200 mcg/min Intravenous Continuous Marshell Garfinkel, MD   Stopped at 10/20/15 1958  . pravastatin (PRAVACHOL) tablet 40 mg  40 mg Oral QPM Roxan Hockey, MD   40 mg at 10/23/15 1609  . senna (SENOKOT) tablet 8.6 mg  1 tablet Oral BID Roxan Hockey, MD   8.6 mg at 10/24/15 0932  . senna-docusate (Senokot-S) tablet 1 tablet  1 tablet Oral QHS PRN Carole Civil, MD      . sertraline (ZOLOFT) tablet 200 mg  200 mg Oral q morning - 10a Roxan Hockey, MD   200 mg at 10/24/15 0932  . traZODone (DESYREL) tablet 25 mg  25 mg Oral QHS PRN Roxan Hockey, MD   25 mg at 10/18/15 2209     Discharge Medications: Please see discharge summary for a list of discharge medications.  Relevant Imaging Results:  Relevant Lab Results:   Additional Information    Ramelo Oetken, Clydene Pugh,  LCSW

## 2015-10-24 NOTE — Progress Notes (Signed)
Primary Cardiologist: Dr. Kate Sable  Cardiology Specific Problem List: 1. Atrial fib with RVR 2. Hypotension  Subjective:    She is without complaints. States hip feels better.   Objective:   Temp:  [97 F (36.1 C)-98.3 F (36.8 C)] 97 F (36.1 C) (02/27 0400) Pulse Rate:  [28-88] 79 (02/27 0700) Resp:  [0-21] 0 (02/27 0700) BP: (89-150)/(55-105) 142/85 mmHg (02/27 0700) SpO2:  [94 %-98 %] 98 % (02/27 0700) FiO2 (%):  [28 %] 28 % (02/27 0600) Weight:  [194 lb 0.1 oz (88 kg)] 194 lb 0.1 oz (88 kg) (02/27 0500) Last BM Date: 10/22/15  Filed Weights   10/22/15 0400 10/23/15 0400 10/24/15 0500  Weight: 185 lb 3 oz (84 kg) 185 lb 6.5 oz (84.1 kg) 194 lb 0.1 oz (88 kg)    Intake/Output Summary (Last 24 hours) at 10/24/15 0735 Last data filed at 10/23/15 1500  Gross per 24 hour  Intake   1610 ml  Output    400 ml  Net   1210 ml    Telemetry: Atrial fibrillation, with some slow ventricular response overnight with some O2 desaturations. Rates between 47 bpm to 80 bpm. Average in the 60's.   Exam:  General: No acute distress.  Lungs: Clear to auscultation, nonlabored.   Cardiac: No elevated JVP or bruits. IRRR, 1/6 systolic murmur,   Abdomen: Normoactive bowel sounds, nontender, nondistended.  Extremities: No pitting edema, distal pulses full. Dressing to left hip, with bilateral SCD's   Neuropsychiatric: Alert and with some confusion.   Lab Results:  Basic Metabolic Panel:  Recent Labs Lab 10/18/15 0620 10/20/15 0442 10/21/15 0515  NA 140 142 137  K 4.1 5.1 4.3  CL 102 109 106  CO2 30 30 25   GLUCOSE 95 86 92  BUN 22* 17 12  CREATININE 0.88 0.78 0.75  CALCIUM 8.5* 7.7* 7.7*     CBC:  Recent Labs Lab 10/20/15 0442 10/21/15 0515 10/22/15 0435  WBC 5.9 6.7 7.4  HGB 10.8* 10.2* 10.3*  HCT 34.5* 32.7* 32.5*  MCV 83.9 83.4 83.3  PLT 162 167 186    Cardiac Enzymes:  Recent Labs Lab 10/20/15 0854 10/20/15 1622 10/20/15 2208   TROPONINI 0.06* 0.05* 0.07*    Coagulation:  Recent Labs Lab 10/17/15 1524  INR 1.53*    Medications:   Scheduled Medications: . aspirin EC  325 mg Oral BID  . digoxin  0.25 mg Oral Daily  . docusate sodium  100 mg Oral BID  . donepezil  20 mg Oral QHS  . doxepin  150 mg Oral QHS  . gabapentin  400 mg Oral TID  . metoprolol  75 mg Oral BID  . pravastatin  40 mg Oral QPM  . senna  1 tablet Oral BID  . sertraline  200 mg Oral q morning - 10a    Infusions: . sodium chloride 125 mL/hr at 10/23/15 1259  . phenylephrine (NEO-SYNEPHRINE) Adult infusion Stopped (10/20/15 1958)    PRN Medications: albuterol, bisacodyl, LORazepam, magnesium citrate, menthol-cetylpyridinium **OR** phenol, methocarbamol, metoCLOPramide **OR** metoCLOPramide (REGLAN) injection, morphine injection, ondansetron **OR** ondansetron (ZOFRAN) IV, oxyCODONE-acetaminophen, senna-docusate, traZODone   Assessment and Plan:   1. Atrial fib with RVR: Heart rate is currently well controlled. She is now on metoprolol 75 mg BID and digoxin 0.25 mg daily.  Creatinine 0.75. She is not on anticoagulation at this point. CHADS VASC Score is 6 and had previously been on Eliquis.  2. Hypotension: She was temporarily on  Neo-Synephrine gtt last week due to hypotension in the setting of atrial fib with RVR post-operatively. She is currently stable in regards to her BP. Would not add any additional medications at this time.   3. S/P Left Hip Fracture with ORIF: Plans to move to SNF today for rehab. Severely deconditioned currently and has refused hospital PT.   Phill Myron. Lawrence NP [place]  10/24/2015, 7:35 AM    Attending note:  Patient seen and examined. I reviewed hospital course and recent cardiology notes. I modified above note by Ms. Lawrence NP. Patient of Dr. Bronson Ing with PAF, now persistent atrial fibrillation status post recent hip fracture and ORIF. Hypotension has resolved and plan is for heart rate control  strategy. She had previously been on Eliquis, although this has been stopped temporarily.  She is clinically stable this morning. Heart rate 70s and SBP 120-150. Cardiac with irregularly irregular rhythm, lungs clear. Hgb 10.3, platelets 186, creatinine 0.7, potassium 4.3. Echocardiogram shows LVEF 55-60% with moderate LAE  Persistent atrial fibrillation to be managed with strategy of heart rate control. She is on ASA, Dr. Bronson Ing recommended holding Eliquis for now - with CHADSVASC score of 6 need to discuss timing of resumption, although history of falls increase bleeding risk. Continue current doses of digoxin and Lopressor. We will schedule office followup with Dr. Bronson Ing or APP in the next 1-2 weeks and sign off for now. SNF pending.  Satira Sark, M.D., F.A.C.C.

## 2015-10-24 NOTE — Progress Notes (Signed)
EMS at bedside for transport, husband and patient belongings at bedside. No questions or complaints at this time. Patient packet with EMS transporter, Demetrius Revel. Tami [name] L

## 2015-10-24 NOTE — Care Management Important Message (Signed)
Important Message  Patient Details  Name: ALESHA KROGMANN MRN: [id number] Date of Birth: November 12, 1940   Medicare Important Message Given:  Yes    Sherald Barge, RN 10/24/2015, 4:10 PM

## 2015-10-24 NOTE — Care Management Note (Signed)
Case Management Note  Patient Details  Name: ALAYSSA SALVATORI MRN: [id number] Date of Birth: 12-Jun-1941  Expected Discharge Date:  10/21/15               Expected Discharge Plan:  [place]  In-House Referral:  Clinical Social Work  Discharge planning Services  CM Consult  Post Acute Care Choice:    Choice offered to:     DME Arranged:    DME Agency:     HH Arranged:    [place] Agency:     Status of Service:  Completed, signed off  Medicare Important Message Given:  Yes Date Medicare IM Given:    Medicare IM give by:    Date Additional Medicare IM Given:    Additional Medicare Important Message give by:     If discussed at [place] of Stay Meetings, dates discussed:    Additional Comments: Pt discharging to SNF today. CSW has arranged for placement and transportation. No CM needs.  Sherald Barge, RN 10/24/2015, 4:11 PM

## 2015-10-24 NOTE — Progress Notes (Signed)
PT Cancellation Note  Patient Details Name: Tami [name] MRN: [id number] DOB: [birth year]   Cancelled Treatment:    Reason Eval/Treat Not Completed: Other (comment). Chart reviewed, RN consulted. Attempted PT treatment, however EMS is enroute to transfer patient, DC pending. Will hold treatment at this time as patient prepares for exit. Pt eating ice cream with husband at bedside.    5:04 PM, 10/24/2015 Etta Grandchild, PT, DPT PRN Physical Therapist at [place] License # [id number] (wireless)  [phone] (mobile)

## 2015-10-24 NOTE — Clinical Social Work Placement (Signed)
   CLINICAL SOCIAL WORK PLACEMENT  NOTE  Date:  10/24/2015  Patient Details  Name: Tami [name] MRN: [id number] Date of Birth: 1941/02/09  Clinical Social Work is seeking post-discharge placement for this patient at the [place] level of care (*CSW will initial, date and re-position this form in  chart as items are completed):  Yes   Patient/family provided with [place] Work Department's list of facilities offering this level of care within the geographic area requested by the patient (or if unable, by the patient's family).  Yes   Patient/family informed of their freedom to choose among providers that offer the needed level of care, that participate in Medicare, Medicaid or managed care program needed by the patient, have an available bed and are willing to accept the patient.  Yes   Patient/family informed of [facility]'s ownership interest in [facility] and [facility], as well as of the fact that they are under no obligation to receive care at these facilities.  PASRR submitted to EDS on 10/24/15     PASRR number received on 10/24/15     Existing PASRR number confirmed on       FL2 transmitted to all facilities in geographic area requested by pt/family on 10/24/15     FL2 transmitted to all facilities within larger geographic area on       Patient informed that his/her managed care company has contracts with or will negotiate with certain facilities, including the following:        Yes   Patient/family informed of bed offers received.  Patient chooses bed at [facility]     Physician recommends and patient chooses bed at      Patient to be transferred to [facility] on 10/24/15.  Patient to be transferred to facility by [facility]     Patient family notified on 10/24/15 of transfer.  Name of family member notified:  spouse  by patient's nurse.      PHYSICIAN       Additional Comment:     _______________________________________________ Ihor Gully, LCSW 10/24/2015, 6:40 PM

## 2015-10-24 NOTE — Discharge Summary (Signed)
Physician Discharge Summary  Patient ID: Tami [name] MRN: [id number] DOB/AGE: 1941-02-25 75 y.o. Primary Care Physician:Milus Fritze L, MD Admit date: 10/17/2015 Discharge date: 10/24/2015    Discharge Diagnoses:   Principal Problem:   Hip fracture, left [facility]) Active Problems:   Essential hypertension   Atrial fibrillation with RVR ([place])   Fall at home   Dementia   Intertrochanteric fracture of left hip ([place])   Intertrochanteric fracture of left femur ([facility])     Medication List    STOP taking these medications        apixaban 5 MG Tabs tablet  Commonly known as:  ELIQUIS     diltiazem 120 MG 24 hr capsule  Commonly known as:  CARDIZEM CD     oxyCODONE-acetaminophen 5-325 MG tablet  Commonly known as:  PERCOCET/ROXICET     zolpidem 5 MG tablet  Commonly known as:  AMBIEN      TAKE these medications        albuterol (2.5 MG/3ML) 0.083% nebulizer solution  Commonly known as:  PROVENTIL  Take 3 mLs (2.5 mg total) by nebulization every 4 (four) hours as needed for wheezing or shortness of breath.     aspirin 325 MG EC tablet  Take 1 tablet (325 mg total) by mouth 2 (two) times daily.     benazepril 20 MG tablet  Commonly known as:  LOTENSIN  Take 20 mg by mouth daily. Reported on 10/17/2015     CALCIUM PO  Take 2 tablets by mouth every morning.     digoxin 0.25 MG tablet  Commonly known as:  LANOXIN  Take 1 tablet (0.25 mg total) by mouth daily.     donepezil 10 MG tablet  Commonly known as:  ARICEPT  Take 20 mg by mouth at bedtime.     doxepin 150 MG capsule  Commonly known as:  SINEQUAN  Take 150 mg by mouth at bedtime.     gabapentin 400 MG capsule  Commonly known as:  NEURONTIN  Take 400 mg by mouth 3 (three) times daily.     HYDROcodone-acetaminophen 10-325 MG tablet  Commonly known as:  NORCO  Take 1 tablet by mouth 4 (four) times daily.     LORazepam 0.5 MG tablet  Commonly known as:  ATIVAN  Take 1 tablet (0.5 mg total) by mouth every 6  (six) hours as needed for anxiety.     methocarbamol 500 MG tablet  Commonly known as:  ROBAXIN  Take 1 tablet (500 mg total) by mouth every 6 (six) hours as needed for muscle spasms.     metoCLOPramide 5 MG tablet  Commonly known as:  REGLAN  Take 1-2 tablets (5-10 mg total) by mouth every 8 (eight) hours as needed for nausea (if ondansetron (ZOFRAN) ineffective.).     metoprolol 50 MG tablet  Commonly known as:  LOPRESSOR  Take 1.5 tablets (75 mg total) by mouth every 6 (six) hours.     pravastatin 40 MG tablet  Commonly known as:  PRAVACHOL  Take 40 mg by mouth every evening.     sertraline 100 MG tablet  Commonly known as:  ZOLOFT  Take 200 mg by mouth every morning.        Discharged Condition: Improved    Consults: Orthopedics/cardiology  Significant Diagnostic Studies: Dg Chest 1 View  10/17/2015  CLINICAL DATA:  Pain.  Fall.  Initial encounter. EXAM: CHEST 1 VIEW COMPARISON:  09/30/2014 FINDINGS: The cardiac silhouette remains mildly enlarged. The lungs are  hypoinflated with chronic diffuse interstitial prominence, not significantly changed. Slightly increased density is present in the lung bases compared to the prior study. No overt pulmonary edema, sizable pleural effusion, or pneumothorax is identified. No acute osseous abnormality is identified. IMPRESSION: New, mild bibasilar opacities, likely atelectasis. Electronically Signed   By: Logan Bores M.D.   On: 10/17/2015 16:10   Dg Knee 1-2 Views Left  10/17/2015  CLINICAL DATA:  [place] Friday. Lt hip pain. Pt states no knee pain EXAM: LEFT KNEE - 1-2 VIEW COMPARISON:  None. FINDINGS: Small marginal spurs about the medial compartment. Narrowing of the articular cartilage. Negative for fracture or dislocation. No definite effusion. Patchy femoral-popliteal arterial calcifications. IMPRESSION: 1. Mild medial compartment degenerative spurring. No fracture or dislocation. Electronically Signed   By: Lucrezia Europe M.D.   On:  10/17/2015 16:11   Dg Chest Port 1 View  10/20/2015  CLINICAL DATA:  Left sided PICC line placement. EXAM: PORTABLE CHEST 1 VIEW COMPARISON:  Chest x-ray, same date. FINDINGS: Of the left-sided PICC line tip is in the distal SVC just above the region of the cavoatrial junction. No complicating features are demonstrated. The heart and lungs are unchanged. IMPRESSION: Well-positioned PICC line with the tip in the distal SVC. Electronically Signed   By: Marijo Sanes M.D.   On: 10/20/2015 14:11   Dg Chest Port 1 View  10/20/2015  CLINICAL DATA:  75 year old female with shortness of breath and recent hip fracture. EXAM: PORTABLE CHEST 1 VIEW COMPARISON:  10/17/2015 FINDINGS: Cardiomediastinal silhouette is unchanged. This is a mildly low volume film with mild bibasilar atelectasis. There is no evidence of focal airspace disease, pulmonary edema, suspicious pulmonary nodule/mass, pleural effusion, or pneumothorax. No acute bony abnormalities are identified. IMPRESSION: No significant change -mild bibasilar atelectasis. Electronically Signed   By: Margarette Canada M.D.   On: 10/20/2015 10:17   Dg Hip Operative Unilat With Pelvis Left  10/19/2015  CLINICAL DATA:  Open reduction internal fixation left hip EXAM: OPERATIVE left HIP (WITH PELVIS IF PERFORMED) 10 VIEWS TECHNIQUE: Fluoroscopic spot image(s) were submitted for interpretation post-operatively. COMPARISON:  None. FINDINGS: The patient is status post open reduction internal fixation of left intertrochanteric femoral fracture. There is intra medullary rod in proximal left femur. Metallic locking pin and noted along the left femoral neck. There is anatomic alignment. A locking screw is noted in proximal shaft of left femur. Fluoroscopy time was 1 minutes 36 seconds. Please see the operative report. IMPRESSION: Status post open reduction internal fixation of left intertrochanteric femoral fracture. Intra medullary rod and metallic fixation pin noted noted in  proximal left femur. There is anatomic alignment. Please see the operative report. Electronically Signed   By: Lahoma Crocker M.D.   On: 10/19/2015 12:35   Dg Hip Unilat With Pelvis 2-3 Views Left  10/17/2015  CLINICAL DATA:  She fell on Friday and landed on her lt side. She asked to come to the hospital all weekend and her husband refused to bring her. She did ambulate with a walker over the weekend. Hx of rt hip surgery about 1 yr ago. EXAM: DG HIP (WITH OR WITHOUT PELVIS) 2-3V LEFT COMPARISON:  09/30/2014 FINDINGS: Interval Previous sliding screw and plate fixation across the right femoral neck, hardware incompletely visualized. Acute comminuted left intertrochanteric fracture, impacted with a varus angulation deformity. Bony pelvis intact.  Patchy iliofemoral arterial calcifications. IMPRESSION: 1. Comminuted impacted left intertrochanteric femur fracture. Electronically Signed   By: Lucrezia Europe M.D.   On:  10/17/2015 16:13    Lab Results: Basic Metabolic Panel: No results for input(s): NA, K, CL, CO2, GLUCOSE, BUN, CREATININE, CALCIUM, MG, PHOS in the last 72 hours. Liver Function Tests: No results for input(s): AST, ALT, ALKPHOS, BILITOT, PROT, ALBUMIN in the last 72 hours.   CBC:  Recent Labs  10/22/15 0435  WBC 7.4  HGB 10.3*  HCT 32.5*  MCV 83.3  PLT 186    Recent Results (from the past 240 hour(s))  Surgical PCR screen     Status: Abnormal   Collection Time: 10/18/15  5:00 AM  Result Value Ref Range Status   MRSA, PCR NEGATIVE NEGATIVE Final   Staphylococcus aureus POSITIVE (A) NEGATIVE Final    Comment:        The Xpert SA Assay (FDA approved for NASAL specimens in patients over 53 years of age), is one component of a comprehensive surveillance program.  Test performance has been validated by [facility] for patients greater than or equal to 43 year old. It is not intended to diagnose infection nor to guide or monitor treatment. RESULT CALLED TO, READ BACK BY AND  VERIFIED WITH: EVERETT,R. AT [id number] ON 10/18/2015 BY [facility] Course: This is a 75 year old who has been a resident at a skilled care facility and who suffered a fall 2 or 3 days prior to admission. Initially it was thought that she had no injury but with further evaluation it turned out that she had a hip fracture. This was the left hip. Consultation was obtained with Dr. Aline Brochure from orthopedics and she underwent surgery for that. She developed atrial fibrillation with rapid ventricular response postoperatively and was hypotensive. She required amiodarone and a brief course of Neo-Synephrine. She improved her medications were altered cardiology was consulted and medications were adjusted further. By the time of discharge her heart rates in the 60-80 range blood pressure better controlled at about 140/70 she is able to stand and she is ready for transfer back to the skilled care facility  Discharge Exam: Blood pressure 142/85, pulse 79, temperature 97 F (36.1 C), temperature source Oral, resp. rate 0, height 5\' 10"  (1.778 m), weight 88 kg (194 lb 0.1 oz), SpO2 98 %. She's awake and alert. She is confused as always. Her heart is irregular. Her abdomen is soft.  Disposition: Return to skilled care facility. She is going to have PT OT and speech as needed. She will be on a heart healthy diet. Medications have been adjusted. If her heart rate gets elevated she'll need further adjustment of medications. She may need oxygen for now.      Discharge Instructions    Discharge to SNF when bed available    Complete by:  As directed              Signed: Cassanda Walmer L   10/24/2015, 8:54 AM

## 2015-10-24 NOTE — Progress Notes (Signed)
Subjective: She feels okay. She has no new complaints.  Objective: Vital signs in last 24 hours: Temp:  [97 F (36.1 C)-97.6 F (36.4 C)] 97 F (36.1 C) (02/27 0400) Pulse Rate:  [46-88] 79 (02/27 0700) Resp:  [0-21] 0 (02/27 0700) BP: (89-150)/(55-105) 142/85 mmHg (02/27 0700) SpO2:  [94 %-98 %] 98 % (02/27 0700) FiO2 (%):  [28 %] 28 % (02/27 0600) Weight:  [88 kg (194 lb 0.1 oz)] 88 kg (194 lb 0.1 oz) (02/27 0500) Weight change: 3.9 kg (8 lb 9.6 oz) Last BM Date: 10/22/15  Intake/Output from previous day: 02/26 0701 - 02/27 0700 In: 1610 [P.O.:360; I.V.:1250] Out: 400 [Urine:400]  PHYSICAL EXAM General appearance: alert, cooperative and Confused Resp: clear to auscultation bilaterally Cardio: irregularly irregular rhythm GI: soft, non-tender; bowel sounds normal; no masses,  no organomegaly Extremities: She has now had surgery on both hips  Lab Results:  No results found for this or any previous visit (from the past 48 hour(s)).  ABGS No results for input(s): PHART, PO2ART, TCO2, HCO3 in the last 72 hours.  Invalid input(s): PCO2 CULTURES Recent Results (from the past 240 hour(s))  Surgical PCR screen     Status: Abnormal   Collection Time: 10/18/15  5:00 AM  Result Value Ref Range Status   MRSA, PCR NEGATIVE NEGATIVE Final   Staphylococcus aureus POSITIVE (A) NEGATIVE Final    Comment:        The Xpert SA Assay (FDA approved for NASAL specimens in patients over 71 years of age), is one component of a comprehensive surveillance program.  Test performance has been validated by [facility] for patients greater than or equal to 78 year old. It is not intended to diagnose infection nor to guide or monitor treatment. RESULT CALLED TO, READ BACK BY AND VERIFIED WITH: EVERETT,R. AT 1317 ON 10/18/2015 BY BAUGHAM,M.    Studies/Results: No results found.  Medications:  Prior to Admission:  Prescriptions prior to admission  Medication Sig Dispense Refill Last  Dose  . apixaban (ELIQUIS) 5 MG TABS tablet Take 1 tablet (5 mg total) by mouth 2 (two) times daily. 60 tablet  10/17/2015 at Unknown time  . CALCIUM PO Take 2 tablets by mouth every morning.   10/17/2015 at Unknown time  . diltiazem (CARDIZEM CD) 120 MG 24 hr capsule Take 1 capsule (120 mg total) by mouth daily.   10/17/2015 at Unknown time  . donepezil (ARICEPT) 10 MG tablet Take 20 mg by mouth at bedtime.   10/16/2015 at Unknown time  . doxepin (SINEQUAN) 150 MG capsule Take 150 mg by mouth at bedtime.   11 10/16/2015 at Unknown time  . gabapentin (NEURONTIN) 400 MG capsule Take 400 mg by mouth 3 (three) times daily.    10/17/2015 at Unknown time  . HYDROcodone-acetaminophen (NORCO) 10-325 MG per tablet Take 1 tablet by mouth 4 (four) times daily.   10/17/2015 at Unknown time  . LORazepam (ATIVAN) 0.5 MG tablet Take 1 tablet (0.5 mg total) by mouth every 6 (six) hours as needed for anxiety. 30 tablet 0 10/16/2015 at Unknown time  . methocarbamol (ROBAXIN) 500 MG tablet Take 1 tablet (500 mg total) by mouth every 6 (six) hours as needed for muscle spasms.   10/16/2015 at Unknown time  . metoprolol (LOPRESSOR) 50 MG tablet Take 1 tablet (50 mg total) by mouth every 6 (six) hours. (Patient taking differently: Take 50 mg by mouth 2 (two) times daily. )   10/17/2015 at 900  .  pravastatin (PRAVACHOL) 40 MG tablet Take 40 mg by mouth every evening.   10/16/2015 at Unknown time  . sertraline (ZOLOFT) 100 MG tablet Take 200 mg by mouth every morning.    10/17/2015 at Unknown time  . zolpidem (AMBIEN) 5 MG tablet Take 5 mg by mouth at bedtime as needed. For sleep  0 10/16/2015 at Unknown time  . albuterol (PROVENTIL) (2.5 MG/3ML) 0.083% nebulizer solution Take 3 mLs (2.5 mg total) by nebulization every 4 (four) hours as needed for wheezing or shortness of breath. (Patient not taking: Reported on 10/17/2015) 75 mL 12 Not Taking  . benazepril (LOTENSIN) 20 MG tablet Take 20 mg by mouth daily. Reported on 10/17/2015   Not  Taking at Unknown time  . digoxin (LANOXIN) 0.125 MG tablet Take 1 tablet (0.125 mg total) by mouth daily. (Patient not taking: Reported on 10/17/2015)   Not Taking at Unknown time  . oxyCODONE-acetaminophen (PERCOCET/ROXICET) 5-325 MG per tablet Take 1 tablet by mouth every 4 (four) hours as needed for severe pain. (Patient not taking: Reported on 10/17/2015) 10 tablet 0 Completed Course at Unknown time   Scheduled: . aspirin EC  325 mg Oral BID  . digoxin  0.25 mg Oral Daily  . docusate sodium  100 mg Oral BID  . donepezil  20 mg Oral QHS  . doxepin  150 mg Oral QHS  . gabapentin  400 mg Oral TID  . metoprolol  75 mg Oral BID  . pravastatin  40 mg Oral QPM  . senna  1 tablet Oral BID  . sertraline  200 mg Oral q morning - 10a   Continuous: . sodium chloride 125 mL/hr at 10/23/15 1259  . phenylephrine (NEO-SYNEPHRINE) Adult infusion Stopped (10/20/15 1958)   [id number], bisacodyl, LORazepam, magnesium citrate, menthol-cetylpyridinium **OR** phenol, methocarbamol, metoCLOPramide **OR** metoCLOPramide (REGLAN) injection, morphine injection, ondansetron **OR** ondansetron (ZOFRAN) IV, oxyCODONE-acetaminophen, senna-docusate, traZODone  Assesment: She had a left hip fracture. She developed atrial fibrillation with rapid ventricular response and was hypotensive for a period of time. She was given amiodarone and improved. She is off pressors. Her heart rate is better controlled. I think she is ready for transfer back to skilled care facility Principal Problem:   Hip fracture, left [facility]) Active Problems:   Essential hypertension   Atrial fibrillation with RVR ([place])   Fall at home   Dementia   Intertrochanteric fracture of left hip ([place])   Intertrochanteric fracture of left femur ([place])    Plan: Transfer back to skilled care facility today    LOS: 7 days   Inaaya Vellucci L 10/24/2015, 8:46 AM

## 2015-10-24 NOTE — Clinical Social Work Note (Signed)
Clinical Social Work Assessment  Patient Details  Name: Tami [name] MRN: [id number] Date of Birth: 08/10/1941  Date of referral:  10/24/15               Reason for consult:  Facility Placement                Permission sought to share information with:    Permission granted to share information::     Name::        Agency::     Relationship::     Contact Information:     Housing/Transportation Living arrangements for the past 2 months:  [place] of Information:  Patient Patient Interpreter Needed:  None Criminal Activity/Legal Involvement Pertinent to Current Situation/Hospitalization:  No - Comment as needed Significant Relationships:  Adult Children, Spouse Lives with:  Spouse Do you feel safe going back to the place where you live?  Yes Need for family participation in patient care:  Yes (Comment)  Care giving concerns:  None identified.  Social Worker assessment / plan: Patient identified that she resides in the home with her husband.  She stated that at baseline she uses a cane and her friends assists her with ADLs as needed. CSW provided SNF list. Patient chose, [facility], [facility] and [facility].  Patient chose bed at  [facility]. CSW sent discharge clinicals to facility and arranged transportation to the facility with [facility]. Patient nurse notified patient's husband of discharge plan to [facility] and transportation through [facility].    Employment status:  Retired Nurse, adult PT Recommendations:  [place] / Referral to community resources:  [place]  Patient/Family's Response to care:  Agreeable to SNF.  Patient/Family's Understanding of and Emotional Response to Diagnosis, Current Treatment, and Prognosis:  Patient understands her diagnosis, treatment and prognosis.   Emotional Assessment Appearance:  Developmentally appropriate Attitude/Demeanor/Rapport:   (Cooperative) Affect (typically  observed):  Accepting Orientation:  Oriented to Self, Oriented to Place, Oriented to  Time, Oriented to Situation Alcohol / Substance use:  Not Applicable Psych involvement (Current and /or in the community):     Discharge Needs  Concerns to be addressed:  Discharge Planning Concerns Readmission within the last 30 days:  No Current discharge risk:  None Barriers to Discharge:  No Barriers Identified   Ihor Gully, LCSW 10/24/2015, 6:35 PM

## 2015-10-24 NOTE — Evaluation (Signed)
Occupational Therapy Evaluation Patient Details Name: Tami [name] MRN: [id number] DOB: [birth year] Today's Date: 10/24/2015    History of Present Illness 75yo white female who sustained a fall in bathroom at home on Friday 2/17, and came to [facility] on 2/20 via EMS after 3d of sustained pain with AMB. Husband refused to bring pt to ED despite c/o pain and request to come in. Pt underwent L hip ORIF and moved to SDU after hypotension issues. PMH: afib c RVR, R THA, T TKA.  Medical record attests to multiple falls, pt attesting 2-3/month. Most recent Tr-I .05 --> 0.7, however Dr. Luan Pulling documenting suspected demand ischemia as causal factor.    Clinical Impression   Patient in bed upon therapy arrival and agreeable to getting up for breakfast. Patient reports that she needs to use the bathroom. BSC was brought over. Patient transferred with increased anxiety, increased VC for safety and technique, and a heavy Max Assist to Total. Recommend return to SNF for OT services to increase functional performance with ADL and transfer performance.     Follow Up Recommendations  SNF    Equipment Recommendations  None recommended by OT       Precautions / Restrictions Precautions Precautions: Fall Restrictions Weight Bearing Restrictions: Yes LLE Weight Bearing: Weight bearing as tolerated      Mobility Bed Mobility Overal bed mobility: Needs Assistance Bed Mobility: Supine to Sit     Supine to sit: HOB elevated;Total assist        Transfers Overall transfer level: Needs assistance Equipment used: None Transfers: Stand Pivot Transfers   Stand pivot transfers: Total assist       General transfer comment: Pt limited by pain, weight bearing through Rt LE only         ADL Overall ADL's : Needs assistance/impaired                     Lower Body Dressing: Total assistance;Bed level   Toilet Transfer: Total assistance;BSC;Stand-pivot                              Pertinent Vitals/Pain Pain Assessment: No/denies pain (when supine in bed)     Hand Dominance Right   Extremity/Trunk Assessment Upper Extremity Assessment Upper Extremity Assessment: Overall WFL for tasks assessed   Lower Extremity Assessment Lower Extremity Assessment: Defer to PT evaluation       Communication Communication Communication: No difficulties   Cognition Arousal/Alertness: Awake/alert Behavior During Therapy: Anxious Overall Cognitive Status: Within Functional Limits for tasks assessed                                Home Living Family/patient expects to be discharged to:: Skilled nursing facility         Entrance Stairs-Number of Steps: 5                              Prior Functioning/Environment Level of Independence: Needs assistance                      OT Goals(Current goals can be found in the care plan section)    OT Frequency:     Barriers to D/C:               End of Session Equipment Utilized During  Treatment: Gait belt Nurse Communication: Mobility status;Other (comment) (recommendation to use BSC after breakfast)  Activity Tolerance: Patient limited by pain Patient left:     Time: [id number] OT Time Calculation (min): 35 min Charges:  OT General Charges $OT Visit: 1 Procedure OT Evaluation $OT Eval Low Complexity: 1 Procedure G-Codes:    Ailene Ravel, OTR/L,CBIS  [phone]  10/24/2015, 9:20 AM

## 2015-10-25 DIAGNOSIS — I6789 Other cerebrovascular disease: Secondary | ICD-10-CM | POA: Diagnosis not present

## 2015-10-25 DIAGNOSIS — I10 Essential (primary) hypertension: Secondary | ICD-10-CM | POA: Diagnosis not present

## 2015-10-25 DIAGNOSIS — I482 Chronic atrial fibrillation: Secondary | ICD-10-CM | POA: Diagnosis not present

## 2015-10-25 NOTE — Progress Notes (Signed)
NAMEJENNILYN, [name]                ACCOUNT NO.:  [id number]  MEDICAL RECORD NO.:  [id number]  LOCATION:                                 FACILITY:  PHYSICIAN:  Steele Luan Pulling, M.D.DATE OF BIRTH:  01/24/1941  DATE OF PROCEDURE:  10/16/2015 DATE OF DISCHARGE:                                PROGRESS NOTE   SUBJECTIVE:  Ms. Tami [name] is overall about the same.  She is still in chronic atrial fib, and she is chronically anticoagulated.  No new events have been noted.  PHYSICAL EXAMINATION:  GENERAL:  She is awake and alert.  She is confused as always. HEENT:  She has what looks like a scratch across her forehead, and apparently, she scraped her forehead a couple of days ago. CHEST:  Clear. HEART:  Irregular.  ASSESSMENT AND PLAN:  She has chronic atrial fib.  She has had a hip fracture.  She is doing okay.  She has a scrape on her forehead, but I do not think that needs any other attention right now.  She did not seem to have a significant injury by history.     Isaac Lacson L. Luan Pulling, M.D.     ELH/MEDQ  D:  10/24/2015  T:  10/24/2015  Job:  [id number]

## 2015-11-08 ENCOUNTER — Telehealth: Payer: Self-pay | Admitting: Orthopedic Surgery

## 2015-11-08 ENCOUNTER — Encounter: Payer: Self-pay | Admitting: Adult Health

## 2015-11-08 ENCOUNTER — Ambulatory Visit (INDEPENDENT_AMBULATORY_CARE_PROVIDER_SITE_OTHER): Payer: Commercial Managed Care - HMO | Admitting: Adult Health

## 2015-11-08 VITALS — BP 130/80 | HR 53 | Ht 65.0 in | Wt 172.0 lb

## 2015-11-08 DIAGNOSIS — I4819 Other persistent atrial fibrillation: Secondary | ICD-10-CM

## 2015-11-08 DIAGNOSIS — I481 Persistent atrial fibrillation: Secondary | ICD-10-CM

## 2015-11-08 DIAGNOSIS — I10 Essential (primary) hypertension: Secondary | ICD-10-CM | POA: Diagnosis not present

## 2015-11-08 NOTE — Telephone Encounter (Signed)
Call received from [facility] at [facility] to inquire about scheduling appointment for patient, and also regarding orders. States just came to their attention, due to discharge summary issued by Dr Luan Pulling - that patient had recent left hip surgery (internal fixation) 10/19/15. States patient still has staples in. Please advise.  We have gone ahead and scheduled the hospital follow up/post op office appointment for 11/21/15, which is the date that facility can provide transportation. Please also provide order for Xrays prior, and Inez Catalina states they will have these done at [facility] by 10/20/15. Ph# for facility, [address], [phone]. Alternate ph# (regarding appointment) [phone].

## 2015-11-08 NOTE — Progress Notes (Deleted)
Name: Tami [name]    DOB: 03-22-1941  Age: 75 y.o.  MR#: [id number]       PCP:  Alonza Bogus, MD      Insurance: Payor: [employer] MEDICARE / Plan: [place] THN/NTSP / Product Type: *No Product type* /   CC:    Chief Complaint  Patient presents with  . Atrial Fibrillation    VS Filed Vitals:   11/08/15 1310  BP: 130/80  Pulse: 53  Height: 5\' 5"  (1.651 m)  Weight: 172 lb (78.019 kg)  SpO2: 97%    Weights Current Weight  11/08/15 172 lb (78.019 kg)  10/24/15 194 lb 0.1 oz (88 kg)  11/15/14 180 lb (81.647 kg)    Blood Pressure  BP Readings from Last 3 Encounters:  11/08/15 130/80  10/24/15 155/88  11/15/14 118/77     Admit date:  (Not on file) Last encounter with RMR:  Visit date not found   Allergy Penicillins  Current Outpatient Prescriptions  Medication Sig Dispense Refill  . albuterol (PROVENTIL) (2.5 MG/3ML) 0.083% nebulizer solution Take 3 mLs (2.5 mg total) by nebulization every 4 (four) hours as needed for wheezing or shortness of breath. 75 mL 12  . aspirin EC 325 MG EC tablet Take 1 tablet (325 mg total) by mouth 2 (two) times daily. 30 tablet 0  . benazepril (LOTENSIN) 20 MG tablet Take 20 mg by mouth daily. Reported on 10/17/2015    . CALCIUM PO Take 2 tablets by mouth every morning.    . digoxin (LANOXIN) 0.25 MG tablet Take 1 tablet (0.25 mg total) by mouth daily.    Marland Kitchen diltiazem (CARDIZEM CD) 120 MG 24 hr capsule TK 1 C PO QD  3  . donepezil (ARICEPT) 10 MG tablet Take 20 mg by mouth at bedtime.    Marland Kitchen doxepin (SINEQUAN) 150 MG capsule Take 150 mg by mouth at bedtime.   11  . gabapentin (NEURONTIN) 400 MG capsule Take 400 mg by mouth 3 (three) times daily.     Marland Kitchen HYDROcodone-acetaminophen (NORCO) 10-325 MG per tablet Take 1 tablet by mouth 4 (four) times daily.    Marland Kitchen LORazepam (ATIVAN) 0.5 MG tablet Take 1 tablet (0.5 mg total) by mouth every 6 (six) hours as needed for anxiety. 30 tablet 0  . methocarbamol (ROBAXIN) 500 MG tablet Take 1 tablet  (500 mg total) by mouth every 6 (six) hours as needed for muscle spasms.    . metoCLOPramide (REGLAN) 5 MG tablet Take 1-2 tablets (5-10 mg total) by mouth every 8 (eight) hours as needed for nausea (if ondansetron (ZOFRAN) ineffective.).    Marland Kitchen metoprolol (LOPRESSOR) 50 MG tablet Take 1.5 tablets (75 mg total) by mouth every 6 (six) hours.    . pravastatin (PRAVACHOL) 40 MG tablet Take 40 mg by mouth every evening.    . sertraline (ZOLOFT) 100 MG tablet Take 200 mg by mouth every morning.     . zolpidem (AMBIEN) 5 MG tablet TK 1 T PO QD HS  5   No current facility-administered medications for this visit.    Discontinued Meds:   There are no discontinued medications.  Patient Active Problem List   Diagnosis Date Noted  . Intertrochanteric fracture of left femur ([place]) 10/19/2015  . Intertrochanteric fracture of left hip ([place])   . Hip fracture, left ([place]) 10/17/2015  . Chronic atrial fibrillation ([place])   . Fracture, intertrochanteric, left femur ([place])   . Clostridium difficile colitis 10/07/2014  . Atrial fibrillation  with RVR ([place]) 10/01/2014  . Fall at home 10/01/2014  . Hip fracture, right ([place]) 10/01/2014  . Atrial fibrillation with rapid ventricular response ([place]) 10/01/2014  . Dementia 10/01/2014  . COLONIC POLYPS, ADENOMATOUS, BENIGN 04/13/2009  . 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 BLADDER CANCER 04/21/2008  . Anxiety state 04/21/2008  . DEPRESSION 04/21/2008  . DEGENERATIVE JOINT DISEASE 04/21/2008  . 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 [place] DISEASE, LUMBOSACRAL SPINE 04/21/2008  . INSOMNIA 04/21/2008  . INCONTINENCE 04/21/2008  . HYPERLIPIDEMIA 02/04/2007  . Essential hypertension 02/04/2007  . GERD 02/04/2007  . DIVERTICULOSIS, COLON 02/04/2007  . COLONIC POLYPS, HX OF 02/04/2007    LABS    Component Value Date/Time   NA 137 10/21/2015 0515   NA 142 10/20/2015 0442   NA 140 10/18/2015 0620   K 4.3 10/21/2015 0515   K 5.1  10/20/2015 0442   K 4.1 10/18/2015 0620   CL 106 10/21/2015 0515   CL 109 10/20/2015 0442   CL 102 10/18/2015 0620   CO2 25 10/21/2015 0515   CO2 30 10/20/2015 0442   CO2 30 10/18/2015 0620   GLUCOSE 92 10/21/2015 0515   GLUCOSE 86 10/20/2015 0442   GLUCOSE 95 10/18/2015 0620   BUN 12 10/21/2015 0515   BUN 17 10/20/2015 0442   BUN 22* 10/18/2015 0620   CREATININE 0.75 10/21/2015 0515   CREATININE 0.78 10/20/2015 0442   CREATININE 0.88 10/18/2015 0620   CALCIUM 7.7* 10/21/2015 0515   CALCIUM 7.7* 10/20/2015 0442   CALCIUM 8.5* 10/18/2015 0620   GFRNONAA >60 10/21/2015 0515   GFRNONAA >60 10/20/2015 0442   GFRNONAA >60 10/18/2015 0620   GFRAA >60 10/21/2015 0515   GFRAA >60 10/20/2015 0442   GFRAA >60 10/18/2015 0620   CMP     Component Value Date/Time   NA 137 10/21/2015 0515   K 4.3 10/21/2015 0515   CL 106 10/21/2015 0515   CO2 25 10/21/2015 0515   GLUCOSE 92 10/21/2015 0515   BUN 12 10/21/2015 0515   CREATININE 0.75 10/21/2015 0515   CALCIUM 7.7* 10/21/2015 0515   PROT 6.6 09/30/2014 2332   ALBUMIN 3.6 09/30/2014 2332   AST 23 09/30/2014 2332   ALT 12 09/30/2014 2332   ALKPHOS 76 09/30/2014 2332   BILITOT 0.5 09/30/2014 2332   GFRNONAA >60 10/21/2015 0515   GFRAA >60 10/21/2015 0515       Component Value Date/Time   WBC 7.4 10/22/2015 0435   WBC 6.7 10/21/2015 0515   WBC 5.9 10/20/2015 0442   HGB 10.3* 10/22/2015 0435   HGB 10.2* 10/21/2015 0515   HGB 10.8* 10/20/2015 0442   HCT 32.5* 10/22/2015 0435   HCT 32.7* 10/21/2015 0515   HCT 34.5* 10/20/2015 0442   MCV 83.3 10/22/2015 0435   MCV 83.4 10/21/2015 0515   MCV 83.9 10/20/2015 0442    Lipid Panel     Component Value Date/Time   CHOL 158 09/08/2008 2319   TRIG 182* 09/08/2008 2319   HDL 49 09/08/2008 2319   CHOLHDL 3.2 Ratio 09/08/2008 2319   VLDL 36 09/08/2008 2319   LDLCALC 73 09/08/2008 2319   LDLDIRECT 137.6 07/15/2007 1119    ABG No results found for: PHART, PCO2ART, PO2ART, HCO3,  TCO2, ACIDBASEDEF, O2SAT   Lab Results  Component Value Date   TSH 2.928 10/01/2014   BNP (last 3 results) No results for input(s): BNP in the last 8760 hours.  ProBNP (last 3 results) No results for input(s): PROBNP  in the last 8760 hours.  Cardiac Panel (last 3 results) No results for input(s): CKTOTAL, CKMB, TROPONINI, RELINDX in the last 72 hours.  Iron/TIBC/Ferritin/ %Sat No results found for: IRON, TIBC, FERRITIN, IRONPCTSAT   EKG Orders placed or performed during the hospital encounter of 10/17/15  . EKG 12-Lead  . EKG 12-Lead  . EKG  . EKG 12-Lead  . EKG 12-Lead  . EKG 12-Lead  . EKG 12-Lead     Prior Assessment and Plan Problem List as of 11/08/2015      Cardiovascular and Mediastinum   Essential hypertension   Atrial fibrillation with RVR (HCC)   Atrial fibrillation with rapid ventricular response (HCC)   Chronic atrial fibrillation (HCC)     Digestive   GERD   DIVERTICULOSIS, COLON   COLONIC POLYPS, ADENOMATOUS, BENIGN   Clostridium difficile colitis     Nervous and Auditory   Dementia     Musculoskeletal and Integument   DEGENERATIVE JOINT DISEASE   DEGENERATIVE DISC DISEASE, CERVICAL SPINE   DEGENERATIVE DISC DISEASE, LUMBOSACRAL SPINE   POSTMENOPAUSAL OSTEOPOROSIS   Hip fracture, right (HCC)   Hip fracture, left (HCC)   Fracture, intertrochanteric, left femur (HCC)   Intertrochanteric fracture of left hip (HCC)   Intertrochanteric fracture of left femur (HCC)     Genitourinary   BLADDER CANCER     Other   HYPERLIPIDEMIA   Anxiety state   DEPRESSION   INSOMNIA   INCONTINENCE   COLONIC POLYPS, HX OF   Fall at home       Imaging: Dg Chest 1 View  10/17/2015  CLINICAL DATA:  Pain.  Fall.  Initial encounter. EXAM: CHEST 1 VIEW COMPARISON:  09/30/2014 FINDINGS: The cardiac silhouette remains mildly enlarged. The lungs are hypoinflated with chronic diffuse interstitial prominence, not significantly changed. Slightly increased density is  present in the lung bases compared to the prior study. No overt pulmonary edema, sizable pleural effusion, or pneumothorax is identified. No acute osseous abnormality is identified. IMPRESSION: New, mild bibasilar opacities, likely atelectasis. Electronically Signed   By: Logan Bores M.D.   On: 10/17/2015 16:10   Dg Knee 1-2 Views Left  10/17/2015  CLINICAL DATA:  [place] Friday. Lt hip pain. Pt states no knee pain EXAM: LEFT KNEE - 1-2 VIEW COMPARISON:  None. FINDINGS: Small marginal spurs about the medial compartment. Narrowing of the articular cartilage. Negative for fracture or dislocation. No definite effusion. Patchy femoral-popliteal arterial calcifications. IMPRESSION: 1. Mild medial compartment degenerative spurring. No fracture or dislocation. Electronically Signed   By: Lucrezia Europe M.D.   On: 10/17/2015 16:11   Dg Chest Port 1 View  10/20/2015  CLINICAL DATA:  Left sided PICC line placement. EXAM: PORTABLE CHEST 1 VIEW COMPARISON:  Chest x-ray, same date. FINDINGS: Of the left-sided PICC line tip is in the distal SVC just above the region of the cavoatrial junction. No complicating features are demonstrated. The heart and lungs are unchanged. IMPRESSION: Well-positioned PICC line with the tip in the distal SVC. Electronically Signed   By: Marijo Sanes M.D.   On: 10/20/2015 14:11   Dg Chest Port 1 View  10/20/2015  CLINICAL DATA:  75 year old female with shortness of breath and recent hip fracture. EXAM: PORTABLE CHEST 1 VIEW COMPARISON:  10/17/2015 FINDINGS: Cardiomediastinal silhouette is unchanged. This is a mildly low volume film with mild bibasilar atelectasis. There is no evidence of focal airspace disease, pulmonary edema, suspicious pulmonary nodule/mass, pleural effusion, or pneumothorax. No acute bony abnormalities are identified.  IMPRESSION: No significant change -mild bibasilar atelectasis. Electronically Signed   By: Margarette Canada M.D.   On: 10/20/2015 10:17   Dg Hip Operative Unilat  With Pelvis Left  10/19/2015  CLINICAL DATA:  Open reduction internal fixation left hip EXAM: OPERATIVE left HIP (WITH PELVIS IF PERFORMED) 10 VIEWS TECHNIQUE: Fluoroscopic spot image(s) were submitted for interpretation post-operatively. COMPARISON:  None. FINDINGS: The patient is status post open reduction internal fixation of left intertrochanteric femoral fracture. There is intra medullary rod in proximal left femur. Metallic locking pin and noted along the left femoral neck. There is anatomic alignment. A locking screw is noted in proximal shaft of left femur. Fluoroscopy time was 1 minutes 36 seconds. Please see the operative report. IMPRESSION: Status post open reduction internal fixation of left intertrochanteric femoral fracture. Intra medullary rod and metallic fixation pin noted noted in proximal left femur. There is anatomic alignment. Please see the operative report. Electronically Signed   By: Lahoma Crocker M.D.   On: 10/19/2015 12:35   Dg Hip Unilat With Pelvis 2-3 Views Left  10/17/2015  CLINICAL DATA:  She fell on Friday and landed on her lt side. She asked to come to the hospital all weekend and her husband refused to bring her. She did ambulate with a walker over the weekend. Hx of rt hip surgery about 1 yr ago. EXAM: DG HIP (WITH OR WITHOUT PELVIS) 2-3V LEFT COMPARISON:  09/30/2014 FINDINGS: Interval Previous sliding screw and plate fixation across the right femoral neck, hardware incompletely visualized. Acute comminuted left intertrochanteric fracture, impacted with a varus angulation deformity. Bony pelvis intact.  Patchy iliofemoral arterial calcifications. IMPRESSION: 1. Comminuted impacted left intertrochanteric femur fracture. Electronically Signed   By: Lucrezia Europe M.D.   On: 10/17/2015 16:13

## 2015-11-08 NOTE — Progress Notes (Signed)
Cardiology Office Note   Date:  11/08/2015   ID:  Tami [name], DOB 1941-06-29, MRN [id number]  PCP:  Alonza Bogus, MD  Cardiologist: Woodroe Chen, NP   Chief Complaint  Patient presents with  . Atrial Fibrillation      History of Present Illness: Tami [name] is a 74 y.o. female who presents for post hospital follow after admission for left hip fracture,  atrial fib, CHADS VASC Score of 6 on Eliquis, with other history to include anxiety, hypertension and CVA. She required amiodarone and a brief course of Neo-Synephrine. She was sent home on digoxin 0.25 mg daily, metoprolol 75  mg daily, and is in SNF.   She continues with mild dementia, but denies any chest pain, rapid heart rhythm, or dyspnea.  She is undergoing physical therapy.  She is accompanied by her husband.  He states, that she will be going home in the next couple of weeks and will have home health assistance.  She denies any symptoms, and her husband has denied any new symptoms, that he is observed as well.  Past Medical History  Diagnosis Date  . Arthritis   . Cancer (HCC) Cervical  . Hypertension   . Stroke ([place])   . Anxiety   . Dementia     Past Surgical History  Procedure Laterality Date  . Knee arthroscopy Right   . Femur closed reduction Right   . Appendectomy    . Cholecystectomy    . Brain surgery    . Tonsillectomy    . Orif hip fracture Right 10/08/2014    Procedure: OPEN REDUCTION INTERNAL FIXATION RIGHT HIP;  Surgeon: Sanjuana Kava, MD;  Location: [facility];  Service: Orthopedics;  Laterality: Right;  . Intramedullary (im) nail intertrochanteric Left 10/19/2015    Procedure: INTERNAL FIXATION LEFT HIP;  Surgeon: Carole Civil, MD;  Location: [facility];  Service: Orthopedics;  Laterality: Left;     Current Outpatient Prescriptions  Medication Sig Dispense Refill  . albuterol (PROVENTIL) (2.5 MG/3ML) 0.083% nebulizer solution Take 3 mLs (2.5 mg total) by nebulization every 4  (four) hours as needed for wheezing or shortness of breath. 75 mL 12  . aspirin EC 325 MG EC tablet Take 1 tablet (325 mg total) by mouth 2 (two) times daily. 30 tablet 0  . benazepril (LOTENSIN) 20 MG tablet Take 20 mg by mouth daily. Reported on 10/17/2015    . CALCIUM PO Take 2 tablets by mouth every morning.    . digoxin (LANOXIN) 0.25 MG tablet Take 1 tablet (0.25 mg total) by mouth daily.    Marland Kitchen diltiazem (CARDIZEM CD) 120 MG 24 hr capsule TK 1 C PO QD  3  . donepezil (ARICEPT) 10 MG tablet Take 20 mg by mouth at bedtime.    Marland Kitchen doxepin (SINEQUAN) 150 MG capsule Take 150 mg by mouth at bedtime.   11  . gabapentin (NEURONTIN) 400 MG capsule Take 400 mg by mouth 3 (three) times daily.     Marland Kitchen HYDROcodone-acetaminophen (NORCO) 10-325 MG per tablet Take 1 tablet by mouth 4 (four) times daily.    Marland Kitchen LORazepam (ATIVAN) 0.5 MG tablet Take 1 tablet (0.5 mg total) by mouth every 6 (six) hours as needed for anxiety. 30 tablet 0  . methocarbamol (ROBAXIN) 500 MG tablet Take 1 tablet (500 mg total) by mouth every 6 (six) hours as needed for muscle spasms.    . metoCLOPramide (REGLAN) 5 MG tablet Take 1-2 tablets (5-10 mg total)  by mouth every 8 (eight) hours as needed for nausea (if ondansetron (ZOFRAN) ineffective.).    Marland Kitchen metoprolol (LOPRESSOR) 50 MG tablet Take 1.5 tablets (75 mg total) by mouth every 6 (six) hours.    . pravastatin (PRAVACHOL) 40 MG tablet Take 40 mg by mouth every evening.    . sertraline (ZOLOFT) 100 MG tablet Take 200 mg by mouth every morning.     . zolpidem (AMBIEN) 5 MG tablet TK 1 T PO QD HS  5   No current facility-administered medications for this visit.    Allergies:   Penicillins    Social History:  The patient  reports that she quit smoking about 9 years ago. She has never used smokeless tobacco. She reports that she does not drink alcohol or use illicit drugs.   Family History:  The patient's family history includes Stroke in her mother.    ROS: All other systems are  reviewed and negative. Unless otherwise mentioned in H&P    PHYSICAL EXAM: VS:  BP 130/80 mmHg  Pulse 53  Ht 5\' 5"  (1.651 m)  Wt 172 lb (78.019 kg)  BMI 28.62 kg/m2  SpO2 97% , BMI Body mass index is 28.62 kg/(m^2). GEN: Well nourished, well developed, in no acute distresssitting in a wheelchair. HEENT: normal Neck: no JVD, carotid bruits, or masses Cardiac: IRRR; no murmurs, rubs, or gallops,no edema  Respiratory:  Clear to auscultation bilaterally, normal work of breathing GI: soft, nontender, nondistended, + BS MS: no deformity or atrophy Skin: warm and dry, no rash Neuro:  Strength and sensation are intact Psych: euthymic mood, full affect  Recent Labs: 10/21/2015: BUN 12; Creatinine, Ser 0.75; Potassium 4.3; Sodium 137 10/22/2015: Hemoglobin 10.3*; Platelets 186    Lipid Panel    Component Value Date/Time   CHOL 158 09/08/2008 2319   TRIG 182* 09/08/2008 2319   HDL 49 09/08/2008 2319   CHOLHDL 3.2 Ratio 09/08/2008 2319   VLDL 36 09/08/2008 2319   LDLCALC 73 09/08/2008 2319   LDLDIRECT 137.6 07/15/2007 1119      Wt Readings from Last 3 Encounters:  11/08/15 172 lb (78.019 kg)  10/24/15 194 lb 0.1 oz (88 kg)  11/15/14 180 lb (81.647 kg)     ASSESSMENT AND PLAN:  1. Atrial fibrillation: She is not on anticoagulation due to frequent falls.  She has fractured both hips secondary to fall.  Heart rate is well-controlled currently on metoprolol 75 mg every 6 hours, diltiazem, 120 mg daily, and digoxin 0.25 mg daily.  Will not make any changes currently.  2. Hypertension: blood pressure is currently very well controlled.  She will continue on benazepril and above.  Rate control medications, which are also affected.  Blood pressure.  We will see her again in 3 months unless she becomes symptomatic.    Current medicines are reviewed at length with the patient today.    Labs/ tests ordered today include:  No orders of the defined types were placed in this encounter.      Disposition:   FU with  3 months  Signed, Jory Sims, NP  11/08/2015 1:51 PM    [facility] [address] [place], [place] [address] Phone: [phone]; Fax: [phone]

## 2015-11-08 NOTE — Telephone Encounter (Signed)
Call received from [facility] at [facility] to inquire about scheduling appointment for patient, and also regarding orders. States just came to their attention, due to discharge summary issued by Dr Luan Pulling - that patient had recent left hip surgery (internal fixation) 10/19/15.  States patient still has staples in.  Please advise.    We have gone ahead and scheduled the hospital follow up/post op office appointment for 11/21/15, which is the date that facility can provide transportation.  Please also provide order for Xrays prior, and Inez Catalina states they will have these done at [facility] by 10/20/15.  Ph# for facility, [address], [phone].  Alternate ph# (regarding appointment) [phone].

## 2015-11-08 NOTE — Patient Instructions (Signed)
Your physician wants you to follow-up in: 3 Months. You will receive a reminder letter in the mail two months in advance. If you don't receive a letter, please call our office to schedule the follow-up appointment.  Your physician recommends that you continue on your current medications as directed. Please refer to the Current Medication list given to you today.  If you need a refill on your cardiac medications before your next appointment, please call your pharmacy.  Thank you for choosing [facility]!

## 2015-11-09 ENCOUNTER — Other Ambulatory Visit: Payer: Self-pay | Admitting: *Deleted

## 2015-11-09 ENCOUNTER — Ambulatory Visit: Payer: Commercial Managed Care - HMO

## 2015-11-09 DIAGNOSIS — S72142D Displaced intertrochanteric fracture of left femur, subsequent encounter for closed fracture with routine healing: Secondary | ICD-10-CM

## 2015-11-09 NOTE — Telephone Encounter (Signed)
Verbal order given to nurse Rosalee to remove staples  Xray order entered for x rays @ [facility] to be done prior to office visit next week

## 2015-11-14 DIAGNOSIS — R1312 Dysphagia, oropharyngeal phase: Secondary | ICD-10-CM | POA: Diagnosis not present

## 2015-11-14 DIAGNOSIS — S72002D Fracture of unspecified part of neck of left femur, subsequent encounter for closed fracture with routine healing: Secondary | ICD-10-CM | POA: Diagnosis not present

## 2015-11-14 DIAGNOSIS — L89151 Pressure ulcer of sacral region, stage 1: Secondary | ICD-10-CM | POA: Diagnosis not present

## 2015-11-14 DIAGNOSIS — F0390 Unspecified dementia without behavioral disturbance: Secondary | ICD-10-CM | POA: Diagnosis not present

## 2015-11-14 DIAGNOSIS — S72002A Fracture of unspecified part of neck of left femur, initial encounter for closed fracture: Secondary | ICD-10-CM | POA: Diagnosis not present

## 2015-11-14 DIAGNOSIS — I482 Chronic atrial fibrillation: Secondary | ICD-10-CM | POA: Diagnosis not present

## 2015-11-15 DIAGNOSIS — F0390 Unspecified dementia without behavioral disturbance: Secondary | ICD-10-CM | POA: Diagnosis not present

## 2015-11-15 DIAGNOSIS — L89151 Pressure ulcer of sacral region, stage 1: Secondary | ICD-10-CM | POA: Diagnosis not present

## 2015-11-15 DIAGNOSIS — I482 Chronic atrial fibrillation: Secondary | ICD-10-CM | POA: Diagnosis not present

## 2015-11-15 DIAGNOSIS — S72002D Fracture of unspecified part of neck of left femur, subsequent encounter for closed fracture with routine healing: Secondary | ICD-10-CM | POA: Diagnosis not present

## 2015-11-15 DIAGNOSIS — R1312 Dysphagia, oropharyngeal phase: Secondary | ICD-10-CM | POA: Diagnosis not present

## 2015-11-17 DIAGNOSIS — I482 Chronic atrial fibrillation: Secondary | ICD-10-CM | POA: Diagnosis not present

## 2015-11-17 DIAGNOSIS — R1312 Dysphagia, oropharyngeal phase: Secondary | ICD-10-CM | POA: Diagnosis not present

## 2015-11-17 DIAGNOSIS — S72002D Fracture of unspecified part of neck of left femur, subsequent encounter for closed fracture with routine healing: Secondary | ICD-10-CM | POA: Diagnosis not present

## 2015-11-17 DIAGNOSIS — F0390 Unspecified dementia without behavioral disturbance: Secondary | ICD-10-CM | POA: Diagnosis not present

## 2015-11-17 DIAGNOSIS — L89151 Pressure ulcer of sacral region, stage 1: Secondary | ICD-10-CM | POA: Diagnosis not present

## 2015-11-18 DIAGNOSIS — I482 Chronic atrial fibrillation: Secondary | ICD-10-CM | POA: Diagnosis not present

## 2015-11-18 DIAGNOSIS — F0390 Unspecified dementia without behavioral disturbance: Secondary | ICD-10-CM | POA: Diagnosis not present

## 2015-11-18 DIAGNOSIS — L89151 Pressure ulcer of sacral region, stage 1: Secondary | ICD-10-CM | POA: Diagnosis not present

## 2015-11-18 DIAGNOSIS — S72002D Fracture of unspecified part of neck of left femur, subsequent encounter for closed fracture with routine healing: Secondary | ICD-10-CM | POA: Diagnosis not present

## 2015-11-18 DIAGNOSIS — R1312 Dysphagia, oropharyngeal phase: Secondary | ICD-10-CM | POA: Diagnosis not present

## 2015-11-21 ENCOUNTER — Ambulatory Visit: Payer: Commercial Managed Care - HMO | Admitting: Orthopedic Surgery

## 2015-11-21 DIAGNOSIS — L89151 Pressure ulcer of sacral region, stage 1: Secondary | ICD-10-CM | POA: Diagnosis not present

## 2015-11-21 DIAGNOSIS — I482 Chronic atrial fibrillation: Secondary | ICD-10-CM | POA: Diagnosis not present

## 2015-11-21 DIAGNOSIS — F0390 Unspecified dementia without behavioral disturbance: Secondary | ICD-10-CM | POA: Diagnosis not present

## 2015-11-21 DIAGNOSIS — R1312 Dysphagia, oropharyngeal phase: Secondary | ICD-10-CM | POA: Diagnosis not present

## 2015-11-21 DIAGNOSIS — S72002D Fracture of unspecified part of neck of left femur, subsequent encounter for closed fracture with routine healing: Secondary | ICD-10-CM | POA: Diagnosis not present

## 2015-11-22 DIAGNOSIS — I482 Chronic atrial fibrillation: Secondary | ICD-10-CM | POA: Diagnosis not present

## 2015-11-22 DIAGNOSIS — R1312 Dysphagia, oropharyngeal phase: Secondary | ICD-10-CM | POA: Diagnosis not present

## 2015-11-22 DIAGNOSIS — S72002D Fracture of unspecified part of neck of left femur, subsequent encounter for closed fracture with routine healing: Secondary | ICD-10-CM | POA: Diagnosis not present

## 2015-11-22 DIAGNOSIS — F0390 Unspecified dementia without behavioral disturbance: Secondary | ICD-10-CM | POA: Diagnosis not present

## 2015-11-22 DIAGNOSIS — L89151 Pressure ulcer of sacral region, stage 1: Secondary | ICD-10-CM | POA: Diagnosis not present

## 2015-11-23 DIAGNOSIS — R1312 Dysphagia, oropharyngeal phase: Secondary | ICD-10-CM | POA: Diagnosis not present

## 2015-11-23 DIAGNOSIS — L89151 Pressure ulcer of sacral region, stage 1: Secondary | ICD-10-CM | POA: Diagnosis not present

## 2015-11-23 DIAGNOSIS — S72002D Fracture of unspecified part of neck of left femur, subsequent encounter for closed fracture with routine healing: Secondary | ICD-10-CM | POA: Diagnosis not present

## 2015-11-23 DIAGNOSIS — F0390 Unspecified dementia without behavioral disturbance: Secondary | ICD-10-CM | POA: Diagnosis not present

## 2015-11-23 DIAGNOSIS — I482 Chronic atrial fibrillation: Secondary | ICD-10-CM | POA: Diagnosis not present

## 2015-11-24 DIAGNOSIS — R1312 Dysphagia, oropharyngeal phase: Secondary | ICD-10-CM | POA: Diagnosis not present

## 2015-11-24 DIAGNOSIS — F0390 Unspecified dementia without behavioral disturbance: Secondary | ICD-10-CM | POA: Diagnosis not present

## 2015-11-24 DIAGNOSIS — I482 Chronic atrial fibrillation: Secondary | ICD-10-CM | POA: Diagnosis not present

## 2015-11-24 DIAGNOSIS — S72002D Fracture of unspecified part of neck of left femur, subsequent encounter for closed fracture with routine healing: Secondary | ICD-10-CM | POA: Diagnosis not present

## 2015-11-24 DIAGNOSIS — L89151 Pressure ulcer of sacral region, stage 1: Secondary | ICD-10-CM | POA: Diagnosis not present

## 2015-11-25 DIAGNOSIS — L89151 Pressure ulcer of sacral region, stage 1: Secondary | ICD-10-CM | POA: Diagnosis not present

## 2015-11-25 DIAGNOSIS — F0390 Unspecified dementia without behavioral disturbance: Secondary | ICD-10-CM | POA: Diagnosis not present

## 2015-11-25 DIAGNOSIS — S72002D Fracture of unspecified part of neck of left femur, subsequent encounter for closed fracture with routine healing: Secondary | ICD-10-CM | POA: Diagnosis not present

## 2015-11-25 DIAGNOSIS — R1312 Dysphagia, oropharyngeal phase: Secondary | ICD-10-CM | POA: Diagnosis not present

## 2015-11-25 DIAGNOSIS — I482 Chronic atrial fibrillation: Secondary | ICD-10-CM | POA: Diagnosis not present

## 2015-11-29 DIAGNOSIS — L89151 Pressure ulcer of sacral region, stage 1: Secondary | ICD-10-CM | POA: Diagnosis not present

## 2015-11-29 DIAGNOSIS — S72002D Fracture of unspecified part of neck of left femur, subsequent encounter for closed fracture with routine healing: Secondary | ICD-10-CM | POA: Diagnosis not present

## 2015-11-29 DIAGNOSIS — R1312 Dysphagia, oropharyngeal phase: Secondary | ICD-10-CM | POA: Diagnosis not present

## 2015-11-29 DIAGNOSIS — I482 Chronic atrial fibrillation: Secondary | ICD-10-CM | POA: Diagnosis not present

## 2015-11-29 DIAGNOSIS — F0390 Unspecified dementia without behavioral disturbance: Secondary | ICD-10-CM | POA: Diagnosis not present

## 2015-11-30 DIAGNOSIS — C539 Malignant neoplasm of cervix uteri, unspecified: Secondary | ICD-10-CM | POA: Diagnosis not present

## 2015-11-30 DIAGNOSIS — I10 Essential (primary) hypertension: Secondary | ICD-10-CM | POA: Diagnosis not present

## 2015-11-30 DIAGNOSIS — I4891 Unspecified atrial fibrillation: Secondary | ICD-10-CM | POA: Diagnosis not present

## 2015-11-30 DIAGNOSIS — J449 Chronic obstructive pulmonary disease, unspecified: Secondary | ICD-10-CM | POA: Diagnosis not present

## 2015-12-01 DIAGNOSIS — F0390 Unspecified dementia without behavioral disturbance: Secondary | ICD-10-CM | POA: Diagnosis not present

## 2015-12-01 DIAGNOSIS — L89151 Pressure ulcer of sacral region, stage 1: Secondary | ICD-10-CM | POA: Diagnosis not present

## 2015-12-01 DIAGNOSIS — I482 Chronic atrial fibrillation: Secondary | ICD-10-CM | POA: Diagnosis not present

## 2015-12-01 DIAGNOSIS — R1312 Dysphagia, oropharyngeal phase: Secondary | ICD-10-CM | POA: Diagnosis not present

## 2015-12-01 DIAGNOSIS — S72002D Fracture of unspecified part of neck of left femur, subsequent encounter for closed fracture with routine healing: Secondary | ICD-10-CM | POA: Diagnosis not present

## 2015-12-02 DIAGNOSIS — L89151 Pressure ulcer of sacral region, stage 1: Secondary | ICD-10-CM | POA: Diagnosis not present

## 2015-12-02 DIAGNOSIS — I482 Chronic atrial fibrillation: Secondary | ICD-10-CM | POA: Diagnosis not present

## 2015-12-02 DIAGNOSIS — S72002D Fracture of unspecified part of neck of left femur, subsequent encounter for closed fracture with routine healing: Secondary | ICD-10-CM | POA: Diagnosis not present

## 2015-12-02 DIAGNOSIS — R1312 Dysphagia, oropharyngeal phase: Secondary | ICD-10-CM | POA: Diagnosis not present

## 2015-12-02 DIAGNOSIS — F0390 Unspecified dementia without behavioral disturbance: Secondary | ICD-10-CM | POA: Diagnosis not present

## 2015-12-03 DIAGNOSIS — R1312 Dysphagia, oropharyngeal phase: Secondary | ICD-10-CM | POA: Diagnosis not present

## 2015-12-03 DIAGNOSIS — F0390 Unspecified dementia without behavioral disturbance: Secondary | ICD-10-CM | POA: Diagnosis not present

## 2015-12-03 DIAGNOSIS — S72002D Fracture of unspecified part of neck of left femur, subsequent encounter for closed fracture with routine healing: Secondary | ICD-10-CM | POA: Diagnosis not present

## 2015-12-03 DIAGNOSIS — I482 Chronic atrial fibrillation: Secondary | ICD-10-CM | POA: Diagnosis not present

## 2015-12-03 DIAGNOSIS — L89151 Pressure ulcer of sacral region, stage 1: Secondary | ICD-10-CM | POA: Diagnosis not present

## 2015-12-06 DIAGNOSIS — S72002D Fracture of unspecified part of neck of left femur, subsequent encounter for closed fracture with routine healing: Secondary | ICD-10-CM | POA: Diagnosis not present

## 2015-12-06 DIAGNOSIS — I482 Chronic atrial fibrillation: Secondary | ICD-10-CM | POA: Diagnosis not present

## 2015-12-06 DIAGNOSIS — L89151 Pressure ulcer of sacral region, stage 1: Secondary | ICD-10-CM | POA: Diagnosis not present

## 2015-12-06 DIAGNOSIS — R1312 Dysphagia, oropharyngeal phase: Secondary | ICD-10-CM | POA: Diagnosis not present

## 2015-12-06 DIAGNOSIS — F0390 Unspecified dementia without behavioral disturbance: Secondary | ICD-10-CM | POA: Diagnosis not present

## 2015-12-07 DIAGNOSIS — I482 Chronic atrial fibrillation: Secondary | ICD-10-CM | POA: Diagnosis not present

## 2015-12-07 DIAGNOSIS — L89151 Pressure ulcer of sacral region, stage 1: Secondary | ICD-10-CM | POA: Diagnosis not present

## 2015-12-07 DIAGNOSIS — F0390 Unspecified dementia without behavioral disturbance: Secondary | ICD-10-CM | POA: Diagnosis not present

## 2015-12-07 DIAGNOSIS — S72002D Fracture of unspecified part of neck of left femur, subsequent encounter for closed fracture with routine healing: Secondary | ICD-10-CM | POA: Diagnosis not present

## 2015-12-07 DIAGNOSIS — R1312 Dysphagia, oropharyngeal phase: Secondary | ICD-10-CM | POA: Diagnosis not present

## 2015-12-08 DIAGNOSIS — F0390 Unspecified dementia without behavioral disturbance: Secondary | ICD-10-CM | POA: Diagnosis not present

## 2015-12-08 DIAGNOSIS — R1312 Dysphagia, oropharyngeal phase: Secondary | ICD-10-CM | POA: Diagnosis not present

## 2015-12-08 DIAGNOSIS — I482 Chronic atrial fibrillation: Secondary | ICD-10-CM | POA: Diagnosis not present

## 2015-12-08 DIAGNOSIS — S72002D Fracture of unspecified part of neck of left femur, subsequent encounter for closed fracture with routine healing: Secondary | ICD-10-CM | POA: Diagnosis not present

## 2015-12-08 DIAGNOSIS — L89151 Pressure ulcer of sacral region, stage 1: Secondary | ICD-10-CM | POA: Diagnosis not present

## 2015-12-09 DIAGNOSIS — R1312 Dysphagia, oropharyngeal phase: Secondary | ICD-10-CM | POA: Diagnosis not present

## 2015-12-09 DIAGNOSIS — I482 Chronic atrial fibrillation: Secondary | ICD-10-CM | POA: Diagnosis not present

## 2015-12-09 DIAGNOSIS — S72002D Fracture of unspecified part of neck of left femur, subsequent encounter for closed fracture with routine healing: Secondary | ICD-10-CM | POA: Diagnosis not present

## 2015-12-09 DIAGNOSIS — F0390 Unspecified dementia without behavioral disturbance: Secondary | ICD-10-CM | POA: Diagnosis not present

## 2015-12-09 DIAGNOSIS — L89151 Pressure ulcer of sacral region, stage 1: Secondary | ICD-10-CM | POA: Diagnosis not present

## 2015-12-10 DIAGNOSIS — R1312 Dysphagia, oropharyngeal phase: Secondary | ICD-10-CM | POA: Diagnosis not present

## 2015-12-10 DIAGNOSIS — I482 Chronic atrial fibrillation: Secondary | ICD-10-CM | POA: Diagnosis not present

## 2015-12-10 DIAGNOSIS — F0390 Unspecified dementia without behavioral disturbance: Secondary | ICD-10-CM | POA: Diagnosis not present

## 2015-12-10 DIAGNOSIS — L89151 Pressure ulcer of sacral region, stage 1: Secondary | ICD-10-CM | POA: Diagnosis not present

## 2015-12-10 DIAGNOSIS — S72002D Fracture of unspecified part of neck of left femur, subsequent encounter for closed fracture with routine healing: Secondary | ICD-10-CM | POA: Diagnosis not present

## 2015-12-12 DIAGNOSIS — S72002G Fracture of unspecified part of neck of left femur, subsequent encounter for closed fracture with delayed healing: Secondary | ICD-10-CM | POA: Diagnosis not present

## 2015-12-12 DIAGNOSIS — R0602 Shortness of breath: Secondary | ICD-10-CM | POA: Diagnosis not present

## 2015-12-12 DIAGNOSIS — L89151 Pressure ulcer of sacral region, stage 1: Secondary | ICD-10-CM | POA: Diagnosis not present

## 2015-12-12 DIAGNOSIS — R1312 Dysphagia, oropharyngeal phase: Secondary | ICD-10-CM | POA: Diagnosis not present

## 2015-12-12 DIAGNOSIS — F0390 Unspecified dementia without behavioral disturbance: Secondary | ICD-10-CM | POA: Diagnosis not present

## 2015-12-12 DIAGNOSIS — F419 Anxiety disorder, unspecified: Secondary | ICD-10-CM | POA: Diagnosis not present

## 2015-12-12 DIAGNOSIS — I482 Chronic atrial fibrillation: Secondary | ICD-10-CM | POA: Diagnosis not present

## 2015-12-12 DIAGNOSIS — S72002D Fracture of unspecified part of neck of left femur, subsequent encounter for closed fracture with routine healing: Secondary | ICD-10-CM | POA: Diagnosis not present

## 2015-12-14 DIAGNOSIS — I482 Chronic atrial fibrillation: Secondary | ICD-10-CM | POA: Diagnosis not present

## 2015-12-14 DIAGNOSIS — L89151 Pressure ulcer of sacral region, stage 1: Secondary | ICD-10-CM | POA: Diagnosis not present

## 2015-12-14 DIAGNOSIS — F0390 Unspecified dementia without behavioral disturbance: Secondary | ICD-10-CM | POA: Diagnosis not present

## 2015-12-14 DIAGNOSIS — S72002D Fracture of unspecified part of neck of left femur, subsequent encounter for closed fracture with routine healing: Secondary | ICD-10-CM | POA: Diagnosis not present

## 2015-12-14 DIAGNOSIS — R1312 Dysphagia, oropharyngeal phase: Secondary | ICD-10-CM | POA: Diagnosis not present

## 2015-12-17 DIAGNOSIS — S72002D Fracture of unspecified part of neck of left femur, subsequent encounter for closed fracture with routine healing: Secondary | ICD-10-CM | POA: Diagnosis not present

## 2015-12-17 DIAGNOSIS — R1312 Dysphagia, oropharyngeal phase: Secondary | ICD-10-CM | POA: Diagnosis not present

## 2015-12-17 DIAGNOSIS — I482 Chronic atrial fibrillation: Secondary | ICD-10-CM | POA: Diagnosis not present

## 2015-12-17 DIAGNOSIS — F0390 Unspecified dementia without behavioral disturbance: Secondary | ICD-10-CM | POA: Diagnosis not present

## 2015-12-17 DIAGNOSIS — L89151 Pressure ulcer of sacral region, stage 1: Secondary | ICD-10-CM | POA: Diagnosis not present

## 2015-12-23 DIAGNOSIS — R1312 Dysphagia, oropharyngeal phase: Secondary | ICD-10-CM | POA: Diagnosis not present

## 2015-12-23 DIAGNOSIS — S72002D Fracture of unspecified part of neck of left femur, subsequent encounter for closed fracture with routine healing: Secondary | ICD-10-CM | POA: Diagnosis not present

## 2015-12-23 DIAGNOSIS — I482 Chronic atrial fibrillation: Secondary | ICD-10-CM | POA: Diagnosis not present

## 2015-12-23 DIAGNOSIS — F0390 Unspecified dementia without behavioral disturbance: Secondary | ICD-10-CM | POA: Diagnosis not present

## 2015-12-23 DIAGNOSIS — L89151 Pressure ulcer of sacral region, stage 1: Secondary | ICD-10-CM | POA: Diagnosis not present

## 2015-12-26 DIAGNOSIS — I482 Chronic atrial fibrillation: Secondary | ICD-10-CM | POA: Diagnosis not present

## 2015-12-26 DIAGNOSIS — R1312 Dysphagia, oropharyngeal phase: Secondary | ICD-10-CM | POA: Diagnosis not present

## 2015-12-26 DIAGNOSIS — S72002D Fracture of unspecified part of neck of left femur, subsequent encounter for closed fracture with routine healing: Secondary | ICD-10-CM | POA: Diagnosis not present

## 2015-12-26 DIAGNOSIS — F0390 Unspecified dementia without behavioral disturbance: Secondary | ICD-10-CM | POA: Diagnosis not present

## 2015-12-26 DIAGNOSIS — L89151 Pressure ulcer of sacral region, stage 1: Secondary | ICD-10-CM | POA: Diagnosis not present

## 2015-12-30 DIAGNOSIS — I4891 Unspecified atrial fibrillation: Secondary | ICD-10-CM | POA: Diagnosis not present

## 2015-12-30 DIAGNOSIS — C539 Malignant neoplasm of cervix uteri, unspecified: Secondary | ICD-10-CM | POA: Diagnosis not present

## 2015-12-30 DIAGNOSIS — I10 Essential (primary) hypertension: Secondary | ICD-10-CM | POA: Diagnosis not present

## 2015-12-30 DIAGNOSIS — J449 Chronic obstructive pulmonary disease, unspecified: Secondary | ICD-10-CM | POA: Diagnosis not present

## 2016-01-05 DIAGNOSIS — R1312 Dysphagia, oropharyngeal phase: Secondary | ICD-10-CM | POA: Diagnosis not present

## 2016-01-05 DIAGNOSIS — I482 Chronic atrial fibrillation: Secondary | ICD-10-CM | POA: Diagnosis not present

## 2016-01-05 DIAGNOSIS — F0390 Unspecified dementia without behavioral disturbance: Secondary | ICD-10-CM | POA: Diagnosis not present

## 2016-01-05 DIAGNOSIS — L89151 Pressure ulcer of sacral region, stage 1: Secondary | ICD-10-CM | POA: Diagnosis not present

## 2016-01-05 DIAGNOSIS — S72002D Fracture of unspecified part of neck of left femur, subsequent encounter for closed fracture with routine healing: Secondary | ICD-10-CM | POA: Diagnosis not present

## 2016-01-11 DIAGNOSIS — F419 Anxiety disorder, unspecified: Secondary | ICD-10-CM | POA: Diagnosis not present

## 2016-01-11 DIAGNOSIS — R0602 Shortness of breath: Secondary | ICD-10-CM | POA: Diagnosis not present

## 2016-01-11 DIAGNOSIS — S72002G Fracture of unspecified part of neck of left femur, subsequent encounter for closed fracture with delayed healing: Secondary | ICD-10-CM | POA: Diagnosis not present

## 2016-01-11 DIAGNOSIS — I482 Chronic atrial fibrillation: Secondary | ICD-10-CM | POA: Diagnosis not present

## 2016-01-30 DIAGNOSIS — J449 Chronic obstructive pulmonary disease, unspecified: Secondary | ICD-10-CM | POA: Diagnosis not present

## 2016-01-30 DIAGNOSIS — C539 Malignant neoplasm of cervix uteri, unspecified: Secondary | ICD-10-CM | POA: Diagnosis not present

## 2016-01-30 DIAGNOSIS — I10 Essential (primary) hypertension: Secondary | ICD-10-CM | POA: Diagnosis not present

## 2016-01-30 DIAGNOSIS — I4891 Unspecified atrial fibrillation: Secondary | ICD-10-CM | POA: Diagnosis not present

## 2016-02-11 DIAGNOSIS — I482 Chronic atrial fibrillation: Secondary | ICD-10-CM | POA: Diagnosis not present

## 2016-02-11 DIAGNOSIS — F419 Anxiety disorder, unspecified: Secondary | ICD-10-CM | POA: Diagnosis not present

## 2016-02-11 DIAGNOSIS — S72002G Fracture of unspecified part of neck of left femur, subsequent encounter for closed fracture with delayed healing: Secondary | ICD-10-CM | POA: Diagnosis not present

## 2016-02-11 DIAGNOSIS — R0602 Shortness of breath: Secondary | ICD-10-CM | POA: Diagnosis not present

## 2016-02-23 ENCOUNTER — Ambulatory Visit: Payer: Commercial Managed Care - HMO | Admitting: Cardiovascular Disease

## 2016-02-29 DIAGNOSIS — I10 Essential (primary) hypertension: Secondary | ICD-10-CM | POA: Diagnosis not present

## 2016-02-29 DIAGNOSIS — J449 Chronic obstructive pulmonary disease, unspecified: Secondary | ICD-10-CM | POA: Diagnosis not present

## 2016-02-29 DIAGNOSIS — I4891 Unspecified atrial fibrillation: Secondary | ICD-10-CM | POA: Diagnosis not present

## 2016-02-29 DIAGNOSIS — C539 Malignant neoplasm of cervix uteri, unspecified: Secondary | ICD-10-CM | POA: Diagnosis not present

## 2016-03-12 DIAGNOSIS — I482 Chronic atrial fibrillation: Secondary | ICD-10-CM | POA: Diagnosis not present

## 2016-03-12 DIAGNOSIS — S72002G Fracture of unspecified part of neck of left femur, subsequent encounter for closed fracture with delayed healing: Secondary | ICD-10-CM | POA: Diagnosis not present

## 2016-03-12 DIAGNOSIS — R0602 Shortness of breath: Secondary | ICD-10-CM | POA: Diagnosis not present

## 2016-03-12 DIAGNOSIS — F419 Anxiety disorder, unspecified: Secondary | ICD-10-CM | POA: Diagnosis not present

## 2016-03-31 DIAGNOSIS — I10 Essential (primary) hypertension: Secondary | ICD-10-CM | POA: Diagnosis not present

## 2016-03-31 DIAGNOSIS — I4891 Unspecified atrial fibrillation: Secondary | ICD-10-CM | POA: Diagnosis not present

## 2016-03-31 DIAGNOSIS — C539 Malignant neoplasm of cervix uteri, unspecified: Secondary | ICD-10-CM | POA: Diagnosis not present

## 2016-03-31 DIAGNOSIS — J449 Chronic obstructive pulmonary disease, unspecified: Secondary | ICD-10-CM | POA: Diagnosis not present

## 2016-04-12 DIAGNOSIS — S72002G Fracture of unspecified part of neck of left femur, subsequent encounter for closed fracture with delayed healing: Secondary | ICD-10-CM | POA: Diagnosis not present

## 2016-04-12 DIAGNOSIS — I482 Chronic atrial fibrillation: Secondary | ICD-10-CM | POA: Diagnosis not present

## 2016-04-12 DIAGNOSIS — R0602 Shortness of breath: Secondary | ICD-10-CM | POA: Diagnosis not present

## 2016-04-12 DIAGNOSIS — F419 Anxiety disorder, unspecified: Secondary | ICD-10-CM | POA: Diagnosis not present

## 2016-05-13 DIAGNOSIS — R0602 Shortness of breath: Secondary | ICD-10-CM | POA: Diagnosis not present

## 2016-05-13 DIAGNOSIS — F419 Anxiety disorder, unspecified: Secondary | ICD-10-CM | POA: Diagnosis not present

## 2016-05-13 DIAGNOSIS — I482 Chronic atrial fibrillation: Secondary | ICD-10-CM | POA: Diagnosis not present

## 2016-05-13 DIAGNOSIS — S72002G Fracture of unspecified part of neck of left femur, subsequent encounter for closed fracture with delayed healing: Secondary | ICD-10-CM | POA: Diagnosis not present

## 2016-06-04 IMAGING — DX DG HIP (WITH OR WITHOUT PELVIS) 2-3V*R*
4 series · 4 of 4 positions shown · non-contrast
Comparison: None.

CLINICAL DATA: Acute onset of severe right hip pain, status post
fall. Initial encounter.

EXAM:
RIGHT HIP (WITH PELVIS) 2-3 VIEWS

[hip ap]
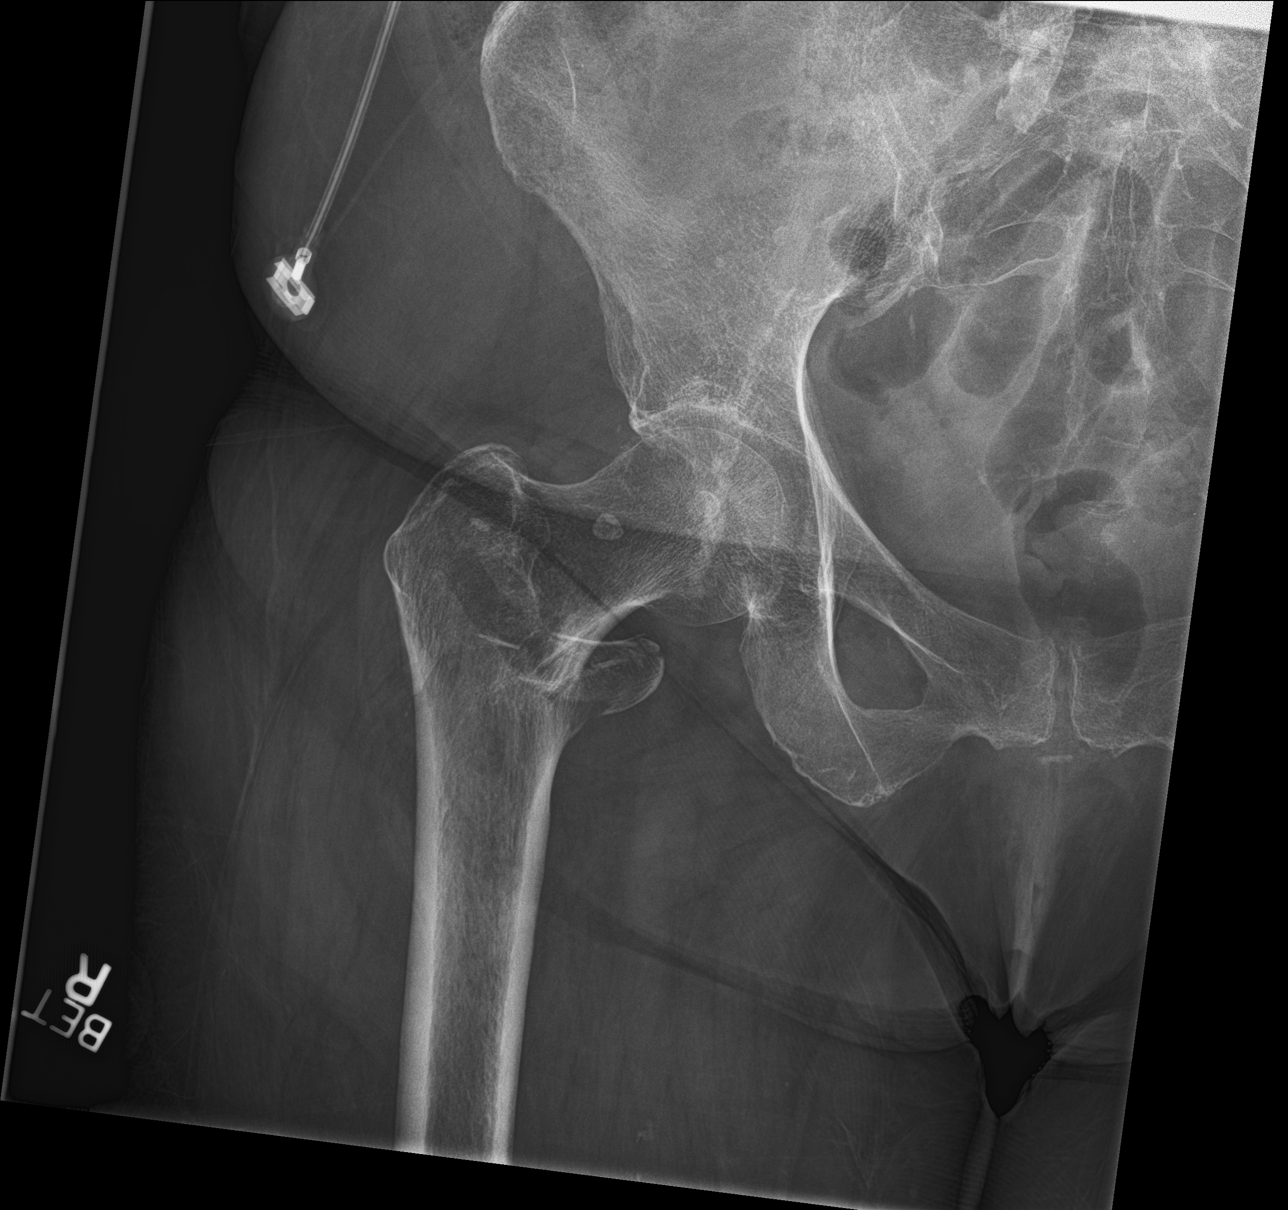

[hip lat]
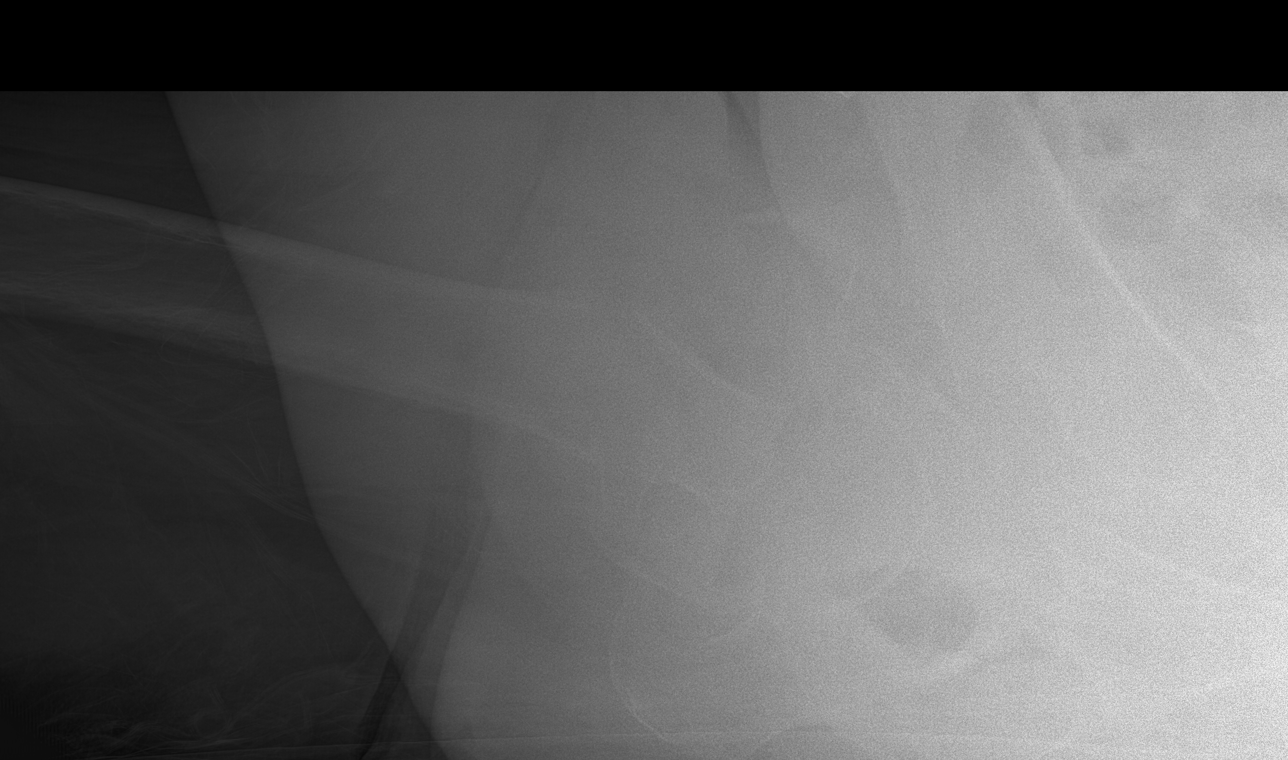

[hip frog leg]
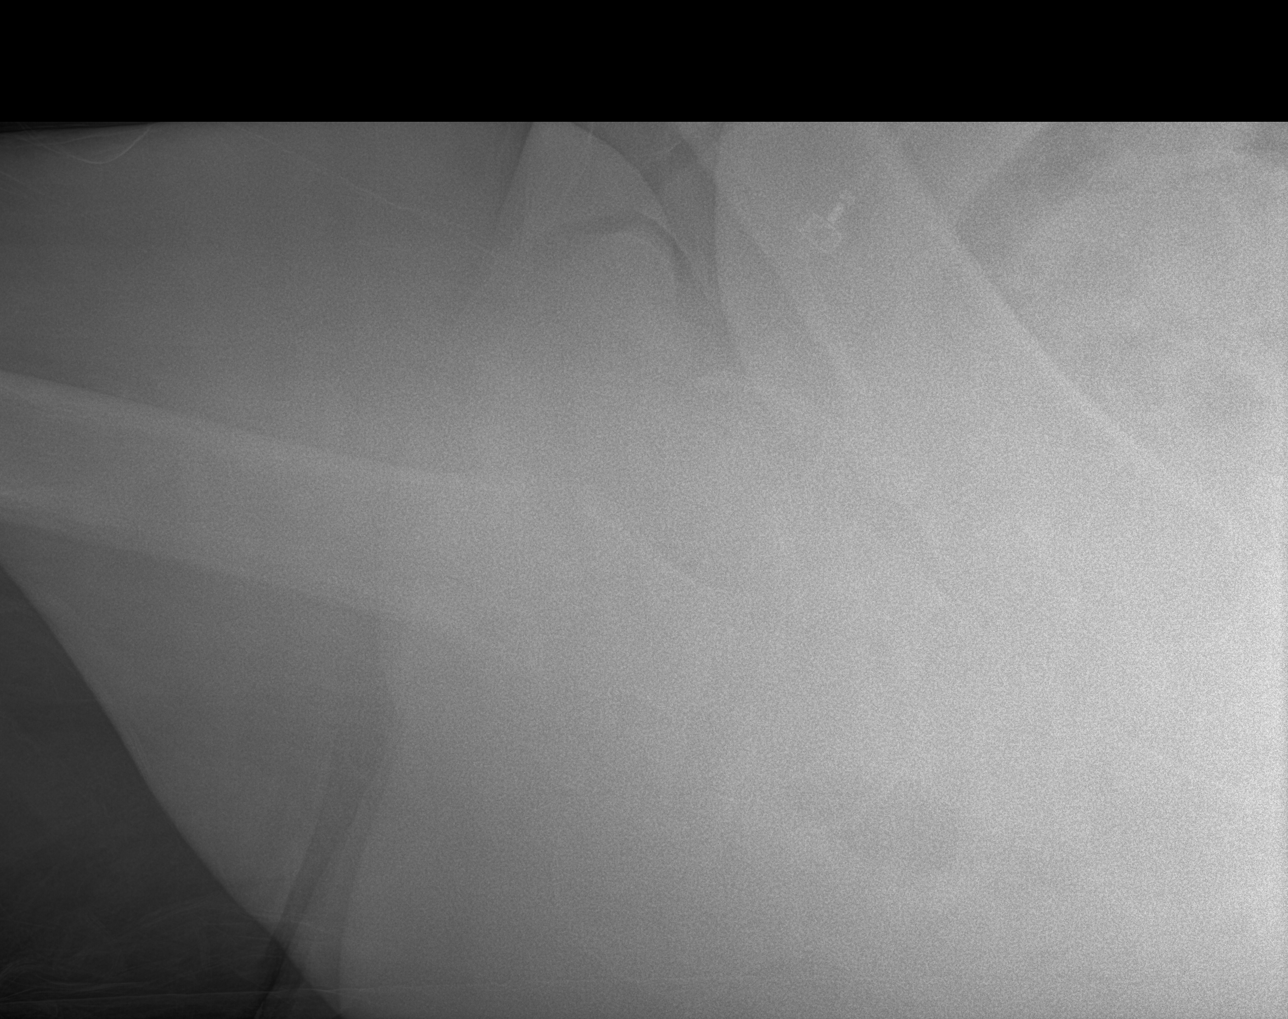

[pelvis ap]
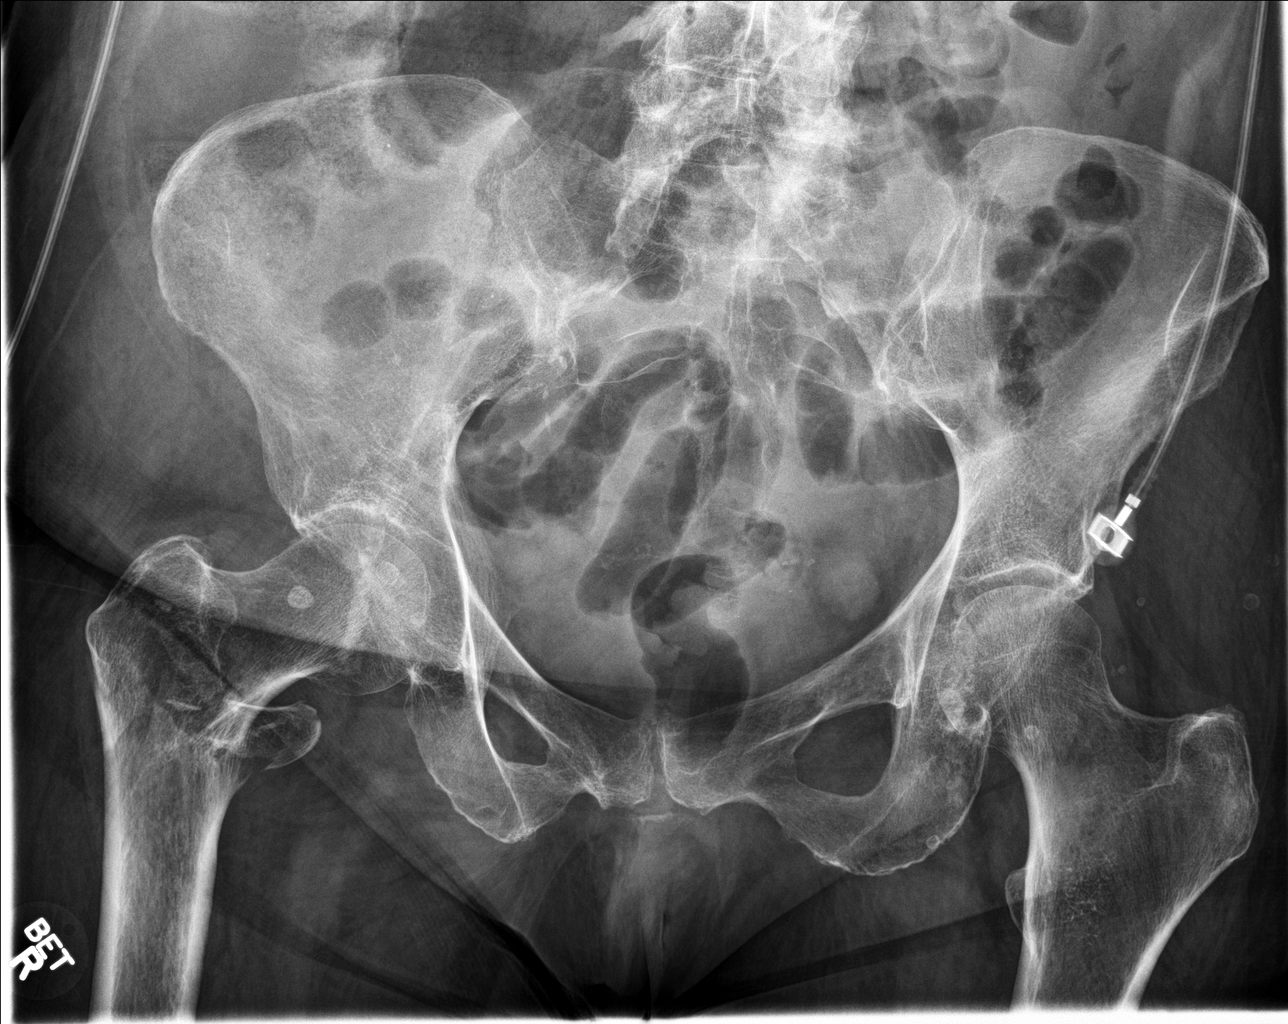

[4 of 4 positions shown; findings below may reference images not displayed]

FINDINGS: There is a comminuted intertrochanteric fracture through the
proximal right femur, with displaced comminuted lesser trochanteric
fragments. There is mild lateral and distal displacement of the
distal femur. The right femoral head remains seated at the
acetabulum. The left hip joint is grossly unremarkable. The
cross-table lateral views are markedly suboptimal due to the
patient's habitus.

Mild degenerative change is noted at the lower lumbar spine. The
sacroiliac joints are grossly unremarkable in appearance. The
visualized bowel gas pattern is grossly unremarkable.
IMPRESSION: Comminuted intertrochanteric fracture through the proximal right
femur, with displaced comminuted lesser trochanteric fragments. Mild
lateral and distal displacement of the distal femur.

## 2016-06-12 DIAGNOSIS — F419 Anxiety disorder, unspecified: Secondary | ICD-10-CM | POA: Diagnosis not present

## 2016-06-12 DIAGNOSIS — I482 Chronic atrial fibrillation: Secondary | ICD-10-CM | POA: Diagnosis not present

## 2016-06-12 DIAGNOSIS — R0602 Shortness of breath: Secondary | ICD-10-CM | POA: Diagnosis not present

## 2016-06-12 DIAGNOSIS — S72002G Fracture of unspecified part of neck of left femur, subsequent encounter for closed fracture with delayed healing: Secondary | ICD-10-CM | POA: Diagnosis not present

## 2016-07-13 DIAGNOSIS — S72002G Fracture of unspecified part of neck of left femur, subsequent encounter for closed fracture with delayed healing: Secondary | ICD-10-CM | POA: Diagnosis not present

## 2016-07-13 DIAGNOSIS — F419 Anxiety disorder, unspecified: Secondary | ICD-10-CM | POA: Diagnosis not present

## 2016-07-13 DIAGNOSIS — R0602 Shortness of breath: Secondary | ICD-10-CM | POA: Diagnosis not present

## 2016-07-13 DIAGNOSIS — I482 Chronic atrial fibrillation: Secondary | ICD-10-CM | POA: Diagnosis not present

## 2016-07-20 IMAGING — CT CT CERVICAL SPINE W/O CM
4 of 6 series · 15 of 33 positions shown, 16 images · non-contrast
Comparison: MRI cervical spine dated 08/15/2007

CLINICAL DATA: Right posterior neck pain, no known injury

EXAM:
CT CERVICAL SPINE WITHOUT CONTRAST
TECHNIQUE: Multidetector CT imaging of the cervical spine was performed without
intravenous contrast. Multiplanar CT image reconstructions were also
generated.

[Series 3: cervical 2.0 st axial · axial · 0.36mm/px · z∈[+22,+100]mm · 3 of 79 slices shown]
[im 20/79  bone]
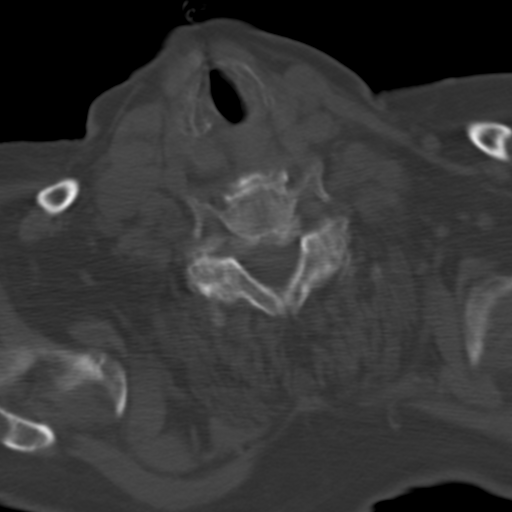
[im 40/79  bone]
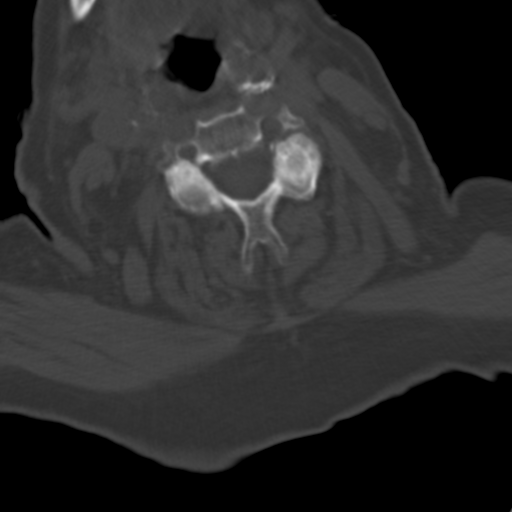
[im 59/79  bone]
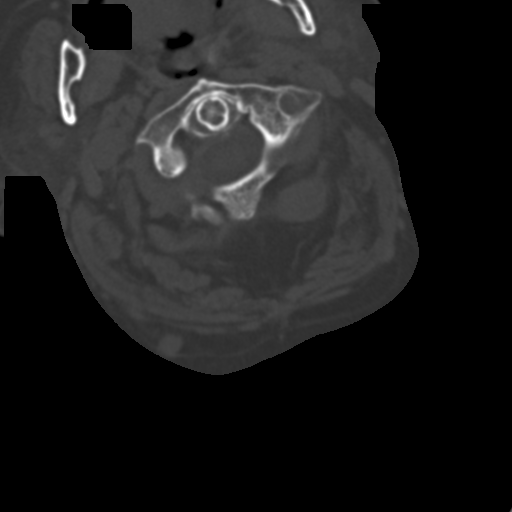

[Series 5: cervical spine sagittal bone · sagittal · 0.28mm/px · 5 of 64 slices shown]
[im 11/64  bone]
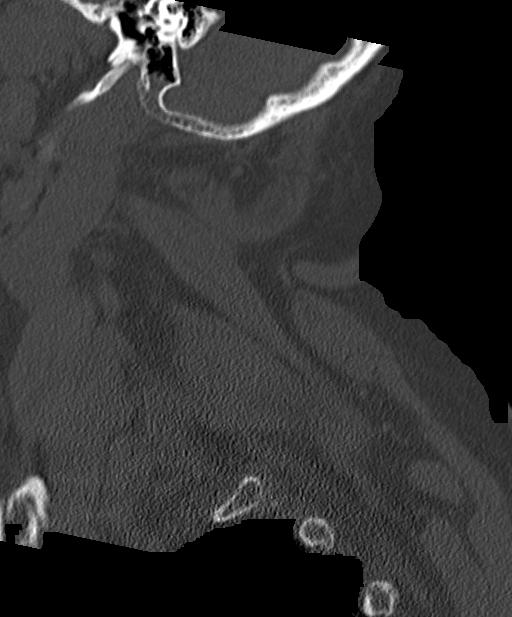
[im 22/64  bone]
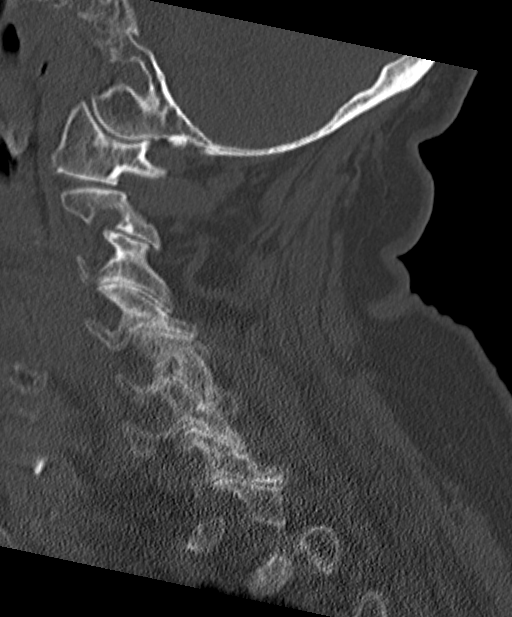
[im 32/64  bone]
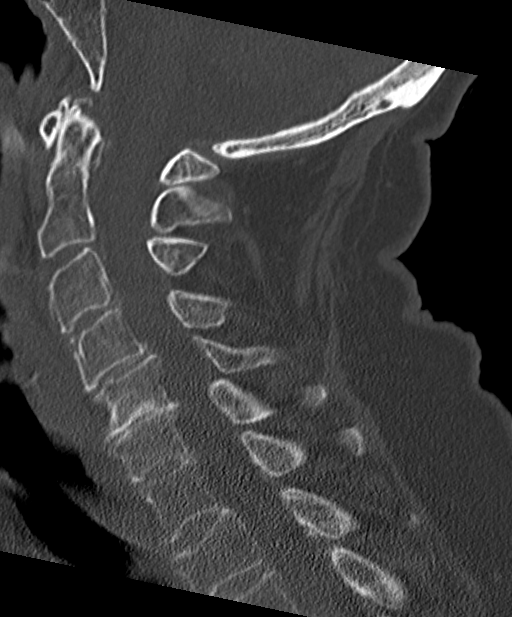
[im 43/64  bone]
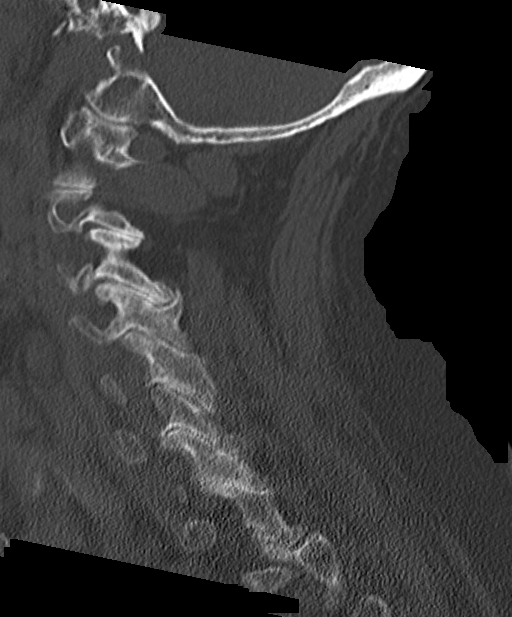
[im 53/64  bone]
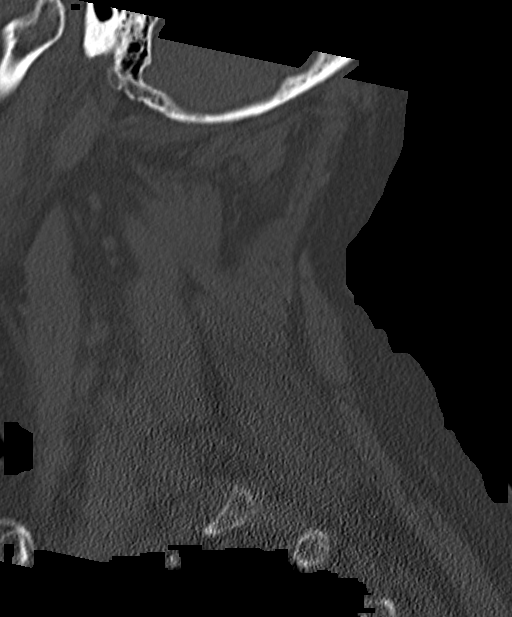

[Series 6: cervical spine coronal bone · coronal · 0.25mm/px · 3 of 77 slices shown]
[im 16/77  bone]
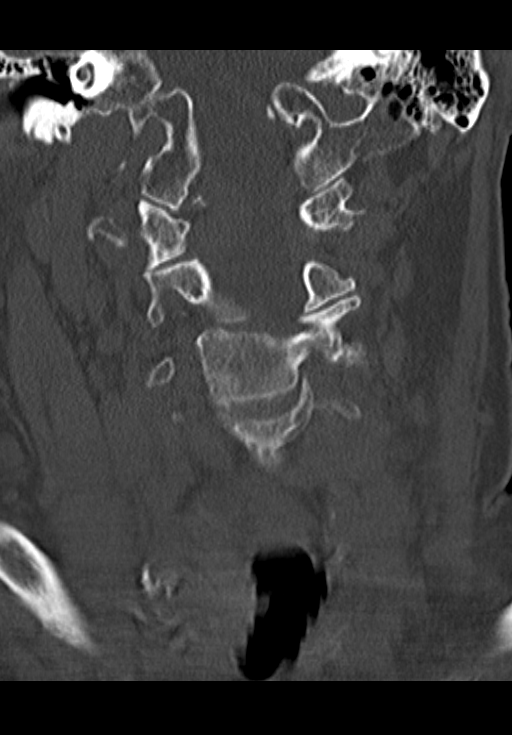
[im 31/77  bone]
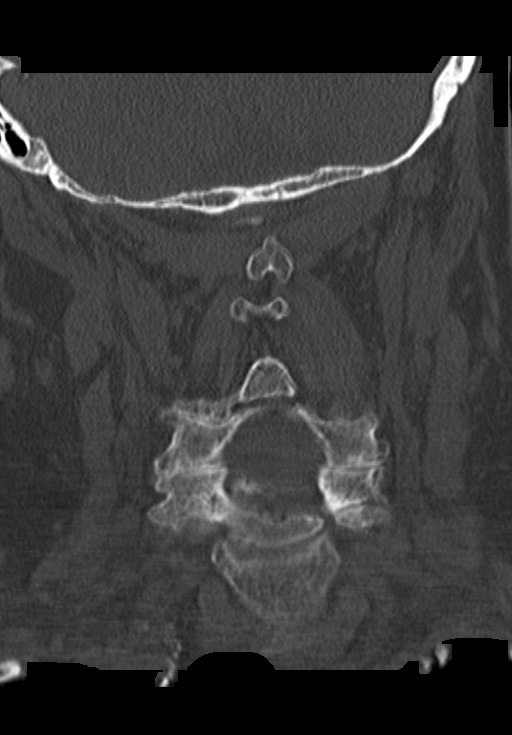
[im 46/77  bone]
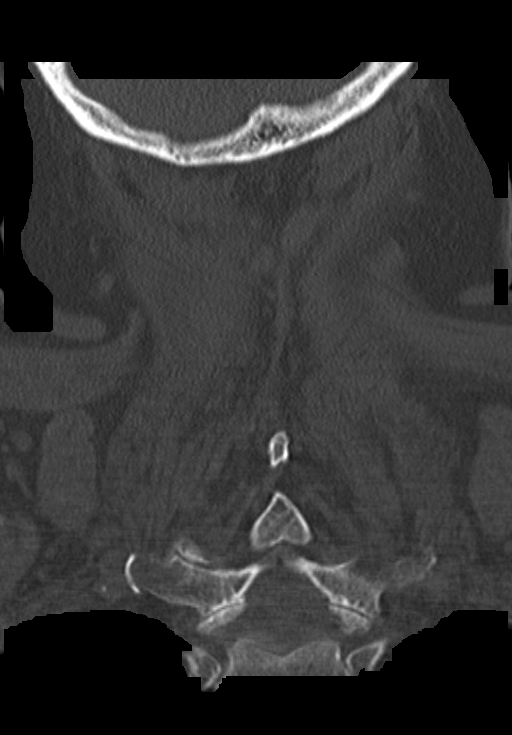

[Series 8: cervical spine axial bone · axial · 0.26mm/px · z∈[+6,+97]mm · 4 of 78 slices shown, 5 images]
[im 16/78  soft-tissue]
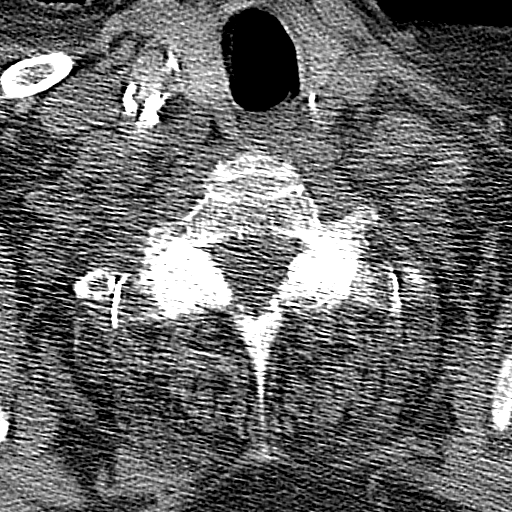
[im 16/78  bone]
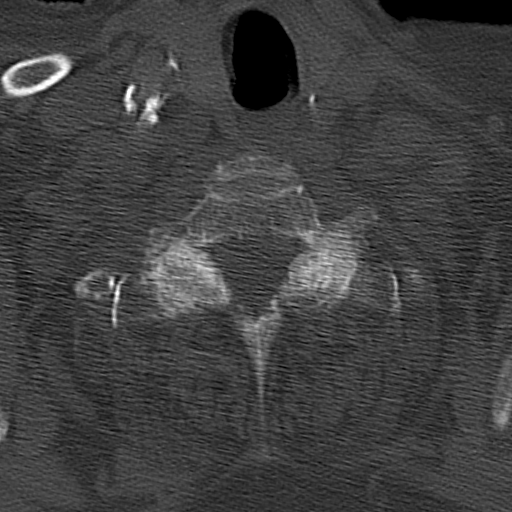
[im 31/78  bone]
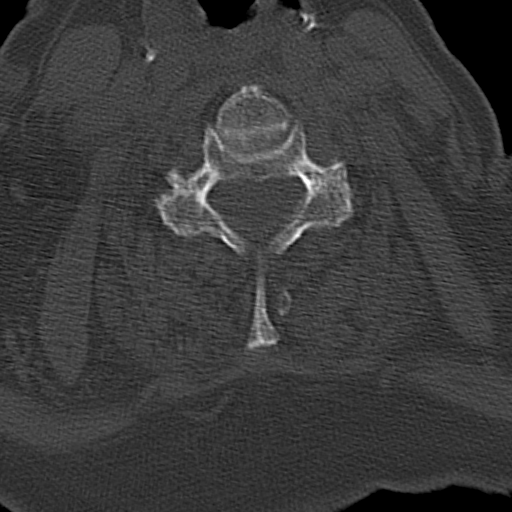
[im 47/78  bone]
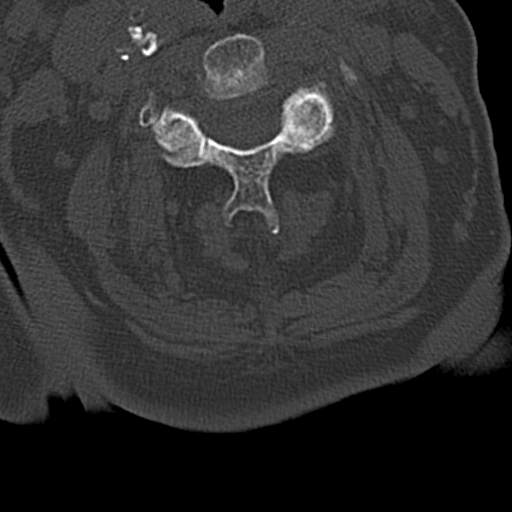
[im 62/78  bone]
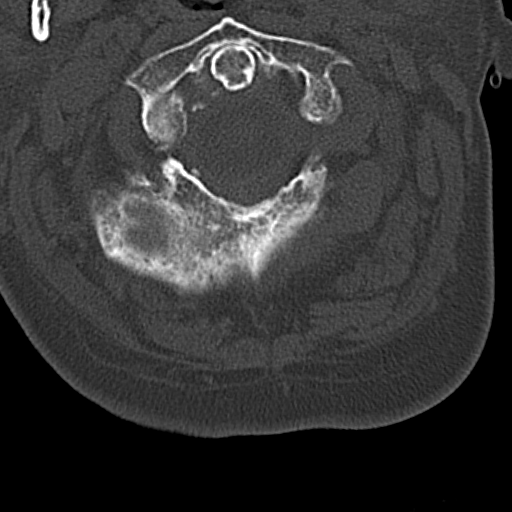

[15 of 33 positions shown; findings below may reference images not displayed]

FINDINGS: Motion degraded images.  Normal cervical lordosis.

No evidence of fracture or dislocation. Vertebral body heights are
maintained. Dens appears intact.

No prevertebral soft tissue swelling.

Mild to moderate degenerative changes, most prominent at C4-5 and
C5-6.

Visualized thyroid is unremarkable.

Visualized lung apices are notable for biapical pleural parenchymal
scarring.
IMPRESSION: No fracture or dislocation is seen.

Mild to moderate degenerative changes.

## 2016-08-12 DIAGNOSIS — S72002G Fracture of unspecified part of neck of left femur, subsequent encounter for closed fracture with delayed healing: Secondary | ICD-10-CM | POA: Diagnosis not present

## 2016-09-12 DIAGNOSIS — S72002G Fracture of unspecified part of neck of left femur, subsequent encounter for closed fracture with delayed healing: Secondary | ICD-10-CM | POA: Diagnosis not present

## 2016-10-13 DIAGNOSIS — S72002G Fracture of unspecified part of neck of left femur, subsequent encounter for closed fracture with delayed healing: Secondary | ICD-10-CM | POA: Diagnosis not present

## 2016-11-10 DIAGNOSIS — S72002G Fracture of unspecified part of neck of left femur, subsequent encounter for closed fracture with delayed healing: Secondary | ICD-10-CM | POA: Diagnosis not present

## 2017-04-27 ENCOUNTER — Emergency Department (HOSPITAL_COMMUNITY): Payer: Medicare HMO

## 2017-04-27 ENCOUNTER — Inpatient Hospital Stay (HOSPITAL_COMMUNITY)
Admission: EM | Admit: 2017-04-27 | Discharge: 2017-05-04 | DRG: 871 | Disposition: A | Discharge status: Home Health | Payer: Medicare HMO | Attending: Family Medicine | Admitting: Family Medicine

## 2017-04-27 ENCOUNTER — Inpatient Hospital Stay (HOSPITAL_COMMUNITY): Payer: Medicare HMO

## 2017-04-27 ENCOUNTER — Encounter (HOSPITAL_COMMUNITY): Payer: Self-pay

## 2017-04-27 DIAGNOSIS — R404 Transient alteration of awareness: Secondary | ICD-10-CM

## 2017-04-27 DIAGNOSIS — Z87891 Personal history of nicotine dependence: Secondary | ICD-10-CM | POA: Diagnosis not present

## 2017-04-27 DIAGNOSIS — R4182 Altered mental status, unspecified: Secondary | ICD-10-CM | POA: Diagnosis not present

## 2017-04-27 DIAGNOSIS — I248 Other forms of acute ischemic heart disease: Secondary | ICD-10-CM | POA: Diagnosis present

## 2017-04-27 DIAGNOSIS — E876 Hypokalemia: Secondary | ICD-10-CM | POA: Diagnosis not present

## 2017-04-27 DIAGNOSIS — J9601 Acute respiratory failure with hypoxia: Secondary | ICD-10-CM | POA: Diagnosis present

## 2017-04-27 DIAGNOSIS — E872 Acidosis: Secondary | ICD-10-CM | POA: Diagnosis not present

## 2017-04-27 DIAGNOSIS — N3000 Acute cystitis without hematuria: Secondary | ICD-10-CM | POA: Diagnosis present

## 2017-04-27 DIAGNOSIS — I4891 Unspecified atrial fibrillation: Secondary | ICD-10-CM | POA: Diagnosis not present

## 2017-04-27 DIAGNOSIS — N390 Urinary tract infection, site not specified: Secondary | ICD-10-CM | POA: Diagnosis not present

## 2017-04-27 DIAGNOSIS — F4489 Other dissociative and conversion disorders: Secondary | ICD-10-CM | POA: Diagnosis not present

## 2017-04-27 DIAGNOSIS — Z452 Encounter for adjustment and management of vascular access device: Secondary | ICD-10-CM

## 2017-04-27 DIAGNOSIS — R197 Diarrhea, unspecified: Secondary | ICD-10-CM | POA: Diagnosis present

## 2017-04-27 DIAGNOSIS — G9341 Metabolic encephalopathy: Secondary | ICD-10-CM | POA: Diagnosis present

## 2017-04-27 DIAGNOSIS — I482 Chronic atrial fibrillation: Secondary | ICD-10-CM | POA: Diagnosis not present

## 2017-04-27 DIAGNOSIS — N179 Acute kidney failure, unspecified: Secondary | ICD-10-CM | POA: Diagnosis present

## 2017-04-27 DIAGNOSIS — R6521 Severe sepsis with septic shock: Secondary | ICD-10-CM | POA: Diagnosis not present

## 2017-04-27 DIAGNOSIS — E785 Hyperlipidemia, unspecified: Secondary | ICD-10-CM | POA: Diagnosis not present

## 2017-04-27 DIAGNOSIS — I361 Nonrheumatic tricuspid (valve) insufficiency: Secondary | ICD-10-CM | POA: Diagnosis not present

## 2017-04-27 DIAGNOSIS — E46 Unspecified protein-calorie malnutrition: Secondary | ICD-10-CM | POA: Diagnosis present

## 2017-04-27 DIAGNOSIS — F0390 Unspecified dementia without behavioral disturbance: Secondary | ICD-10-CM | POA: Diagnosis not present

## 2017-04-27 DIAGNOSIS — E8770 Fluid overload, unspecified: Secondary | ICD-10-CM | POA: Diagnosis present

## 2017-04-27 DIAGNOSIS — Z9049 Acquired absence of other specified parts of digestive tract: Secondary | ICD-10-CM | POA: Diagnosis not present

## 2017-04-27 DIAGNOSIS — Z823 Family history of stroke: Secondary | ICD-10-CM | POA: Diagnosis not present

## 2017-04-27 DIAGNOSIS — E119 Type 2 diabetes mellitus without complications: Secondary | ICD-10-CM | POA: Diagnosis present

## 2017-04-27 DIAGNOSIS — A419 Sepsis, unspecified organism: Secondary | ICD-10-CM | POA: Diagnosis not present

## 2017-04-27 DIAGNOSIS — Z7982 Long term (current) use of aspirin: Secondary | ICD-10-CM

## 2017-04-27 DIAGNOSIS — I6789 Other cerebrovascular disease: Secondary | ICD-10-CM | POA: Diagnosis not present

## 2017-04-27 DIAGNOSIS — Z6833 Body mass index (BMI) 33.0-33.9, adult: Secondary | ICD-10-CM

## 2017-04-27 DIAGNOSIS — I10 Essential (primary) hypertension: Secondary | ICD-10-CM | POA: Diagnosis not present

## 2017-04-27 DIAGNOSIS — E860 Dehydration: Secondary | ICD-10-CM | POA: Diagnosis present

## 2017-04-27 DIAGNOSIS — Z8673 Personal history of transient ischemic attack (TIA), and cerebral infarction without residual deficits: Secondary | ICD-10-CM

## 2017-04-27 DIAGNOSIS — R6520 Severe sepsis without septic shock: Secondary | ICD-10-CM | POA: Diagnosis not present

## 2017-04-27 HISTORY — DX: Unspecified atrial fibrillation: I48.91

## 2017-04-27 LAB — COMPREHENSIVE METABOLIC PANEL
ALK PHOS: 102 U/L (ref 38–126)
ALK PHOS: 78 U/L (ref 38–126)
ALT: 27 U/L (ref 14–54)
ALT: 21 U/L (ref 14–54)
ANION GAP: 10 (ref 5–15)
AST: 67 U/L — ABNORMAL HIGH (ref 15–41)
AST: 53 U/L — ABNORMAL HIGH (ref 15–41)
Albumin: 2.8 g/dL — ABNORMAL LOW (ref 3.5–5.0)
Albumin: 2 g/dL — ABNORMAL LOW (ref 3.5–5.0)
Anion gap: 11 (ref 5–15)
BILIRUBIN TOTAL: 1 mg/dL (ref 0.3–1.2)
BILIRUBIN TOTAL: 0.6 mg/dL (ref 0.3–1.2)
BUN: 35 mg/dL — ABNORMAL HIGH (ref 6–20)
BUN: 31 mg/dL — ABNORMAL HIGH (ref 6–20)
CALCIUM: 7.9 mg/dL — AB (ref 8.9–10.3)
CALCIUM: 6.8 mg/dL — AB (ref 8.9–10.3)
CO2: 28 mmol/L (ref 22–32)
CO2: 23 mmol/L (ref 22–32)
CREATININE: 3 mg/dL — AB (ref 0.44–1.00)
CREATININE: 2.44 mg/dL — AB (ref 0.44–1.00)
Chloride: 102 mmol/L (ref 101–111)
Chloride: 109 mmol/L (ref 101–111)
GFR, EST AFRICAN AMERICAN: 16 mL/min — AB (ref >60)
GFR, EST AFRICAN AMERICAN: 21 mL/min — AB (ref >60)
GFR, EST NON AFRICAN AMERICAN: 14 mL/min — AB (ref >60)
GFR, EST NON AFRICAN AMERICAN: 18 mL/min — AB (ref >60)
Glucose, Bld: 118 mg/dL — ABNORMAL HIGH (ref 65–99)
Glucose, Bld: 110 mg/dL — ABNORMAL HIGH (ref 65–99)
Potassium: 3.4 mmol/L — ABNORMAL LOW (ref 3.5–5.1)
Potassium: 2.9 mmol/L — ABNORMAL LOW (ref 3.5–5.1)
SODIUM: 142 mmol/L (ref 135–145)
Sodium: 141 mmol/L (ref 135–145)
TOTAL PROTEIN: 4.4 g/dL — AB (ref 6.5–8.1)
Total Protein: 5.8 g/dL — ABNORMAL LOW (ref 6.5–8.1)

## 2017-04-27 LAB — URINALYSIS, ROUTINE W REFLEX MICROSCOPIC
Bilirubin Urine: NEGATIVE
Glucose, UA: NEGATIVE mg/dL
Ketones, ur: NEGATIVE mg/dL
Nitrite: NEGATIVE
Protein, ur: 100 mg/dL — AB
SPECIFIC GRAVITY, URINE: 1.015 (ref 1.005–1.030)
SQUAMOUS EPITHELIAL / LPF: NONE SEEN
pH: 6 (ref 5.0–8.0)

## 2017-04-27 LAB — CBC WITH DIFFERENTIAL/PLATELET
Basophils Absolute: 0 10*3/uL (ref 0.0–0.1)
Basophils Absolute: 0 10*3/uL (ref 0.0–0.1)
Basophils Relative: 0 %
Basophils Relative: 0 %
EOS ABS: 0 10*3/uL (ref 0.0–0.7)
EOS ABS: 0 10*3/uL (ref 0.0–0.7)
EOS PCT: 0 %
Eosinophils Relative: 0 %
HCT: 42.1 % (ref 36.0–46.0)
HCT: 36.6 % (ref 36.0–46.0)
HEMOGLOBIN: 13.5 g/dL (ref 12.0–15.0)
Hemoglobin: 11.6 g/dL — ABNORMAL LOW (ref 12.0–15.0)
LYMPHS ABS: 2 10*3/uL (ref 0.7–4.0)
Lymphocytes Relative: 9 %
Lymphocytes Relative: 9 %
Lymphs Abs: 1.9 10*3/uL (ref 0.7–4.0)
MCH: 27.4 pg (ref 26.0–34.0)
MCH: 26.7 pg (ref 26.0–34.0)
MCHC: 32.1 g/dL (ref 30.0–36.0)
MCHC: 31.7 g/dL (ref 30.0–36.0)
MCV: 85.4 fL (ref 78.0–100.0)
MCV: 84.1 fL (ref 78.0–100.0)
MONO ABS: 1.2 10*3/uL — AB (ref 0.1–1.0)
MONOS PCT: 6 %
Monocytes Absolute: 1.4 10*3/uL — ABNORMAL HIGH (ref 0.1–1.0)
Monocytes Relative: 5 %
NEUTROS PCT: 85 %
Neutro Abs: 18.9 10*3/uL — ABNORMAL HIGH (ref 1.7–7.7)
Neutro Abs: 18.2 10*3/uL — ABNORMAL HIGH (ref 1.7–7.7)
Neutrophils Relative %: 86 %
PLATELETS: 285 10*3/uL (ref 150–400)
Platelets: 272 10*3/uL (ref 150–400)
RBC: 4.93 MIL/uL (ref 3.87–5.11)
RBC: 4.35 MIL/uL (ref 3.87–5.11)
RDW: 14.4 % (ref 11.5–15.5)
RDW: 14.5 % (ref 11.5–15.5)
WBC: 22.2 10*3/uL — AB (ref 4.0–10.5)
WBC: 21.3 10*3/uL — AB (ref 4.0–10.5)

## 2017-04-27 LAB — LACTIC ACID, PLASMA
LACTIC ACID, VENOUS: 1.9 mmol/L (ref 0.5–1.9)
LACTIC ACID, VENOUS: 3.4 mmol/L — AB (ref 0.5–1.9)
Lactic Acid, Venous: 1.6 mmol/L (ref 0.5–1.9)
Lactic Acid, Venous: 2.7 mmol/L (ref 0.5–1.9)

## 2017-04-27 LAB — GLUCOSE, CAPILLARY
GLUCOSE-CAPILLARY: 96 mg/dL (ref 65–99)
GLUCOSE-CAPILLARY: 85 mg/dL (ref 65–99)
GLUCOSE-CAPILLARY: 84 mg/dL (ref 65–99)
GLUCOSE-CAPILLARY: 99 mg/dL (ref 65–99)
Glucose-Capillary: 83 mg/dL (ref 65–99)

## 2017-04-27 LAB — TROPONIN I
TROPONIN I: 2.55 ng/mL — AB (ref <0.03)
TROPONIN I: 0.96 ng/mL — AB (ref <0.03)
TROPONIN I: 0.85 ng/mL — AB (ref <0.03)
Troponin I: 1.2 ng/mL (ref <0.03)

## 2017-04-27 LAB — MRSA PCR SCREENING: MRSA BY PCR: NEGATIVE

## 2017-04-27 LAB — C DIFFICILE QUICK SCREEN W PCR REFLEX
C DIFFICILE (CDIFF) INTERP: NOT DETECTED
C Diff antigen: NEGATIVE
C Diff toxin: NEGATIVE

## 2017-04-27 LAB — I-STAT CG4 LACTIC ACID, ED: Lactic Acid, Venous: 2.01 mmol/L (ref 0.5–1.9)

## 2017-04-27 MED ORDER — SODIUM CHLORIDE 0.9 % IV BOLUS (SEPSIS)
1000.0000 mL | Freq: Once | INTRAVENOUS | Status: AC
  Administered 2017-04-27: 1000 mL via INTRAVENOUS

## 2017-04-27 MED ORDER — LACTATED RINGERS IV BOLUS (SEPSIS)
500.0000 mL | Freq: Once | INTRAVENOUS | Status: AC
  Administered 2017-04-27: 500 mL via INTRAVENOUS

## 2017-04-27 MED ORDER — SODIUM CHLORIDE 0.9 % IV BOLUS (SEPSIS)
500.0000 mL | Freq: Once | INTRAVENOUS | Status: AC
  Administered 2017-04-27: 500 mL via INTRAVENOUS

## 2017-04-27 MED ORDER — ONDANSETRON HCL 4 MG/2ML IJ SOLN: 4.0000 mg | Freq: Four times a day (QID) | INTRAMUSCULAR | Status: DC | PRN

## 2017-04-27 MED ORDER — VASOPRESSIN 20 UNIT/ML IV SOLN
0.0300 [IU]/min | INTRAVENOUS | Status: DC
  Administered 2017-04-27: 0.03 [IU]/min via INTRAVENOUS
  Filled 2017-04-27: qty 2

## 2017-04-27 MED ORDER — SODIUM CHLORIDE 0.9 % IV SOLN
500.0000 mg | Freq: Two times a day (BID) | INTRAVENOUS | Status: DC
  Administered 2017-04-27: 500 mg via INTRAVENOUS
  Filled 2017-04-27 (×2): qty 0.5

## 2017-04-27 MED ORDER — ACETAMINOPHEN 650 MG RE SUPP
650.0000 mg | Freq: Once | RECTAL | Status: AC
  Administered 2017-04-27: 650 mg via RECTAL
  Filled 2017-04-27: qty 1

## 2017-04-27 MED ORDER — METOPROLOL TARTRATE 5 MG/5ML IV SOLN
5.0000 mg | INTRAVENOUS | Status: DC | PRN
  Administered 2017-04-27: 5 mg via INTRAVENOUS
  Filled 2017-04-27: qty 5

## 2017-04-27 MED ORDER — AMIODARONE HCL IN DEXTROSE 360-4.14 MG/200ML-% IV SOLN
INTRAVENOUS | Status: AC
  Filled 2017-04-27: qty 200

## 2017-04-27 MED ORDER — PHENYLEPHRINE HCL 10 MG/ML IJ SOLN
INTRAMUSCULAR | Status: AC
  Filled 2017-04-27: qty 1

## 2017-04-27 MED ORDER — VANCOMYCIN HCL 500 MG IV SOLR: 500.0000 mg | INTRAVENOUS | Status: DC

## 2017-04-27 MED ORDER — LEVALBUTEROL HCL 0.63 MG/3ML IN NEBU
INHALATION_SOLUTION | RESPIRATORY_TRACT | Status: AC
  Administered 2017-04-27: 0.63 mg
  Filled 2017-04-27: qty 3

## 2017-04-27 MED ORDER — INSULIN ASPART 100 UNIT/ML ~~LOC~~ SOLN: 1.0000 [IU] | SUBCUTANEOUS | Status: DC

## 2017-04-27 MED ORDER — POTASSIUM CHLORIDE 10 MEQ/50ML IV SOLN
10.0000 meq | INTRAVENOUS | Status: AC
  Administered 2017-04-27 (×3): 10 meq via INTRAVENOUS
  Filled 2017-04-27 (×4): qty 50

## 2017-04-27 MED ORDER — IPRATROPIUM BROMIDE 0.02 % IN SOLN
RESPIRATORY_TRACT | Status: AC
  Administered 2017-04-27: 0.5 mg
  Filled 2017-04-27: qty 2.5

## 2017-04-27 MED ORDER — SODIUM CHLORIDE 0.9 % IV SOLN
0.0000 ug/min | INTRAVENOUS | Status: DC
  Administered 2017-04-27 (×2): 175 ug/min via INTRAVENOUS
  Administered 2017-04-27: 25 ug/min via INTRAVENOUS
  Administered 2017-04-27: 150 ug/min via INTRAVENOUS
  Administered 2017-04-27: 175 ug/min via INTRAVENOUS
  Administered 2017-04-27: 20 ug/min via INTRAVENOUS
  Administered 2017-04-27: 100 ug/min via INTRAVENOUS
  Administered 2017-04-27: 150 ug/min via INTRAVENOUS
  Filled 2017-04-27 (×12): qty 1

## 2017-04-27 MED ORDER — ASPIRIN 81 MG PO CHEW: 324.0000 mg | CHEWABLE_TABLET | ORAL | Status: AC

## 2017-04-27 MED ORDER — DEXTROSE 5 % IV SOLN
2.0000 g | Freq: Once | INTRAVENOUS | Status: AC
  Administered 2017-04-27: 2 g via INTRAVENOUS
  Filled 2017-04-27: qty 2

## 2017-04-27 MED ORDER — AMIODARONE HCL IN DEXTROSE 360-4.14 MG/200ML-% IV SOLN
30.0000 mg/h | INTRAVENOUS | Status: DC
  Administered 2017-04-27 – 2017-04-28 (×3): 30 mg/h via INTRAVENOUS
  Filled 2017-04-27 (×7): qty 200

## 2017-04-27 MED ORDER — VANCOMYCIN HCL IN DEXTROSE 1-5 GM/200ML-% IV SOLN
1000.0000 mg | Freq: Once | INTRAVENOUS | Status: AC
  Administered 2017-04-27: 1000 mg via INTRAVENOUS
  Filled 2017-04-27: qty 200

## 2017-04-27 MED ORDER — SODIUM CHLORIDE 0.9 % IV SOLN: 250.0000 mL | INTRAVENOUS | Status: DC | PRN

## 2017-04-27 MED ORDER — AMIODARONE LOAD VIA INFUSION
150.0000 mg | Freq: Once | INTRAVENOUS | Status: AC
  Administered 2017-04-27: 150 mg via INTRAVENOUS
  Filled 2017-04-27: qty 83.34

## 2017-04-27 MED ORDER — LACTATED RINGERS IV SOLN
INTRAVENOUS | Status: DC
  Administered 2017-04-27 – 2017-04-29 (×4): via INTRAVENOUS

## 2017-04-27 MED ORDER — DEXTROSE 5 % IV SOLN
0.0000 ug/min | INTRAVENOUS | Status: DC
  Filled 2017-04-27: qty 4

## 2017-04-27 MED ORDER — HEPARIN SODIUM (PORCINE) 5000 UNIT/ML IJ SOLN
5000.0000 [IU] | Freq: Three times a day (TID) | INTRAMUSCULAR | Status: DC
  Administered 2017-04-27 – 2017-05-04 (×22): 5000 [IU] via SUBCUTANEOUS
  Filled 2017-04-27 (×23): qty 1

## 2017-04-27 MED ORDER — AMIODARONE HCL IN DEXTROSE 360-4.14 MG/200ML-% IV SOLN
60.0000 mg/h | INTRAVENOUS | Status: AC
Start: 2017-04-27 — End: 2017-04-27
  Administered 2017-04-27 (×2): 60 mg/h via INTRAVENOUS
  Filled 2017-04-27: qty 200

## 2017-04-27 MED ORDER — ASPIRIN 300 MG RE SUPP: 300.0000 mg | RECTAL | Status: AC

## 2017-04-27 MED ORDER — ASPIRIN 81 MG PO CHEW
81.0000 mg | CHEWABLE_TABLET | Freq: Every day | ORAL | Status: DC
  Administered 2017-04-28 – 2017-05-04 (×7): 81 mg via ORAL
  Filled 2017-04-27 (×7): qty 1

## 2017-04-27 MED ORDER — ACETAMINOPHEN 325 MG PO TABS
650.0000 mg | ORAL_TABLET | Freq: Four times a day (QID) | ORAL | Status: DC | PRN
  Administered 2017-04-27 – 2017-05-04 (×4): 650 mg via ORAL
  Filled 2017-04-27 (×4): qty 2

## 2017-04-27 MED ORDER — SODIUM CHLORIDE 0.9 % IV SOLN
500.0000 mg | INTRAVENOUS | Status: DC
  Administered 2017-04-28: 500 mg via INTRAVENOUS
  Filled 2017-04-27: qty 0.5

## 2017-04-27 MED ORDER — NOREPINEPHRINE BITARTRATE 1 MG/ML IV SOLN
INTRAVENOUS | Status: AC
  Filled 2017-04-27: qty 4

## 2017-04-27 MED ORDER — LEVOFLOXACIN IN D5W 750 MG/150ML IV SOLN
750.0000 mg | Freq: Once | INTRAVENOUS | Status: AC
  Administered 2017-04-27: 750 mg via INTRAVENOUS
  Filled 2017-04-27: qty 150

## 2017-04-27 NOTE — ED Notes (Signed)
CRITICAL VALUE ALERT  Critical Value:  Lactic Acid 3.4  Date & Time Notied:  04/27/17 & 0453 hrs  Provider Notified: Dr. Tomi Bamberger  Orders Received/Actions taken: N/A

## 2017-04-27 NOTE — H&P (Signed)
PULMONARY / CRITICAL CARE MEDICINE   REFERRING MD:  ED CHIEF COMPLAINT:  Septic shock HISTORY OF PRESENT ILLNESS:   76 yr old lady with dementia, atrial fibrillation, coming in with weakness and as per notes her husband noticed that she has been having some lose stools. Upon arrival to the outside hospital ED she was severely hypotensive with SBP 40s. Patient did receive 4 litres of fluids and started on phenylephrine. Patient was also found in atrial fibrillation with RVR and was given metoprolol which worsened her BP. Patient was found to have a UTI.   Upon arrival patient was alert awake but disoriented to place. She is a very poor historian. She is on 100 phenylephrine and is in afib with HR 130-140 and SBP 80s. She denies any pain, shortness of breath or chest pain.   PAST MEDICAL HISTORY :  She  has a past medical history of Anxiety; Arthritis; Atrial fibrillation ([place]); Cancer (HCC) (Cervical); Dementia; Hypertension; and Stroke [facility]).  PAST SURGICAL HISTORY: She  has a past surgical history that includes Knee arthroscopy (Right); Femur Closed Reduction (Right); Appendectomy; Cholecystectomy; Brain surgery; Tonsillectomy; ORIF hip fracture (Right, 10/08/2014); and Intramedullary (im) nail intertrochanteric (Left, 10/19/2015).  Allergies  Allergen Reactions  . Penicillins Other (See Comments)    Child hood allergy(unknown reaction)    No current facility-administered medications on file prior to encounter.    Current Outpatient Prescriptions on File Prior to Encounter  Medication Sig  . albuterol (PROVENTIL) (2.5 MG/3ML) 0.083% nebulizer solution Take 3 mLs (2.5 mg total) by nebulization every 4 (four) hours as needed for wheezing or shortness of breath.  Marland Kitchen aspirin EC 325 MG EC tablet Take 1 tablet (325 mg total) by mouth 2 (two) times daily.  . benazepril (LOTENSIN) 20 MG tablet Take 20 mg by mouth daily. Reported on 10/17/2015  . CALCIUM PO Take 2 tablets by mouth every morning.  .  digoxin (LANOXIN) 0.25 MG tablet Take 1 tablet (0.25 mg total) by mouth daily.  Marland Kitchen diltiazem (CARDIZEM CD) 120 MG 24 hr capsule TK 1 C PO QD  . donepezil (ARICEPT) 10 MG tablet Take 20 mg by mouth at bedtime.  Marland Kitchen doxepin (SINEQUAN) 150 MG capsule Take 150 mg by mouth at bedtime.   . gabapentin (NEURONTIN) 400 MG capsule Take 400 mg by mouth 3 (three) times daily.   Marland Kitchen HYDROcodone-acetaminophen (NORCO) 10-325 MG per tablet Take 1 tablet by mouth 4 (four) times daily.  Marland Kitchen LORazepam (ATIVAN) 0.5 MG tablet Take 1 tablet (0.5 mg total) by mouth every 6 (six) hours as needed for anxiety.  . methocarbamol (ROBAXIN) 500 MG tablet Take 1 tablet (500 mg total) by mouth every 6 (six) hours as needed for muscle spasms.  . metoCLOPramide (REGLAN) 5 MG tablet Take 1-2 tablets (5-10 mg total) by mouth every 8 (eight) hours as needed for nausea (if ondansetron (ZOFRAN) ineffective.).  Marland Kitchen metoprolol (LOPRESSOR) 50 MG tablet Take 1.5 tablets (75 mg total) by mouth every 6 (six) hours.  . pravastatin (PRAVACHOL) 40 MG tablet Take 40 mg by mouth every evening.  . sertraline (ZOLOFT) 100 MG tablet Take 200 mg by mouth every morning.   . zolpidem (AMBIEN) 5 MG tablet TK 1 T PO QD HS    FAMILY HISTORY:  Her indicated that the status of her mother is unknown.    SOCIAL HISTORY: She  reports that she quit smoking about 10 years ago. She has never used smokeless tobacco. She reports that she does not  drink alcohol or use drugs.  REVIEW OF SYSTEMS:   All 11 point system review was unremarkable other than what is mentioned in the HPI  SUBJECTIVE:    VITAL SIGNS: BP (!) 75/58   Pulse 100   Temp 98 F (36.7 C) (Oral)   Resp (!) 22   Ht 5\' 2"  (1.575 m)   Wt 79.1 kg (174 lb 6.1 oz)   SpO2 99%   BMI 31.90 kg/m   HEMODYNAMICS:  on phenylephrine has afib RVR HR 130-140 SBP 85  VENTILATOR SETTINGS:  on [place]  INTAKE / OUTPUT: No intake/output data recorded.  PHYSICAL EXAMINATION: General:  Looks chronically  ill not in distress Neuro:  Alert but disoriented, moving all extremities  HEENT:  Dry mucus membranes  Cardiovascular:  Variable heart sounds no murmurs  Lungs:  Clear equal air sounds bilaterally no crackles  Abdomen:  Soft no tenderness no organomegaly  Musculoskeletal:  No edema  Skin:  No rash   LABS:  BMET  Recent Labs Lab 04/27/17 0108  NA 141  K 3.4*  CL 102  CO2 28  BUN 35*  CREATININE 3.00*  GLUCOSE 118*    Electrolytes  Recent Labs Lab 04/27/17 0108  CALCIUM 7.9*    CBC  Recent Labs Lab 04/27/17 0108  WBC 22.2*  HGB 13.5  HCT 42.1  PLT 272    Coag's No results for input(s): APTT, INR in the last 168 hours.  Sepsis Markers  Recent Labs Lab 04/27/17 0109 04/27/17 0111 04/27/17 0414  LATICACIDVEN 1.9 2.01* 3.4*    ABG No results for input(s): PHART, PCO2ART, PO2ART in the last 168 hours.  Liver Enzymes  Recent Labs Lab 04/27/17 0108  AST 67*  ALT 27  ALKPHOS 102  BILITOT 1.0  ALBUMIN 2.8*    Cardiac Enzymes  Recent Labs Lab 04/27/17 0108  TROPONINI 2.55*    Glucose  Recent Labs Lab 04/27/17 0602  GLUCAP 96    Imaging Dg Chest Port 1 View  Result Date: 04/27/2017 CLINICAL DATA:  Altered mental status EXAM: PORTABLE CHEST 1 VIEW COMPARISON:  10/20/2015 chest radiograph. FINDINGS: Left rotated chest radiograph. Low lung volumes. Stable cardiomediastinal silhouette with top-normal heart size and aortic atherosclerosis. No pneumothorax. No pleural effusion. Vascular crowding without overt pulmonary edema. IMPRESSION: Limited hypoinspiratory rotated chest radiograph. No active cardiopulmonary disease. Electronically Signed   By: Ilona Sorrel M.D.   On: 04/27/2017 01:56     STUDIES:    CULTURES: Urine culture>> Blood culture >>  ANTIBIOTICS: Given aztreonam, levaquin and vanco Will start meropenem and vanco  SIGNIFICANT EVENTS:   LINES/TUBES: Peripheral lines   DISCUSSION: Septic shock most likely from  UTI, afib with RVR , acute kidney injury, hypokalemia, lactic acidosis. Metabolic encephalopathy in th ebackground of dementia.   ASSESSMENT / PLAN:  PULMONARY A: No issues  P:     CARDIOVASCULAR A:  afib with RVR Septic shock on vasopressors  P:  Bolus with amiodarone - wean down phenylephrine as tolerated - if cant get off pressors will need CVC   RENAL A:   Acute renal failure  Hypokalemia UTI  P:   I will bolus with another 531ml of LR Follow urine output and renal function  Replace K Will start meropenem since allergies and multiple admissions to cover for potential ESBL especially with how septic she is  GASTROINTESTINAL A:   diarrhea P:   Will send for c.diff  HEMATOLOGIC A:   Leukocytosis secondary to sepsis  P:  abx as above  INFECTIOUS A:   UTI Septic shock  P:   Follow cultures Repeat lactic acid  Meropenem and vanco   ENDOCRINE A:   DM   P:   SSI  NEUROLOGIC A:   Dementia Metabolic encephalopathy  P:   Treat sepsis   FAMILY  - Updates: no family available   - Inter-disciplinary family meet or Palliative Care meeting due by:  9/7  I have spent 40 mins of CC time bedside or in the unit and this time was exclusive of any billable procedures. Patient is needing ICU due to septic shock requiring vasopressors   Pulmonary and [place] Pager: [phone]  04/27/2017, 6:46 AM

## 2017-04-27 NOTE — Progress Notes (Signed)
Pharmacy Antibiotic Note  Tami [name] is a 76 y.o. female admitted on 04/27/2017 with AMS, possible sepsis. Pharmacy managing meropenem and vancomycin dosing. Patient's SCr remains elevated at 2.44. Minimal urine output. Culture neg so far   Plan: Vancomycin 500  mg IV q48h.  Decrease meropenem to 500 mg IV q24h   Height: 5\' 2"  (157.5 cm) Weight: 174 lb 6.1 oz (79.1 kg) IBW/kg (Calculated) : 50.1  Temp (24hrs), Avg:98.4 F (36.9 C), Min:97.4 F (36.3 C), Max:100.5 F (38.1 C)   Recent Labs Lab 04/27/17 0108 04/27/17 0109 04/27/17 0111 04/27/17 0414 04/27/17 0804 04/27/17 0805  WBC 22.2*  --   --   --  21.3*  --   CREATININE 3.00*  --   --   --  2.44*  --   LATICACIDVEN  --  1.9 2.01* 3.4*  --  1.6    Estimated Creatinine Clearance: 18.5 mL/min (A) (by C-G formula based on SCr of 2.44 mg/dL (H)).    Allergies  Allergen Reactions  . Penicillins Other (See Comments)    Child hood allergy(unknown reaction)    Albertina Parr, PharmD., BCPS Clinical Pharmacist Pager [phone]

## 2017-04-27 NOTE — Progress Notes (Signed)
ELink called regarding hypotension, and uncontrolled A.fibb with rates between 130-150's. Verbal order received to titrate neo up as needed. RN will continue to monitor.

## 2017-04-27 NOTE — ED Provider Notes (Signed)
[place] DEPT Provider Note   CSN: [id number] Arrival date & time: 04/27/17  0058  Time seen 1:02 AM (on arrival)   History   Chief Complaint Chief Complaint  Patient presents with  . Altered Mental Status   Level V caveat for altered mental status  HPI Tami [name] is a 76 y.o. female.  HPI  history is obtained from EMS. They state the husband told them patient had been in bed since 5 AM yesterday morning, August 30 sleeping. He also reports lots of loose watery diarrhea. EMS reported she felt cold when they arrived in her initial blood pressure was in the 30s. It then improved to 60 and just prior to arrival to the ED EMS reports her blood pressure was 99 systolic. They've given her about 400 mL of normal saline. Her CBG was 128 and her heart rate was 150-160 with atrial fibrillation. Her husband said she had a history of sepsis in the past. EMS gave her Rocephin 1 g IM. Patient is resting with her eyes closed however with voice and sternal rub she awakens and she told me "I feel fine". However the nurses said she told them she felt bad.  PCP Sinda Du, MD   Past Medical History:  Diagnosis Date  . Anxiety   . Arthritis   . Atrial fibrillation ([place])   . Cancer (HCC) Cervical  . Dementia   . Hypertension   . Stroke [facility])     Patient Active Problem List   Diagnosis Date Noted  . Intertrochanteric fracture of left femur ([place]) 10/19/2015  . Intertrochanteric fracture of left hip ([place])   . Hip fracture, left ([place]) 10/17/2015  . Chronic atrial fibrillation ([place])   . Fracture, intertrochanteric, left femur ([place])   . Clostridium difficile colitis 10/07/2014  . Atrial fibrillation with RVR ([place]) 10/01/2014  . Fall at home 10/01/2014  . Hip fracture, right ([place]) 10/01/2014  . Atrial fibrillation with rapid ventricular response ([place]) 10/01/2014  . Dementia 10/01/2014  . COLONIC POLYPS, ADENOMATOUS, BENIGN 04/13/2009  . 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  BLADDER CANCER 04/21/2008  . Anxiety state 04/21/2008  . DEPRESSION 04/21/2008  . DEGENERATIVE JOINT DISEASE 04/21/2008  . 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 [place] DISEASE, LUMBOSACRAL SPINE 04/21/2008  . INSOMNIA 04/21/2008  . INCONTINENCE 04/21/2008  . HYPERLIPIDEMIA 02/04/2007  . Essential hypertension 02/04/2007  . GERD 02/04/2007  . DIVERTICULOSIS, COLON 02/04/2007  . COLONIC POLYPS, HX OF 02/04/2007    Past Surgical History:  Procedure Laterality Date  . APPENDECTOMY    . BRAIN SURGERY    . CHOLECYSTECTOMY    . FEMUR CLOSED REDUCTION Right   . INTRAMEDULLARY (IM) NAIL INTERTROCHANTERIC Left 10/19/2015   Procedure: INTERNAL FIXATION LEFT HIP;  Surgeon: Carole Civil, MD;  Location: [facility];  Service: Orthopedics;  Laterality: Left;  . KNEE ARTHROSCOPY Right   . ORIF HIP FRACTURE Right 10/08/2014   Procedure: OPEN REDUCTION INTERNAL FIXATION RIGHT HIP;  Surgeon: Sanjuana Kava, MD;  Location: [facility] ORS;  Service: Orthopedics;  Laterality: Right;  . TONSILLECTOMY      OB History    No data available       Home Medications    Prior to Admission medications   Medication Sig Start Date End Date Taking? Authorizing Provider  albuterol (PROVENTIL) (2.5 MG/3ML) 0.083% nebulizer solution Take 3 mLs (2.5 mg total) by nebulization every 4 (four) hours as needed for wheezing or shortness of breath. 10/11/14   Luan Pulling,  Percell Miller, MD  aspirin EC 325 MG EC tablet Take 1 tablet (325 mg total) by mouth 2 (two) times daily. 10/24/15   Sinda Du, MD  benazepril (LOTENSIN) 20 MG tablet Take 20 mg by mouth daily. Reported on 10/17/2015    [provider]  CALCIUM PO Take 2 tablets by mouth every morning.    [provider]  digoxin (LANOXIN) 0.25 MG tablet Take 1 tablet (0.25 mg total) by mouth daily. 10/24/15   Sinda Du, MD  diltiazem (CARDIZEM CD) 120 MG 24 hr capsule TK 1 C PO QD 08/17/15   [provider]  donepezil  (ARICEPT) 10 MG tablet Take 20 mg by mouth at bedtime.    [provider]  doxepin (SINEQUAN) 150 MG capsule Take 150 mg by mouth at bedtime.  09/02/14   [provider]  gabapentin (NEURONTIN) 400 MG capsule Take 400 mg by mouth 3 (three) times daily.     [provider]  HYDROcodone-acetaminophen (NORCO) 10-325 MG per tablet Take 1 tablet by mouth 4 (four) times daily.    [provider]  LORazepam (ATIVAN) 0.5 MG tablet Take 1 tablet (0.5 mg total) by mouth every 6 (six) hours as needed for anxiety. 10/11/14   Sinda Du, MD  methocarbamol (ROBAXIN) 500 MG tablet Take 1 tablet (500 mg total) by mouth every 6 (six) hours as needed for muscle spasms. 10/11/14   Sinda Du, MD  metoCLOPramide (REGLAN) 5 MG tablet Take 1-2 tablets (5-10 mg total) by mouth every 8 (eight) hours as needed for nausea (if ondansetron (ZOFRAN) ineffective.). 10/24/15   Sinda Du, MD  metoprolol (LOPRESSOR) 50 MG tablet Take 1.5 tablets (75 mg total) by mouth every 6 (six) hours. 10/24/15   Sinda Du, MD  pravastatin (PRAVACHOL) 40 MG tablet Take 40 mg by mouth every evening.    [provider]  sertraline (ZOLOFT) 100 MG tablet Take 200 mg by mouth every morning.     [provider]  zolpidem (AMBIEN) 5 MG tablet TK 1 T PO QD HS 08/05/15   [provider]    Family History Family History  Problem Relation Age of Onset  . Stroke Mother     Social History Social History  Substance Use Topics  . Smoking status: Former Smoker    Quit date: 09/09/2006  . Smokeless tobacco: Never Used  . Alcohol use No  lives at home Lives with spouse   Allergies   Penicillins   Review of Systems Review of Systems  Unable to perform ROS: Mental status change     Physical Exam Updated Vital Signs BP (!) 86/54   Pulse 77   Temp (!) 100.5 F (38.1 C) (Rectal)   Resp 19   SpO2 96%     Vital signs normal except for hypotension,  tachycardia   Physical Exam  Constitutional: She appears well-developed and well-nourished.  Non-toxic appearance. She does not appear ill. She appears distressed.  HENT:  Head: Normocephalic and atraumatic.  Right Ear: External ear normal.  Left Ear: External ear normal.  Nose: Nose normal. No mucosal edema or rhinorrhea.  Mouth/Throat: Oropharynx is clear and moist and mucous membranes are normal. No dental abscesses or uvula swelling.  Eyes: Pupils are equal, round, and reactive to light. Conjunctivae and EOM are normal.  Neck: Normal range of motion and full passive range of motion without pain. Neck supple.  Cardiovascular: Normal rate, regular rhythm and normal heart sounds.  Exam reveals no gallop and  no friction rub.   No murmur heard. Pulmonary/Chest: Effort normal and breath sounds normal. No respiratory distress. She has no wheezes. She has no rhonchi. She has no rales. She exhibits no tenderness and no crepitus.  Abdominal: Soft. Normal appearance and bowel sounds are normal. She exhibits no distension. There is no tenderness. There is no rebound and no guarding.  Musculoskeletal: Normal range of motion. She exhibits no edema or tenderness.  Moves all extremities well.   Neurological: She is alert. She has normal strength. No cranial nerve deficit.  Patient is able to follow some simple commands, her grips are equal.  Skin: Skin is warm, dry and intact. No rash noted. No erythema. There is pallor.  Extremities are cool to touch, the tips of her toes, elbows, heels are purplish in discoloration. Her trunk feels warm to touch. Patient's noted to have a linear purplish area on her back.  Psychiatric: Her speech is delayed. She is slowed.  Nursing note and vitals reviewed.      ED Treatments / Results  Labs (all labs ordered are listed, but only abnormal results are displayed) Results for orders placed or performed during the hospital encounter of 04/27/17  Blood Culture  (routine x 2)  Result Value Ref Range   Specimen Description LEFT ANTECUBITAL    Special Requests      BOTTLES DRAWN AEROBIC AND ANAEROBIC Blood Culture adequate volume   Culture PENDING    Report Status PENDING   Blood Culture (routine x 2)  Result Value Ref Range   Specimen Description BLOOD LEFT HAND    Special Requests      BOTTLES DRAWN AEROBIC ONLY Blood Culture results may not be optimal due to an inadequate volume of blood received in culture bottles   Culture PENDING    Report Status PENDING   Comprehensive metabolic panel  Result Value Ref Range   Sodium 141 135 - 145 mmol/L   Potassium 3.4 (L) 3.5 - 5.1 mmol/L   Chloride 102 101 - 111 mmol/L   CO2 28 22 - 32 mmol/L   Glucose, Bld 118 (H) 65 - 99 mg/dL   BUN 35 (H) 6 - 20 mg/dL   Creatinine, Ser 3.00 (H) 0.44 - 1.00 mg/dL   Calcium 7.9 (L) 8.9 - 10.3 mg/dL   Total Protein 5.8 (L) 6.5 - 8.1 g/dL   Albumin 2.8 (L) 3.5 - 5.0 g/dL   AST 67 (H) 15 - 41 U/L   ALT 27 14 - 54 U/L   Alkaline Phosphatase 102 38 - 126 U/L   Total Bilirubin 1.0 0.3 - 1.2 mg/dL   GFR calc non Af Amer 14 (L) >60 mL/min   GFR calc Af Amer 16 (L) >60 mL/min   Anion gap 11 5 - 15  CBC WITH DIFFERENTIAL  Result Value Ref Range   WBC 22.2 (H) 4.0 - 10.5 K/uL   RBC 4.93 3.87 - 5.11 MIL/uL   Hemoglobin 13.5 12.0 - 15.0 g/dL   HCT 42.1 36.0 - 46.0 %   MCV 85.4 78.0 - 100.0 fL   MCH 27.4 26.0 - 34.0 pg   MCHC 32.1 30.0 - 36.0 g/dL   RDW 14.4 11.5 - 15.5 %   Platelets 272 150 - 400 K/uL   Neutrophils Relative % 85 %   Neutro Abs 18.9 (H) 1.7 - 7.7 K/uL   Lymphocytes Relative 9 %   Lymphs Abs 1.9 0.7 - 4.0 K/uL   Monocytes Relative 6 %   Monocytes  Absolute 1.4 (H) 0.1 - 1.0 K/uL   Eosinophils Relative 0 %   Eosinophils Absolute 0.0 0.0 - 0.7 K/uL   Basophils Relative 0 %   Basophils Absolute 0.0 0.0 - 0.1 K/uL  Lactic acid, plasma  Result Value Ref Range   Lactic Acid, Venous 1.9 0.5 - 1.9 mmol/L  Troponin I  Result Value Ref Range    Troponin I 2.55 (HH) <0.03 ng/mL  I-Stat CG4 Lactic Acid, ED  Result Value Ref Range   Lactic Acid, Venous 2.01 (HH) 0.5 - 1.9 mmol/L   Laboratory interpretation all normal except Mildly elevated lactic acid, positive troponin hopefully from her tachycardia from atrial fibrillation, new acute renal failure, malnutrition, leukocytosis    EKG  EKG Interpretation  Date/Time:  Saturday April 27 2017 01:03:40 EDT Ventricular Rate:  133 PR Interval:    QRS Duration: 88 QT Interval:  306 QTC Calculation: 456 R Axis:   7 Text Interpretation:  Atrial fibrillation with rapid V-rate Since last tracing rate faster Repolarization abnormality, prob rate related now present Confirmed by Rolland Porter [phone]) on 04/27/2017 1:11:26 AM       Radiology Dg Chest Port 1 View  Result Date: 04/27/2017 CLINICAL DATA:  Altered mental status EXAM: PORTABLE CHEST 1 VIEW COMPARISON:  10/20/2015 chest radiograph. FINDINGS: Left rotated chest radiograph. Low lung volumes. Stable cardiomediastinal silhouette with top-normal heart size and aortic atherosclerosis. No pneumothorax. No pleural effusion. Vascular crowding without overt pulmonary edema. IMPRESSION: Limited hypoinspiratory rotated chest radiograph. No active cardiopulmonary disease. Electronically Signed   By: Ilona Sorrel M.D.   On: 04/27/2017 01:56    Procedures Procedures (including critical care time)  CRITICAL CARE Performed by: Aran Menning L Sarrah Fiorenza Total critical care time: 49 minutes Critical care time was exclusive of separately billable procedures and treating other patients. Critical care was necessary to treat or prevent imminent or life-threatening deterioration. Critical care was time spent personally by me on the following activities: development of treatment plan with patient and/or surrogate as well as nursing, discussions with consultants, evaluation of patient's response to treatment, examination of patient, obtaining history from patient or  surrogate, ordering and performing treatments and interventions, ordering and review of laboratory studies, ordering and review of radiographic studies, pulse oximetry and re-evaluation of patient's condition.   Medications Ordered in ED Medications  levofloxacin (LEVAQUIN) IVPB 750 mg (750 mg Intravenous New Bag/Given 04/27/17 0137)  vancomycin (VANCOCIN) IVPB 1000 mg/200 mL premix (1,000 mg Intravenous New Bag/Given 04/27/17 0216)  sodium chloride 0.9 % bolus 1,000 mL (0 mLs Intravenous Stopped 04/27/17 0216)    And  sodium chloride 0.9 % bolus 1,000 mL (0 mLs Intravenous Stopped 04/27/17 0230)    And  sodium chloride 0.9 % bolus 500 mL (0 mLs Intravenous Stopped 04/27/17 0156)  aztreonam (AZACTAM) 2 g in dextrose 5 % 50 mL IVPB (0 g Intravenous Stopped 04/27/17 0216)  levalbuterol (XOPENEX) 0.63 MG/3ML nebulizer solution (0.63 mg  Given 04/27/17 0156)  ipratropium (ATROVENT) 0.02 % nebulizer solution (0.5 mg  Given 04/27/17 0156)  sodium chloride 0.9 % bolus 1,000 mL (1,000 mLs Intravenous New Bag/Given 04/27/17 0244)  sodium chloride 0.9 % bolus 500 mL (500 mLs Intravenous New Bag/Given 04/27/17 0245)  acetaminophen (TYLENOL) suppository 650 mg (650 mg Rectal Given 04/27/17 0244)     Initial Impression / Assessment and Plan / ED Course  I have reviewed the triage vital signs and the nursing notes.  Pertinent labs & imaging results that were available during my care of  the patient were reviewed by me and considered in my medical decision making (see chart for details).    Patient was started on code sepsis orders and antibiotics for unknown source.  When I went back to recheck the patient at 1:15 AM her blood pressure was 77 systolic.  Recheck 1:35 AM blood pressure 107/72 heart rate in the 120s. Patient continues to have some cough and seems to have some difficulty clearing her secretions. She was given a Xopenex reading treatment.  A check at 2:30 AM patient's heart rate was in the 140s, possibly  from the Xopenex. Patient states she's feeling better. However her blood pressure is 92/65. I reviewed her laboratory test results and she has a new acute renal failure, she was given more fluids instead of starting on a pressor. The concern was also that the pressor would make her become more tachycardic. Once her blood pressure is a little bit better I'm going to give her low doses of Lopressor to control her atrial fibrillation with fast ventricular response. Patient finally had some urinary output, it is extremely cloudy and looks like apple cider. So now the etiology of her infection is possibly urinary tract infection.  3:30 AM patient blood pressure was improving, her heart rate was still in the 140 range. She was given Lopressor 5 mg however she dropped her blood pressure to in the 60s. At this point leave Levophed drip was started. I'm going to talk to the hospitalist to see if they feel she should come down to one of [facility] or if she should stay here.  I have reviewed patient's chart and she has a history dementia. She has chronic atrial fibrillation and she is not on anticoagulation due to her frequent falling.  3:39 AM patient discussed with the critical care physician on call, Dr. Elsworth Soho. He states he does not feel she needs to come to [place], he states that there were be no acute cardiology intervention. He recommends starting her on a Neo-Synephrine drip. He will be glad to assist the hospitalist tonight with her care.  03:55 AM Dr Olevia Bowens, hospitalist, does not feel patient should be admitted here  03:57 AM Dr Elsworth Soho, accepts patient in transfer to [facility]  4:10 AM patient's husband is here. He states patient was fine however on August 31 they stayed up to 4:30 in the morning watching TV. He states she slept all day which she thought was because of them staying up so late and then about 6:00 PM he noted that she wasn't as alert and awake and she normally is and she  had soiled herself. He states that is not unusual. He is aware of her transfer to [facility] He states he prefers that she stay here however he was informed that physician here was not comfortable keeping her here.  4:50 AM [facility] is here. Patient's blood pressure is 103/79. Heart rate is 115. Pt is awake and talking, has some confusion. Her breathing is better than when she arrived in the ED.   Final Clinical Impressions(s) / ED Diagnoses   Final diagnoses:  Sepsis, due to unspecified organism ([place])  Diarrhea, unspecified type  Dehydration  Acute cystitis without hematuria  Acute renal injury [facility])  Atrial fibrillation with rapid ventricular response [facility])   Plan admission   Rolland Porter, MD, Barbette Or, MD 04/27/17 [phone]

## 2017-04-27 NOTE — Progress Notes (Signed)
Pharmacy Antibiotic Note  Tami [name] is a 76 y.o. female admitted on 04/27/2017 with AMS, possible sepsis.  Pharmacy has been consulted for Vancomycin and Meropenem dosing.  Vancomycin 1 g IV given in ED at  [place]: Vancomycin 500  mg IV q48h, next dose tomorrow Meropenem 500 mg IV q12h   Height: 5\' 2"  (157.5 cm) Weight: 174 lb 6.1 oz (79.1 kg) IBW/kg (Calculated) : 50.1  Temp (24hrs), Avg:99.3 F (37.4 C), Min:98 F (36.7 C), Max:100.5 F (38.1 C)   Recent Labs Lab 04/27/17 0108 04/27/17 0109 04/27/17 0111 04/27/17 0414  WBC 22.2*  --   --   --   CREATININE 3.00*  --   --   --   LATICACIDVEN  --  1.9 2.01* 3.4*    Estimated Creatinine Clearance: 15.1 mL/min (A) (by C-G formula based on SCr of 3 mg/dL (H)).    Allergies  Allergen Reactions  . Penicillins Other (See Comments)    Child hood allergy(unknown reaction)    Tami [name] 04/27/2017 6:41 AM

## 2017-04-27 NOTE — Progress Notes (Signed)
CRITICAL VALUE ALERT  Critical Value:  Lactic Acid - 2.7  Date & Time Notied:  04/27/17  Provider Notified: [place]ells  Orders Received/Actions taken: New orders placed.

## 2017-04-27 NOTE — Progress Notes (Signed)
Critical troponin called from the lab; value is lower than previously reported. Will continue to monitor. Roselyn Reef Aneeka Bowden,RN

## 2017-04-27 NOTE — Procedures (Signed)
Central Venous Catheter Insertion Procedure Note Tami [name] [id number] May 18, 1941  Procedure: Insertion of Central Venous Catheter Indications: Assessment of intravascular volume  Procedure Details Consent: Risks of procedure as well as the alternatives and risks of each were explained to the (patient/caregiver).  Consent for procedure obtained. Time Out: Verified patient identification, verified procedure, site/side was marked, verified correct patient position, special equipment/implants available, medications/allergies/relevent history reviewed, required imaging and test results available.  Performed  Maximum sterile technique was used including antiseptics, cap, gloves, gown, hand hygiene, mask and sheet. Skin prep: Chlorhexidine; local anesthetic administered A antimicrobial bonded/coated triple lumen catheter was placed in the right internal jugular vein using the Seldinger technique.  Ultrasound was used to verify the patency of the vein and for real time needle guidance.  Evaluation Blood flow good Complications: No apparent complications Patient did tolerate procedure well. Chest X-ray ordered to verify placement.  CXR: pending.  Tami [name] 04/27/2017, 7:45 AM

## 2017-04-27 NOTE — ED Notes (Signed)
Date and time results received: 04/27/17 0151 (use smartphrase ".now" to insert current time)  Test: troponin Critical Value: 2.55  Name of Provider Notified: Dr. Tomi Bamberger notified at [place]? Or Actions Taken?: no/na

## 2017-04-27 NOTE — ED Notes (Signed)
Patient was incontinent of stool, redness noted to posterior thoracic area, bilateral elbow, sacrum area, and bilateral heels.

## 2017-04-28 DIAGNOSIS — I482 Chronic atrial fibrillation: Secondary | ICD-10-CM

## 2017-04-28 LAB — URINE CULTURE

## 2017-04-28 LAB — BLOOD CULTURE ID PANEL (REFLEXED)

## 2017-04-28 LAB — CBC
HEMATOCRIT: 35.2 % — AB (ref 36.0–46.0)
HEMOGLOBIN: 11 g/dL — AB (ref 12.0–15.0)
MCH: 26.6 pg (ref 26.0–34.0)
MCHC: 31.3 g/dL (ref 30.0–36.0)
MCV: 85 fL (ref 78.0–100.0)
Platelets: 231 10*3/uL (ref 150–400)
RBC: 4.14 MIL/uL (ref 3.87–5.11)
RDW: 15 % (ref 11.5–15.5)
WBC: 11.3 10*3/uL — AB (ref 4.0–10.5)

## 2017-04-28 LAB — BASIC METABOLIC PANEL WITH GFR
Anion gap: 8 (ref 5–15)
BUN: 26 mg/dL — ABNORMAL HIGH (ref 6–20)
CO2: 21 mmol/L — ABNORMAL LOW (ref 22–32)
Calcium: 7.2 mg/dL — ABNORMAL LOW (ref 8.9–10.3)
Chloride: 112 mmol/L — ABNORMAL HIGH (ref 101–111)
Creatinine, Ser: 1.56 mg/dL — ABNORMAL HIGH (ref 0.44–1.00)
GFR calc Af Amer: 36 mL/min — ABNORMAL LOW (ref >60)
GFR calc non Af Amer: 31 mL/min — ABNORMAL LOW (ref >60)
Glucose, Bld: 74 mg/dL (ref 65–99)
Potassium: 3.5 mmol/L (ref 3.5–5.1)
Sodium: 141 mmol/L (ref 135–145)

## 2017-04-28 LAB — GLUCOSE, CAPILLARY
GLUCOSE-CAPILLARY: 76 mg/dL (ref 65–99)
Glucose-Capillary: 81 mg/dL (ref 65–99)
Glucose-Capillary: 89 mg/dL (ref 65–99)
Glucose-Capillary: 75 mg/dL (ref 65–99)

## 2017-04-28 MED ORDER — METOPROLOL TARTRATE 12.5 MG HALF TABLET
12.5000 mg | ORAL_TABLET | Freq: Two times a day (BID) | ORAL | Status: DC
  Administered 2017-04-28 – 2017-04-30 (×5): 12.5 mg via ORAL
  Filled 2017-04-28 (×5): qty 1

## 2017-04-28 MED ORDER — VANCOMYCIN HCL IN DEXTROSE 1-5 GM/200ML-% IV SOLN
1000.0000 mg | INTRAVENOUS | Status: DC
  Filled 2017-04-28: qty 200

## 2017-04-28 MED ORDER — DEXTROSE 5 % IV SOLN
1.0000 g | INTRAVENOUS | Status: DC
  Administered 2017-04-28 – 2017-05-04 (×7): 1 g via INTRAVENOUS
  Filled 2017-04-28 (×9): qty 10

## 2017-04-28 MED ORDER — SODIUM CHLORIDE 0.9 % IV SOLN
1.0000 g | Freq: Once | INTRAVENOUS | Status: AC
  Administered 2017-04-28: 1 g via INTRAVENOUS
  Filled 2017-04-28: qty 10

## 2017-04-28 MED ORDER — POTASSIUM CHLORIDE 10 MEQ/50ML IV SOLN: 10.0000 meq | INTRAVENOUS | Status: DC

## 2017-04-28 NOTE — Progress Notes (Signed)
PHARMACY - PHYSICIAN COMMUNICATION CRITICAL VALUE ALERT - BLOOD CULTURE IDENTIFICATION (BCID)  Results for orders placed or performed during the hospital encounter of 04/27/17  Blood Culture ID Panel (Reflexed) (Collected: 04/27/2017  1:39 AM)  Result Value Ref Range   Enterococcus species NOT DETECTED NOT DETECTED   Listeria monocytogenes NOT DETECTED NOT DETECTED   Staphylococcus species DETECTED (A) NOT DETECTED   Staphylococcus aureus NOT DETECTED NOT DETECTED   Methicillin resistance DETECTED (A) NOT DETECTED   Streptococcus species NOT DETECTED NOT DETECTED   Streptococcus agalactiae NOT DETECTED NOT DETECTED   Streptococcus pneumoniae NOT DETECTED NOT DETECTED   Streptococcus pyogenes NOT DETECTED NOT DETECTED   Acinetobacter baumannii NOT DETECTED NOT DETECTED   Enterobacteriaceae species NOT DETECTED NOT DETECTED   Enterobacter cloacae complex NOT DETECTED NOT DETECTED   Escherichia coli NOT DETECTED NOT DETECTED   Klebsiella oxytoca NOT DETECTED NOT DETECTED   Klebsiella pneumoniae NOT DETECTED NOT DETECTED   Proteus species NOT DETECTED NOT DETECTED   Serratia marcescens NOT DETECTED NOT DETECTED   Haemophilus influenzae NOT DETECTED NOT DETECTED   Neisseria meningitidis NOT DETECTED NOT DETECTED   Pseudomonas aeruginosa NOT DETECTED NOT DETECTED   Candida albicans NOT DETECTED NOT DETECTED   Candida glabrata NOT DETECTED NOT DETECTED   Candida krusei NOT DETECTED NOT DETECTED   Candida parapsilosis NOT DETECTED NOT DETECTED   Candida tropicalis NOT DETECTED NOT DETECTED    Name of physician (or Provider) Contacted: Dr. Ashok Cordia  Changes to prescribed antibiotics required: Discontinue Vancomycin   Hildred Laser, Pharm D 04/28/2017 4:54 PM

## 2017-04-28 NOTE — Progress Notes (Signed)
PULMONARY / CRITICAL CARE MEDICINE   Name: Tami [name] MRN: [id number] DOB: February 23, 1941    ADMISSION DATE:  04/27/2017 CONSULTATION DATE:  04/28/2017  REFERRING MD:  Tomi Bamberger EDP  CHIEF COMPLAINT:  Hypotensive, shock  BRIEF:   76 y/o female with mild dementia admitted for evaluation of septic shock from a UTI.   SUBJECTIVE:  Feels better Confused Off pressors  VITAL SIGNS: BP 109/68   Pulse (!) 113   Temp 98 F (36.7 C) (Oral)   Resp (!) 21   Ht 5\' 2"  (1.575 m)   Wt 82.2 kg (181 lb 3.5 oz)   SpO2 97%   BMI 33.15 kg/m   HEMODYNAMICS: CVP:  [6 mmHg-11 mmHg] 6 mmHg  VENTILATOR SETTINGS:    INTAKE / OUTPUT: I/O last 3 completed shifts: In: 10369.7 [I.V.:5419.7; IV Piggyback:4950] Out: 610 [Urine:610]  PHYSICAL EXAMINATION:  General:  Resting comfortably in bed HENT: NCAT OP clear PULM: CTA B, normal effort CV: Irreg irreg, no mgr GI: BS+, soft, nontender MSK: normal bulk and tone Neuro: awake, alert, no distress, MAEW   LABS:  BMET  Recent Labs Lab 04/27/17 0108 04/27/17 0804  NA 141 142  K 3.4* 2.9*  CL 102 109  CO2 28 23  BUN 35* 31*  CREATININE 3.00* 2.44*  GLUCOSE 118* 110*    Electrolytes  Recent Labs Lab 04/27/17 0108 04/27/17 0804  CALCIUM 7.9* 6.8*    CBC  Recent Labs Lab 04/27/17 0108 04/27/17 0804  WBC 22.2* 21.3*  HGB 13.5 11.6*  HCT 42.1 36.6  PLT 272 285    Coag's No results for input(s): APTT, INR in the last 168 hours.  Sepsis Markers  Recent Labs Lab 04/27/17 0414 04/27/17 0805 04/27/17 1059  LATICACIDVEN 3.4* 1.6 2.7*    ABG No results for input(s): PHART, PCO2ART, PO2ART in the last 168 hours.  Liver Enzymes  Recent Labs Lab 04/27/17 0108 04/27/17 0804  AST 67* 53*  ALT 27 21  ALKPHOS 102 78  BILITOT 1.0 0.6  ALBUMIN 2.8* 2.0*    Cardiac Enzymes  Recent Labs Lab 04/27/17 0804 04/27/17 1427 04/27/17 2250  TROPONINI 1.20* 0.96* 0.85*    Glucose  Recent Labs Lab 04/27/17 1526  04/27/17 2053 04/28/17 0014 04/28/17 0414 04/28/17 0803 04/28/17 1134  GLUCAP 84 99 76 81 89 75    Imaging No results found.   STUDIES:    CULTURES: 9/1 blood cultures > GPC 1/2 9/1 Urine culture > multiple species 9/1 C diff negative  ANTIBIOTICS: 9/1 meropenem>  9/1 vanc >   SIGNIFICANT EVENTS: 9/1 admission  LINES/TUBES: 9/1 R IJ CVL >   DISCUSSION: 76 y/o female admitted with sepsis of a urinary source, septic shock and elevated troponin likely due to demand ischemia  ASSESSMENT / PLAN:  PULMONARY A: No acute issues P:   Monitor O2 saturation  CARDIOVASCULAR A:  Septic shock> resolved Elevated troponin> demand ischemia, continue ASA Afib with RVR P:  Tele ASA Continue heparin Echo Stop amiod Start low dose metoprolol Add back dig/cardizem 9/3  RENAL A:   AKI Hypocalcemia P:   Monitor BMET and UOP Replace electrolytes as needed Repeat BMET now  GASTROINTESTINAL A:   Hypoalbuminemia (acute phase reactant?) P:   Start nutrition today  HEMATOLOGIC A:   No acute issues P:  Repeat CBC Monitor for bleeding  INFECTIOUS A:   UTI Septic shock Coag neg staph in blood cultures> likely contanimant P:   F/u urine cultures Stop vanc Stop meropenem Will  start ceftiaxone and watch carefully for allergy, likelihood of reaction low, would prefer to avoid quinolone   ENDOCRINE A:   No acute issues P:   Monitor glucose  NEUROLOGIC A:   Mild dementia P:   Minimize sedating medications   FAMILY  - Updates: None bedside  - Inter-disciplinary family meet or Palliative Care meeting due by:  day 7  Move to med surg  Roselie Awkward, MD [facility] Pager: [phone] Cell: [phone] After 3pm or if no response, call [phone]   04/28/2017, 11:49 AM

## 2017-04-28 NOTE — Progress Notes (Signed)
[facility] Progress Note Patient Name: Tami [name] DOB: Jan 11, 1941 MRN: [id number]   Date of Service  04/28/2017  HPI/Events of Note  Patient admitted with sepsis. Urine culture with multiple species present. Blood culture with gram-positive cocci. Currently only on Rocephin.   eICU Interventions  Ordering vancomycin per pharmacy consult while awaiting culture results      Intervention Category Major Interventions: Sepsis - evaluation and management  Tera Partridge 04/28/2017, 3:36 PM

## 2017-04-28 NOTE — Progress Notes (Signed)
Pharmacy Antibiotic Note  Tami [name] is a 75 y.o. female admitted on 04/27/2017 with bacteremia. Urine culture with multiple species present. Blood culture with gram-positive cocci. Currently only on Rocephin. Pharmacy has been consulted for vancomycin dosing.  Plan: Vancomycin 1,000 mg every 24 hours  Follow up cultures and clinical progress  Height: 5\' 2"  (157.5 cm) Weight: 181 lb 3.5 oz (82.2 kg) IBW/kg (Calculated) : 50.1  Temp (24hrs), Avg:97.7 F (36.5 C), Min:97.5 F (36.4 C), Max:98.1 F (36.7 C)   Recent Labs Lab 04/27/17 0108 04/27/17 0109 04/27/17 0111 04/27/17 0414 04/27/17 0804 04/27/17 0805 04/27/17 1059 04/28/17 1230  WBC 22.2*  --   --   --  21.3*  --   --  11.3*  CREATININE 3.00*  --   --   --  2.44*  --   --  1.56*  LATICACIDVEN  --  1.9 2.01* 3.4*  --  1.6 2.7*  --     Estimated Creatinine Clearance: 29.5 mL/min (A) (by C-G formula based on SCr of 1.56 mg/dL (H)).    Allergies  Allergen Reactions  . Penicillins Other (See Comments)    Child hood allergy(unknown reaction) Tolerated meropenem in September 2018    Antimicrobials this admission: Azreonam 9/1 > 9/1 Levaquin 9/1 > 9/1 Meropenem 9/2 > 9/2  Ceftriaxone 9/2 > Vancomycin 9/1> 9/1 : 9/2 >  Dose adjustments this admission: Renal adjustments of vancomycin (500 mg q48h): renal function has since improved   Microbiology results: 9/1 BCc x2>> 1/2 GPC  0/1 UCx >> multiple species  9/1 Cdiff neg   Thank you for allowing pharmacy to be a part of this patient's care.   Diana L. Kyung Rudd, PharmD, [place] PGY1 Pharmacy Resident Pager: [phone]

## 2017-04-29 ENCOUNTER — Other Ambulatory Visit (HOSPITAL_COMMUNITY): Payer: Medicare HMO

## 2017-04-29 DIAGNOSIS — F0390 Unspecified dementia without behavioral disturbance: Secondary | ICD-10-CM

## 2017-04-29 DIAGNOSIS — N179 Acute kidney failure, unspecified: Secondary | ICD-10-CM

## 2017-04-29 DIAGNOSIS — R197 Diarrhea, unspecified: Secondary | ICD-10-CM

## 2017-04-29 DIAGNOSIS — N3000 Acute cystitis without hematuria: Secondary | ICD-10-CM

## 2017-04-29 DIAGNOSIS — A419 Sepsis, unspecified organism: Principal | ICD-10-CM

## 2017-04-29 DIAGNOSIS — R6521 Severe sepsis with septic shock: Secondary | ICD-10-CM

## 2017-04-29 DIAGNOSIS — I4891 Unspecified atrial fibrillation: Secondary | ICD-10-CM

## 2017-04-29 LAB — CBC WITH DIFFERENTIAL/PLATELET
Basophils Absolute: 0 10*3/uL (ref 0.0–0.1)
Basophils Relative: 0 %
EOS ABS: 0.1 10*3/uL (ref 0.0–0.7)
EOS PCT: 1 %
HCT: 35.7 % — ABNORMAL LOW (ref 36.0–46.0)
HEMOGLOBIN: 11.2 g/dL — AB (ref 12.0–15.0)
LYMPHS ABS: 1.3 10*3/uL (ref 0.7–4.0)
LYMPHS PCT: 12 %
MCH: 26.2 pg (ref 26.0–34.0)
MCHC: 31.4 g/dL (ref 30.0–36.0)
MCV: 83.6 fL (ref 78.0–100.0)
MONOS PCT: 4 %
Monocytes Absolute: 0.5 10*3/uL (ref 0.1–1.0)
Neutro Abs: 8.8 10*3/uL — ABNORMAL HIGH (ref 1.7–7.7)
Neutrophils Relative %: 83 %
PLATELETS: 249 10*3/uL (ref 150–400)
RBC: 4.27 MIL/uL (ref 3.87–5.11)
RDW: 15.1 % (ref 11.5–15.5)
WBC: 10.6 10*3/uL — ABNORMAL HIGH (ref 4.0–10.5)

## 2017-04-29 LAB — CULTURE, BLOOD (ROUTINE X 2)

## 2017-04-29 LAB — BASIC METABOLIC PANEL
Anion gap: 8 (ref 5–15)
BUN: 20 mg/dL (ref 6–20)
CHLORIDE: 113 mmol/L — AB (ref 101–111)
CO2: 22 mmol/L (ref 22–32)
CREATININE: 1.22 mg/dL — AB (ref 0.44–1.00)
Calcium: 7.7 mg/dL — ABNORMAL LOW (ref 8.9–10.3)
GFR calc Af Amer: 48 mL/min — ABNORMAL LOW (ref >60)
GFR calc non Af Amer: 41 mL/min — ABNORMAL LOW (ref >60)
Glucose, Bld: 66 mg/dL (ref 65–99)
Potassium: 3.2 mmol/L — ABNORMAL LOW (ref 3.5–5.1)
Sodium: 143 mmol/L (ref 135–145)

## 2017-04-29 LAB — TROPONIN I: TROPONIN I: 0.38 ng/mL — AB (ref <0.03)

## 2017-04-29 LAB — LACTIC ACID, PLASMA: Lactic Acid, Venous: 0.9 mmol/L (ref 0.5–1.9)

## 2017-04-29 LAB — MAGNESIUM: MAGNESIUM: 1.5 mg/dL — AB (ref 1.7–2.4)

## 2017-04-29 MED ORDER — POTASSIUM CHLORIDE 10 MEQ/50ML IV SOLN
10.0000 meq | INTRAVENOUS | Status: AC
  Administered 2017-04-29 (×4): 10 meq via INTRAVENOUS
  Filled 2017-04-29 (×4): qty 50

## 2017-04-29 MED ORDER — DILTIAZEM HCL ER COATED BEADS 120 MG PO CP24
120.0000 mg | ORAL_CAPSULE | Freq: Every day | ORAL | Status: DC
  Administered 2017-04-29 – 2017-05-03 (×5): 120 mg via ORAL
  Filled 2017-04-29 (×5): qty 1

## 2017-04-29 MED ORDER — DILTIAZEM HCL 100 MG IV SOLR
5.0000 mg/h | INTRAVENOUS | Status: DC
  Administered 2017-04-29: 10 mg/h via INTRAVENOUS
  Administered 2017-04-29: 5 mg/h via INTRAVENOUS
  Filled 2017-04-29 (×2): qty 100

## 2017-04-29 MED ORDER — DIGOXIN 125 MCG PO TABS
0.2500 mg | ORAL_TABLET | Freq: Every day | ORAL | Status: DC
  Administered 2017-04-29 – 2017-05-04 (×6): 0.25 mg via ORAL
  Filled 2017-04-29 (×6): qty 2

## 2017-04-29 MED ORDER — MAGNESIUM SULFATE 2 GM/50ML IV SOLN
2.0000 g | Freq: Once | INTRAVENOUS | Status: AC
  Administered 2017-04-29: 2 g via INTRAVENOUS
  Filled 2017-04-29: qty 50

## 2017-04-29 NOTE — Progress Notes (Signed)
PROGRESS NOTE  Tami [name]  LAG:[id number] DOB: [birth year] DOA: 04/27/2017 PCP: Sinda Du, MD   Brief Narrative: Tami [name] is a 76 y.o. female with a history of dementia and atrial fibrillation who presented on 9/1 with weakness, urinary complaints, and intermittent loose stools. She was hypotensive despite 4L NS, requiring phenylephrine, and UA demonstrated pyuria. AFib with RVR was noted for which rate-control medications were given and she was admitted to the ICU for sepsis due to UTI. Broad spectrum antibiotics were started and blood cultures drawn. Urine culture was noncloncal and blood cultures have grown GPC in 1 of 2, CoNS on rapid ID. Pressors were weaned off 9/2 and the patient was transferred to the SDU on diltiazem gtt for ongoing RVR.   Assessment & Plan: Active Problems:   Atrial fibrillation with RVR (HCC)   Dementia   Sepsis ([place])   Septic shock (HCC)   UTI (urinary tract infection)   AKI (acute kidney injury) ([place])   Diarrhea   Transient alteration of awareness  Septic shock due to UTI source, though urine culture is nonclonal. Shock has resolved, pressors weaned 9/2. Lactic acid normalized.  - Received vancomycin, aztreonam and levaquin 9/1, meropenem 9/1 - 9/2, ceftriaxone 9/2 >>  - BPs improved, DC IVF's; Vascular congestion developing on imaging.  - Monitor culture data.  - Will DC right IJ CVC (placed 9/1) once improving and stable.   1 of 2 blood cultures + CoNS:  - Monitor cultures. Suspect contaminant  AFib with RVR:  - Got amiodarone bolus and infusion 9/1 - 9/2, though this was stopped for unclear reasons with return to RVR. - Started metoprolol 12.5mg  po BID, with improved BPs, will restart home digoxin and diltiazem po.  - Optimizing electrolytes, Ca, Mg, K.  - Still requiring diltiazem gtt. Titrate off gtt as able.  - Echocardiogram ordered.   Acute renal failure: Improving with IVF's, due to sepsis.  - Monitor BMP and UOP  Diarrhea: C.  diff negative.  - Monitor  Demand ischemia: Troponins trending downward. - Continue aspirin, beta blocker.   Dementia: Chronic, stable. Suspect acute metabolic encephalopathy as well.  - Monitor with treatment of sepsis - Delirium precautions   DVT prophylaxis: Subcutaneous heparin Code Status: Full Family Communication: None at bedside Disposition Plan: Continue SDU treatment while requiring diltiazem infusion.   Consultants:   PCCM primary 9/1 - 9/2  Procedures:   9/1 Right IJ CVC  Antimicrobials: Vancomycin, aztreonam and levaquin 9/1 Meropenem 9/1 - 9/2 Ceftriaxone 9/2 >>  Subjective: Pleasantly confused.  Objective: Vitals:   04/29/17 0500 04/29/17 0600 04/29/17 0718 04/29/17 0950  BP: (!) 147/75 127/88 130/77 106/88  Pulse: 97 (!) 111 (!) 109 (!) 124  Resp: (!) 28 (!) 22 17   Temp:   98.4 F (36.9 C)   TempSrc:   Oral   SpO2: 96% 97% 97%   Weight:      Height:        Intake/Output Summary (Last 24 hours) at 04/29/17 1027 Last data filed at 04/29/17 0956  Gross per 24 hour  Intake          3017.77 ml  Output              850 ml  Net          2167.77 ml   Filed Weights   04/27/17 0602 04/28/17 0500 04/29/17 0258  Weight: 79.1 kg (174 lb 6.1 oz) 82.2 kg (181 lb 3.5 oz) 72.1 kg (  158 lb 15.2 oz)    Gen: 76 y.o. female in no distress  Pulm: Non-labored breathing room air. Crackles at bases bilaterally. CV: Irregular, narrow complexes on monitor with rate 100-110. No murmur, rub, or gallop. No JVD, trace pedal edema. GI: Abdomen soft, non-tender, non-distended, with normoactive bowel sounds. No organomegaly or masses felt. Ext: Warm, no deformities Skin: No rashes, lesions no ulcers Neuro: Alert, conversant with fluent speech, confused. No focal neurological deficits. Psych: Judgement and insight appear impaired. Mood & affect appropriate.   Data Reviewed: I have personally reviewed following labs and imaging studies  CBC:  Recent Labs Lab  04/27/17 0108 04/27/17 0804 04/28/17 1230 04/29/17 0443  WBC 22.2* 21.3* 11.3* 10.6*  NEUTROABS 18.9* 18.2*  --  8.8*  HGB 13.5 11.6* 11.0* 11.2*  HCT 42.1 36.6 35.2* 35.7*  MCV 85.4 84.1 85.0 83.6  PLT 272 285 231 932   Basic Metabolic Panel:  Recent Labs Lab 04/27/17 0108 04/27/17 0804 04/28/17 1230 04/29/17 0443  NA 141 142 141 143  K 3.4* 2.9* 3.5 3.2*  CL 102 109 112* 113*  CO2 28 23 21* 22  GLUCOSE 118* 110* 74 66  BUN 35* 31* 26* 20  CREATININE 3.00* 2.44* 1.56* 1.22*  CALCIUM 7.9* 6.8* 7.2* 7.7*  MG  --   --   --  1.5*   GFR: Estimated Creatinine Clearance: 35.3 mL/min (A) (by C-G formula based on SCr of 1.22 mg/dL (H)). Liver Function Tests:  Recent Labs Lab 04/27/17 0108 04/27/17 0804  AST 67* 53*  ALT 27 21  ALKPHOS 102 78  BILITOT 1.0 0.6  PROT 5.8* 4.4*  ALBUMIN 2.8* 2.0*   No results for input(s): LIPASE, AMYLASE in the last 168 hours. No results for input(s): AMMONIA in the last 168 hours. Coagulation Profile: No results for input(s): INR, PROTIME in the last 168 hours. Cardiac Enzymes:  Recent Labs Lab 04/27/17 0108 04/27/17 0804 04/27/17 1427 04/27/17 2250 04/29/17 0443  TROPONINI 2.55* 1.20* 0.96* 0.85* 0.38*   BNP (last 3 results) No results for input(s): PROBNP in the last 8760 hours. HbA1C: No results for input(s): HGBA1C in the last 72 hours. CBG:  Recent Labs Lab 04/27/17 2053 04/28/17 0014 04/28/17 0414 04/28/17 0803 04/28/17 1134  GLUCAP 99 76 81 89 75   Lipid Profile: No results for input(s): CHOL, HDL, LDLCALC, TRIG, CHOLHDL, LDLDIRECT in the last 72 hours. Thyroid Function Tests: No results for input(s): TSH, T4TOTAL, FREET4, T3FREE, THYROIDAB in the last 72 hours. Anemia Panel: No results for input(s): VITAMINB12, FOLATE, FERRITIN, TIBC, IRON, RETICCTPCT in the last 72 hours. Urine analysis:    Component Value Date/Time   COLORURINE YELLOW 04/27/2017 0229   APPEARANCEUR TURBID (A) 04/27/2017 0229    LABSPEC 1.015 04/27/2017 0229   PHURINE 6.0 04/27/2017 0229   GLUCOSEU NEGATIVE 04/27/2017 0229   HGBUR MODERATE (A) 04/27/2017 0229   HGBUR negative 04/21/2008 1015   BILIRUBINUR NEGATIVE 04/27/2017 0229   KETONESUR NEGATIVE 04/27/2017 0229   PROTEINUR 100 (A) 04/27/2017 0229   UROBILINOGEN 0.2 10/01/2014 0040   NITRITE NEGATIVE 04/27/2017 0229   LEUKOCYTESUR SMALL (A) 04/27/2017 0229   Recent Results (from the past 240 hour(s))  Blood Culture (routine x 2)     Status: None (Preliminary result)   Collection Time: 04/27/17  1:09 AM  Result Value Ref Range Status   Specimen Description LEFT ANTECUBITAL  Final   Special Requests   Final    BOTTLES DRAWN AEROBIC AND ANAEROBIC Blood Culture adequate  volume   Culture PENDING  Incomplete   Report Status PENDING  Incomplete  Blood Culture (routine x 2)     Status: None (Preliminary result)   Collection Time: 04/27/17  1:39 AM  Result Value Ref Range Status   Specimen Description BLOOD LEFT HAND  Final   Special Requests   Final    BOTTLES DRAWN AEROBIC ONLY Blood Culture results may not be optimal due to an inadequate volume of blood received in culture bottles   Culture  Setup Time   Final    GRAM POSITIVE COCCI AEB BOTTLE Gram Stain Report Called to,Read Back By and Verified With: ANN TUTTLE , RN AT [facility] ED @ 1105 ON 9.2.18 BY BOWMAN,L DAMETRIA PARKS, RN AT [facility] @ 1125 ON 9.2.18 BY BOWMAN,L CRITICAL RESULT CALLED TO, READ BACK BY AND VERIFIED WITH: A. MAYER, RPHARMD AT 1610 ON 04/28/17 BY C. JESSUP, MLT. Performed at [facility], [place] [address], [place], [place] [address]    Culture GRAM POSITIVE COCCI  Final   Report Status PENDING  Incomplete  Blood Culture ID Panel (Reflexed)     Status: Abnormal   Collection Time: 04/27/17  1:39 AM  Result Value Ref Range Status   Enterococcus species NOT DETECTED NOT DETECTED Final   Listeria monocytogenes NOT DETECTED NOT DETECTED Final   Staphylococcus species DETECTED (A) NOT DETECTED  Final    Comment: Methicillin (oxacillin) resistant coagulase negative staphylococcus. Possible blood culture contaminant (unless isolated from more than one blood culture draw or clinical case suggests pathogenicity). No antibiotic treatment is indicated for blood  culture contaminants. CRITICAL RESULT CALLED TO, READ BACK BY AND VERIFIED WITH: A. MAYER, RPHARMD AT 1610 ON 04/28/17 BY C. JESSUP, MLT.    Staphylococcus aureus NOT DETECTED NOT DETECTED Final   Methicillin resistance DETECTED (A) NOT DETECTED Final    Comment: CRITICAL RESULT CALLED TO, READ BACK BY AND VERIFIED WITH: A. MAYER, RPHARMD AT 1610 ON 04/28/17 BY C. JESSUP, MLT.    Streptococcus species NOT DETECTED NOT DETECTED Final   Streptococcus agalactiae NOT DETECTED NOT DETECTED Final   Streptococcus pneumoniae NOT DETECTED NOT DETECTED Final   Streptococcus pyogenes NOT DETECTED NOT DETECTED Final   Acinetobacter baumannii NOT DETECTED NOT DETECTED Final   Enterobacteriaceae species NOT DETECTED NOT DETECTED Final   Enterobacter cloacae complex NOT DETECTED NOT DETECTED Final   Escherichia coli NOT DETECTED NOT DETECTED Final   Klebsiella oxytoca NOT DETECTED NOT DETECTED Final   Klebsiella pneumoniae NOT DETECTED NOT DETECTED Final   Proteus species NOT DETECTED NOT DETECTED Final   Serratia marcescens NOT DETECTED NOT DETECTED Final   Haemophilus influenzae NOT DETECTED NOT DETECTED Final   Neisseria meningitidis NOT DETECTED NOT DETECTED Final   Pseudomonas aeruginosa NOT DETECTED NOT DETECTED Final   Candida albicans NOT DETECTED NOT DETECTED Final   Candida glabrata NOT DETECTED NOT DETECTED Final   Candida krusei NOT DETECTED NOT DETECTED Final   Candida parapsilosis NOT DETECTED NOT DETECTED Final   Candida tropicalis NOT DETECTED NOT DETECTED Final    Comment: Performed at [facility], [place] [address], [place], [place] [address]  Urine culture     Status: Abnormal   Collection Time: 04/27/17  2:29 AM    Result Value Ref Range Status   Specimen Description URINE, CATHETERIZED  Final   Special Requests NONE  Final   Culture MULTIPLE SPECIES PRESENT, SUGGEST RECOLLECTION (A)  Final   Report Status 04/28/2017 FINAL  Final  MRSA PCR Screening     Status: None   Collection Time: 04/27/17  5:54 AM  Result Value Ref Range Status   MRSA by PCR NEGATIVE NEGATIVE Final    Comment:        The GeneXpert MRSA Assay (FDA approved for NASAL specimens only), is one component of a comprehensive MRSA colonization surveillance program. It is not intended to diagnose MRSA infection nor to guide or monitor treatment for MRSA infections.   C difficile quick scan w PCR reflex     Status: None   Collection Time: 04/27/17  2:18 PM  Result Value Ref Range Status   C Diff antigen NEGATIVE NEGATIVE Final   C Diff toxin NEGATIVE NEGATIVE Final   C Diff interpretation No C. difficile detected.  Final      Radiology Studies: No results found.  Scheduled Meds: . aspirin  81 mg Oral Daily  . heparin  5,000 Units Subcutaneous Q8H  . metoprolol tartrate  12.5 mg Oral BID   Continuous Infusions: . sodium chloride    . cefTRIAXone (ROCEPHIN)  IV Stopped (04/28/17 1406)  . diltiazem (CARDIZEM) infusion 10 mg/hr (04/29/17 0534)  . lactated ringers 125 mL/hr at 04/29/17 0954     LOS: 2 days   Time spent: 35 minutes.  Vance Gather, MD [facility] Pager [phone]  If 7PM-7AM, please contact night-coverage [email or web address] Password [email or web address] 04/29/2017, 10:27 AM

## 2017-04-29 NOTE — Progress Notes (Signed)
[facility] Progress Note Patient Name: Tami [name] DOB: [birth year] MRN: [id number]   Date of Service  04/29/2017  HPI/Events of Note  AFIB with RVR - Ventricular rate = 112 -155. BP = 141/98. Currently in telemetry bed. Nursing informs me that they can't run Cardizem or Amiodarone IV infusions on telemetry, however, these infusions can be run in a stepdown bed.   eICU Interventions  Will order: 1. Send AM BMP now. 2. Mg++ level stat.  3. Cardizem IV infusion. Titrate to HR 65-105. 4. Transfer to stepdown bed.      Intervention Category Major Interventions: Arrhythmia - evaluation and management  Sommer,Steven Eugene 04/29/2017, 1:49 AM

## 2017-04-29 NOTE — Progress Notes (Signed)
Pt transferred to [id number].  Pt belongings sent with pt. Pt husband notified.

## 2017-04-29 NOTE — Progress Notes (Signed)
[facility] Progress Note Patient Name: Tami [name] DOB: 06/02/1941 MRN: [id number]   Date of Service  04/29/2017  HPI/Events of Note  K+ = 3.2, Mg++ = 1.5 and Creatinine = 1.22.   eICU Interventions  Will replace Mg++ and K+.     Intervention Category Major Interventions: Electrolyte abnormality - evaluation and management  Sommer,Steven Eugene 04/29/2017, 5:51 AM

## 2017-04-30 ENCOUNTER — Inpatient Hospital Stay (HOSPITAL_COMMUNITY): Payer: Medicare HMO

## 2017-04-30 DIAGNOSIS — I361 Nonrheumatic tricuspid (valve) insufficiency: Secondary | ICD-10-CM

## 2017-04-30 LAB — ECHOCARDIOGRAM COMPLETE
Height: 62 in
Weight: 2712.54 oz

## 2017-04-30 LAB — BASIC METABOLIC PANEL
Anion gap: 6 (ref 5–15)
BUN: 10 mg/dL (ref 6–20)
CHLORIDE: 108 mmol/L (ref 101–111)
CO2: 29 mmol/L (ref 22–32)
Calcium: 8 mg/dL — ABNORMAL LOW (ref 8.9–10.3)
Creatinine, Ser: 0.82 mg/dL (ref 0.44–1.00)
GFR calc non Af Amer: >60 mL/min (ref >60)
GLUCOSE: 98 mg/dL (ref 65–99)
POTASSIUM: 2.9 mmol/L — AB (ref 3.5–5.1)
Sodium: 143 mmol/L (ref 135–145)

## 2017-04-30 LAB — CBC
HEMATOCRIT: 36 % (ref 36.0–46.0)
HEMOGLOBIN: 11.4 g/dL — AB (ref 12.0–15.0)
MCH: 26.6 pg (ref 26.0–34.0)
MCHC: 31.7 g/dL (ref 30.0–36.0)
MCV: 84.1 fL (ref 78.0–100.0)
Platelets: 255 10*3/uL (ref 150–400)
RBC: 4.28 MIL/uL (ref 3.87–5.11)
RDW: 15.1 % (ref 11.5–15.5)
WBC: 7.5 10*3/uL (ref 4.0–10.5)

## 2017-04-30 LAB — MAGNESIUM: Magnesium: 1.5 mg/dL — ABNORMAL LOW (ref 1.7–2.4)

## 2017-04-30 MED ORDER — DOXEPIN HCL 75 MG PO CAPS
150.0000 mg | ORAL_CAPSULE | Freq: Every day | ORAL | Status: DC
  Administered 2017-04-30 – 2017-05-03 (×4): 150 mg via ORAL
  Filled 2017-04-30 (×6): qty 2

## 2017-04-30 MED ORDER — METOPROLOL TARTRATE 25 MG PO TABS: 25.0000 mg | ORAL_TABLET | Freq: Two times a day (BID) | ORAL | Status: DC

## 2017-04-30 MED ORDER — PRAVASTATIN SODIUM 40 MG PO TABS
40.0000 mg | ORAL_TABLET | Freq: Every evening | ORAL | Status: DC
  Administered 2017-04-30 – 2017-05-03 (×4): 40 mg via ORAL
  Filled 2017-04-30 (×4): qty 1

## 2017-04-30 MED ORDER — DONEPEZIL HCL 10 MG PO TABS
20.0000 mg | ORAL_TABLET | Freq: Every day | ORAL | Status: DC
  Administered 2017-04-30 – 2017-05-03 (×4): 20 mg via ORAL
  Filled 2017-04-30 (×4): qty 2

## 2017-04-30 MED ORDER — FUROSEMIDE 10 MG/ML IJ SOLN
20.0000 mg | Freq: Once | INTRAMUSCULAR | Status: AC
  Administered 2017-04-30: 20 mg via INTRAVENOUS
  Filled 2017-04-30: qty 2

## 2017-04-30 MED ORDER — METOCLOPRAMIDE HCL 5 MG PO TABS
5.0000 mg | ORAL_TABLET | Freq: Three times a day (TID) | ORAL | Status: DC | PRN
  Filled 2017-04-30: qty 2

## 2017-04-30 MED ORDER — MAGNESIUM SULFATE 2 GM/50ML IV SOLN
2.0000 g | Freq: Once | INTRAVENOUS | Status: AC
  Administered 2017-04-30: 2 g via INTRAVENOUS
  Filled 2017-04-30: qty 50

## 2017-04-30 MED ORDER — METOPROLOL TARTRATE 50 MG PO TABS
50.0000 mg | ORAL_TABLET | Freq: Two times a day (BID) | ORAL | Status: DC
  Administered 2017-04-30: 50 mg via ORAL
  Filled 2017-04-30: qty 1

## 2017-04-30 MED ORDER — POTASSIUM CHLORIDE CRYS ER 20 MEQ PO TBCR
40.0000 meq | EXTENDED_RELEASE_TABLET | Freq: Two times a day (BID) | ORAL | Status: AC
  Administered 2017-04-30 (×2): 40 meq via ORAL
  Filled 2017-04-30 (×2): qty 2

## 2017-04-30 NOTE — Plan of Care (Signed)
Problem: Education: Goal: Knowledge of [facility] Education information/materials will improve Outcome: Progressing Discussed with patient plan of care, pain management and transfer to the new department with some teach back displayed.

## 2017-04-30 NOTE — Plan of Care (Signed)
Problem: Pain Managment: Goal: General experience of comfort will improve Outcome: Not Progressing Patients dementia and confusion has worsened since arriving to unit. Patient stays in an agitated and anxious state about where she is and disoriented to the overall situation. She can be distracted with folding towels and reading the menu but constant reassurance is needed.

## 2017-04-30 NOTE — Progress Notes (Signed)
PROGRESS NOTE  Tami [name]  ERX:[id number] DOB: [birth year] DOA: 04/27/2017 PCP: Sinda Du, MD   Brief Narrative: Tami [name] is a 76 y.o. female with a history of dementia and atrial fibrillation who presented on 9/1 with weakness, urinary complaints, and intermittent loose stools. She was hypotensive despite 4L NS, requiring phenylephrine, and UA demonstrated pyuria. AFib with RVR was noted for which rate-control medications were given and she was admitted to the ICU for sepsis due to UTI. Broad spectrum antibiotics were started and blood cultures drawn. Urine culture was noncloncal and blood cultures have grown GPC in 1 of 2, CoNS on rapid ID. Pressors were weaned off 9/2 and the patient was transferred to the SDU on diltiazem gtt for ongoing RVR. BP has improved, oral medications restarted and diltiazem infusion is titrated off 9/4.   Assessment & Plan: Active Problems:   Atrial fibrillation with RVR (HCC)   Dementia   Sepsis ([place])   Septic shock (HCC)   UTI (urinary tract infection)   AKI (acute kidney injury) ([place])   Diarrhea   Transient alteration of awareness  Septic shock due to UTI source, though urine culture is nonclonal. Shock has resolved, pressors weaned 9/2. Lactic acid normalized.  - Received vancomycin, aztreonam and levaquin 9/1, meropenem 9/1 - 9/2, ceftriaxone 9/2 >>  - BPs improved, DC IVF's; Vascular congestion developing on imaging.  - Monitor culture data.  - Will DC right IJ CVC (placed 9/1) once improving and stable.   Acute hypoxic respiratory failure: Due to sepsis, complicated by pulmonary vascular congestion. Echo pending - Continue supplemental oxygen as needed to maintain SpO2 >90% - Give lasix 20mg  IV x1 with echo pending, crackles on exam  1 of 2 blood cultures + CoNS:  - Monitor cultures. Suspect contaminant  AFib with RVR:  - Restarted metoprolol 12.5mg  po BID, with improved BPs, will increase to home dose 50mg  po BID and restart home  digoxin and diltiazem po. Would favor beta blocker over CCB if EF reduced. - Optimizing electrolytes, Ca, Mg, K.  - Titrate off gtt as able.  - Echocardiogram ordered.   Hypokalemia: Refractory to repletion due to GI losses.  - Continue replacement. Goal is K >4 and Mg >2. - Mg borderline, will replace this today as well.   Acute renal failure: Improving with IVF's, due to sepsis.  - Monitor BMP and UOP  Diarrhea: C. diff negative.  - Monitor - DC flexiseal  Demand ischemia: Troponins trending downward. - Continue aspirin, beta blocker.   Hyperlipidemia:  - Restart statin  Dementia: Chronic, stable. Suspect acute metabolic encephalopathy as well.  - Restart aricept, doxepin - Monitor with treatment of sepsis - Delirium precautions   DVT prophylaxis: Subcutaneous heparin Code Status: Full Family Communication: None at bedside Disposition Plan: Continue SDU treatment. If stays off diltiazem gtt, will transfer to floor 9/5.   Consultants:   PCCM primary 9/1 - 9/2  Procedures:   9/1 Right IJ CVC  Antimicrobials: Vancomycin, aztreonam and levaquin 9/1 Meropenem 9/1 - 9/2 Ceftriaxone 9/2 >>  Subjective: Pleasantly confused, denies pain.  Objective: Vitals:   04/30/17 1245 04/30/17 1300 04/30/17 1400 04/30/17 1500  BP: (!) 157/111 (!) 146/89 (!) 148/91 (!) 138/104  Pulse: 75 99 (!) 40 82  Resp: (!) 22 (!) 21 (!) 21 19  Temp: 98.1 F (36.7 C)     TempSrc: Oral     SpO2: 100% (!) 49% (!) 0% (!) 69%  Weight:  Height:        Intake/Output Summary (Last 24 hours) at 04/30/17 1615 Last data filed at 04/30/17 1300  Gross per 24 hour  Intake          1053.51 ml  Output             3075 ml  Net         -2021.49 ml   Filed Weights   04/28/17 0500 04/29/17 0258 04/30/17 0300  Weight: 82.2 kg (181 lb 3.5 oz) 72.1 kg (158 lb 15.2 oz) 76.9 kg (169 lb 8.5 oz)    Gen: 76 y.o. female in no distress  Pulm: Non-labored breathing room air. Crackles at bases  bilaterally. CV: Irregular, narrow complexes on monitor with rate 100-110. No murmur, rub, or gallop. No JVD, trace pedal edema. GI: Abdomen soft, non-tender, non-distended, with normoactive bowel sounds. No organomegaly or masses felt. Ext: Warm, no deformities Skin: No rashes, lesions no ulcers Neuro: Alert, conversant with fluent speech, confused. No focal neurological deficits. Psych: Judgement and insight appear impaired. Mood & affect appropriate.   Data Reviewed: I have personally reviewed following labs and imaging studies  CBC:  Recent Labs Lab 04/27/17 0108 04/27/17 0804 04/28/17 1230 04/29/17 0443 04/30/17 0530  WBC 22.2* 21.3* 11.3* 10.6* 7.5  NEUTROABS 18.9* 18.2*  --  8.8*  --   HGB 13.5 11.6* 11.0* 11.2* 11.4*  HCT 42.1 36.6 35.2* 35.7* 36.0  MCV 85.4 84.1 85.0 83.6 84.1  PLT 272 285 231 249 938   Basic Metabolic Panel:  Recent Labs Lab 04/27/17 0108 04/27/17 0804 04/28/17 1230 04/29/17 0443 04/30/17 0530  NA 141 142 141 143 143  K 3.4* 2.9* 3.5 3.2* 2.9*  CL 102 109 112* 113* 108  CO2 28 23 21* 22 29  GLUCOSE 118* 110* 74 66 98  BUN 35* 31* 26* 20 10  CREATININE 3.00* 2.44* 1.56* 1.22* 0.82  CALCIUM 7.9* 6.8* 7.2* 7.7* 8.0*  MG  --   --   --  1.5* 1.5*   GFR: Estimated Creatinine Clearance: 54.3 mL/min (by C-G formula based on SCr of 0.82 mg/dL). Liver Function Tests:  Recent Labs Lab 04/27/17 0108 04/27/17 0804  AST 67* 53*  ALT 27 21  ALKPHOS 102 78  BILITOT 1.0 0.6  PROT 5.8* 4.4*  ALBUMIN 2.8* 2.0*   No results for input(s): LIPASE, AMYLASE in the last 168 hours. No results for input(s): AMMONIA in the last 168 hours. Coagulation Profile: No results for input(s): INR, PROTIME in the last 168 hours. Cardiac Enzymes:  Recent Labs Lab 04/27/17 0108 04/27/17 0804 04/27/17 1427 04/27/17 2250 04/29/17 0443  TROPONINI 2.55* 1.20* 0.96* 0.85* 0.38*   BNP (last 3 results) No results for input(s): PROBNP in the last 8760  hours. HbA1C: No results for input(s): HGBA1C in the last 72 hours. CBG:  Recent Labs Lab 04/27/17 2053 04/28/17 0014 04/28/17 0414 04/28/17 0803 04/28/17 1134  GLUCAP 99 76 81 89 75   Lipid Profile: No results for input(s): CHOL, HDL, LDLCALC, TRIG, CHOLHDL, LDLDIRECT in the last 72 hours. Thyroid Function Tests: No results for input(s): TSH, T4TOTAL, FREET4, T3FREE, THYROIDAB in the last 72 hours. Anemia Panel: No results for input(s): VITAMINB12, FOLATE, FERRITIN, TIBC, IRON, RETICCTPCT in the last 72 hours. Urine analysis:    Component Value Date/Time   COLORURINE YELLOW 04/27/2017 0229   APPEARANCEUR TURBID (A) 04/27/2017 0229   LABSPEC 1.015 04/27/2017 0229   PHURINE 6.0 04/27/2017 0229   GLUCOSEU NEGATIVE 04/27/2017  0229   HGBUR MODERATE (A) 04/27/2017 0229   HGBUR negative 04/21/2008 1015   BILIRUBINUR NEGATIVE 04/27/2017 0229   KETONESUR NEGATIVE 04/27/2017 0229   PROTEINUR 100 (A) 04/27/2017 0229   UROBILINOGEN 0.2 10/01/2014 0040   NITRITE NEGATIVE 04/27/2017 0229   LEUKOCYTESUR SMALL (A) 04/27/2017 0229   Recent Results (from the past 240 hour(s))  Blood Culture (routine x 2)     Status: None (Preliminary result)   Collection Time: 04/27/17  1:09 AM  Result Value Ref Range Status   Specimen Description LEFT ANTECUBITAL  Final   Special Requests   Final    BOTTLES DRAWN AEROBIC AND ANAEROBIC Blood Culture adequate volume   Culture NO GROWTH 3 DAYS  Final   Report Status PENDING  Incomplete  Blood Culture (routine x 2)     Status: Abnormal   Collection Time: 04/27/17  1:39 AM  Result Value Ref Range Status   Specimen Description BLOOD LEFT HAND  Final   Special Requests   Final    BOTTLES DRAWN AEROBIC ONLY Blood Culture results may not be optimal due to an inadequate volume of blood received in culture bottles   Culture  Setup Time   Final    GRAM POSITIVE COCCI AEB BOTTLE Gram Stain Report Called to,Read Back By and Verified With: ANN TUTTLE , RN AT  [facility] ED @ 1105 ON 9.2.18 BY BOWMAN,L DAMETRIA PARKS, RN AT [facility] @ 1125 ON 9.2.18 BY BOWMAN,L CRITICAL RESULT CALLED TO, READ BACK BY AND VERIFIED WITH: A. MAYER, RPHARMD AT 1610 ON 04/28/17 BY C. JESSUP, MLT.    Culture (A)  Final    STAPHYLOCOCCUS SPECIES (COAGULASE NEGATIVE) THE SIGNIFICANCE OF ISOLATING THIS ORGANISM FROM A SINGLE SET OF BLOOD CULTURES WHEN MULTIPLE SETS ARE DRAWN IS UNCERTAIN. PLEASE NOTIFY THE MICROBIOLOGY DEPARTMENT WITHIN ONE WEEK IF SPECIATION AND SENSITIVITIES ARE REQUIRED. Performed at [facility], [place] [address], [place], [place] [address]    Report Status 04/29/2017 FINAL  Final  Blood Culture ID Panel (Reflexed)     Status: Abnormal   Collection Time: 04/27/17  1:39 AM  Result Value Ref Range Status   Enterococcus species NOT DETECTED NOT DETECTED Final   Listeria monocytogenes NOT DETECTED NOT DETECTED Final   Staphylococcus species DETECTED (A) NOT DETECTED Final    Comment: Methicillin (oxacillin) resistant coagulase negative staphylococcus. Possible blood culture contaminant (unless isolated from more than one blood culture draw or clinical case suggests pathogenicity). No antibiotic treatment is indicated for blood  culture contaminants. CRITICAL RESULT CALLED TO, READ BACK BY AND VERIFIED WITH: A. MAYER, RPHARMD AT 1610 ON 04/28/17 BY C. JESSUP, MLT.    Staphylococcus aureus NOT DETECTED NOT DETECTED Final   Methicillin resistance DETECTED (A) NOT DETECTED Final    Comment: CRITICAL RESULT CALLED TO, READ BACK BY AND VERIFIED WITH: A. MAYER, RPHARMD AT 1610 ON 04/28/17 BY C. JESSUP, MLT.    Streptococcus species NOT DETECTED NOT DETECTED Final   Streptococcus agalactiae NOT DETECTED NOT DETECTED Final   Streptococcus pneumoniae NOT DETECTED NOT DETECTED Final   Streptococcus pyogenes NOT DETECTED NOT DETECTED Final   Acinetobacter baumannii NOT DETECTED NOT DETECTED Final   Enterobacteriaceae species NOT DETECTED NOT DETECTED Final   Enterobacter  cloacae complex NOT DETECTED NOT DETECTED Final   Escherichia coli NOT DETECTED NOT DETECTED Final   Klebsiella oxytoca NOT DETECTED NOT DETECTED Final   Klebsiella pneumoniae NOT DETECTED NOT DETECTED Final   Proteus species NOT DETECTED NOT DETECTED Final  Serratia marcescens NOT DETECTED NOT DETECTED Final   Haemophilus influenzae NOT DETECTED NOT DETECTED Final   Neisseria meningitidis NOT DETECTED NOT DETECTED Final   Pseudomonas aeruginosa NOT DETECTED NOT DETECTED Final   Candida albicans NOT DETECTED NOT DETECTED Final   Candida glabrata NOT DETECTED NOT DETECTED Final   Candida krusei NOT DETECTED NOT DETECTED Final   Candida parapsilosis NOT DETECTED NOT DETECTED Final   Candida tropicalis NOT DETECTED NOT DETECTED Final    Comment: Performed at [facility], [place] [address] [place],  [address]  Urine culture     Status: Abnormal   Collection Time: 04/27/17  2:29 AM  Result Value Ref Range Status   Specimen Description URINE, CATHETERIZED  Final   Special Requests NONE  Final   Culture MULTIPLE SPECIES PRESENT, SUGGEST RECOLLECTION (A)  Final   Report Status 04/28/2017 FINAL  Final  MRSA PCR Screening     Status: None   Collection Time: 04/27/17  5:54 AM  Result Value Ref Range Status   MRSA by PCR NEGATIVE NEGATIVE Final    Comment:        The GeneXpert MRSA Assay (FDA approved for NASAL specimens only), is one component of a comprehensive MRSA colonization surveillance program. It is not intended to diagnose MRSA infection nor to guide or monitor treatment for MRSA infections.   C difficile quick scan w PCR reflex     Status: None   Collection Time: 04/27/17  2:18 PM  Result Value Ref Range Status   C Diff antigen NEGATIVE NEGATIVE Final   C Diff toxin NEGATIVE NEGATIVE Final   C Diff interpretation No C. difficile detected.  Final      Radiology Studies: No results found.  Scheduled Meds: . aspirin  81 mg Oral Daily  . digoxin  0.25 mg Oral  Daily  . diltiazem  120 mg Oral Daily  . heparin  5,000 Units Subcutaneous Q8H  . metoprolol tartrate  12.5 mg Oral BID  . potassium chloride  40 mEq Oral BID   Continuous Infusions: . sodium chloride    . cefTRIAXone (ROCEPHIN)  IV Stopped (04/30/17 1315)  . diltiazem (CARDIZEM) infusion Stopped (04/30/17 2409)  . lactated ringers Stopped (04/29/17 1100)     LOS: 3 days   Time spent: 35 minutes.  Vance Gather, MD [facility] Pager [phone]  If 7PM-7AM, please contact night-coverage [email or web address] Password [email or web address] 04/30/2017, 4:15 PM

## 2017-04-30 NOTE — Care Management Note (Signed)
Case Management Note  Patient Details  Name: Tami [name] MRN: [id number] Date of Birth: March 03, 1941  Subjective/Objective:    From home with spouse, presents with Sepsis secondary to uti, afib with rvr, dementia, cardizem drip dc'd today, will chang to po's, flexiseal dc'd, per husband he is her caregiver and he would prefer for her to go home than to a SNF he was not satisfied with the care she has received at the SNF in the past . She has a hospital bed , walker , w/ chair and bsc at home.  They did have oxygen with [employer] but no longer needed it.                  Action/Plan: NCM will follow for dc needs.   Expected Discharge Date:                  Expected Discharge Plan:     In-House Referral:     Discharge planning Services  CM Consult  Post Acute Care Choice:    Choice offered to:     DME Arranged:    DME Agency:     HH Arranged:    HH Agency:     Status of Service:  In process, will continue to follow  If discussed at Long Length of Stay Meetings, dates discussed:    Additional Comments:  Zenon Mayo, RN 04/30/2017, 3:44 PM

## 2017-04-30 NOTE — Progress Notes (Signed)
  Echocardiogram 2D Echocardiogram has been performed.  Chayla Shands T Saara Kijowski 04/30/2017, 11:51 AM

## 2017-04-30 NOTE — Progress Notes (Addendum)
CRITICAL VALUE ALERT  Critical Value:  K 2.9 & Mg 1.5  Date & Time Notied:  04/30/17 0723   Provider Notified: Dr. Bonner Puna  Orders Received/Actions taken: Will have first shift RN follow-up

## 2017-04-30 NOTE — Progress Notes (Signed)
Pt w/ 3 beat run of vtach. frequent PVCs. Updated attending MD

## 2017-05-01 DIAGNOSIS — E860 Dehydration: Secondary | ICD-10-CM

## 2017-05-01 LAB — BASIC METABOLIC PANEL
ANION GAP: 9 (ref 5–15)
BUN: 5 mg/dL — ABNORMAL LOW (ref 6–20)
CO2: 31 mmol/L (ref 22–32)
Calcium: 8.1 mg/dL — ABNORMAL LOW (ref 8.9–10.3)
Chloride: 104 mmol/L (ref 101–111)
Creatinine, Ser: 0.8 mg/dL (ref 0.44–1.00)
GFR calc Af Amer: >60 mL/min (ref >60)
GFR calc non Af Amer: >60 mL/min (ref >60)
GLUCOSE: 94 mg/dL (ref 65–99)
POTASSIUM: 3.6 mmol/L (ref 3.5–5.1)
Sodium: 144 mmol/L (ref 135–145)

## 2017-05-01 LAB — MAGNESIUM: Magnesium: 1.7 mg/dL (ref 1.7–2.4)

## 2017-05-01 MED ORDER — POTASSIUM CHLORIDE CRYS ER 20 MEQ PO TBCR
40.0000 meq | EXTENDED_RELEASE_TABLET | Freq: Once | ORAL | Status: AC
Start: 2017-05-01 — End: 2017-05-01
  Administered 2017-05-01: 40 meq via ORAL
  Filled 2017-05-01: qty 2

## 2017-05-01 MED ORDER — MAGNESIUM SULFATE 2 GM/50ML IV SOLN
2.0000 g | Freq: Once | INTRAVENOUS | Status: AC
  Administered 2017-05-01: 2 g via INTRAVENOUS
  Filled 2017-05-01: qty 50

## 2017-05-01 MED ORDER — BOOST / RESOURCE BREEZE PO LIQD
1.0000 | Freq: Two times a day (BID) | ORAL | Status: DC
  Administered 2017-05-01 – 2017-05-04 (×7): 1 via ORAL

## 2017-05-01 MED ORDER — FUROSEMIDE 10 MG/ML IJ SOLN
20.0000 mg | Freq: Once | INTRAMUSCULAR | Status: AC
  Administered 2017-05-01: 20 mg via INTRAVENOUS
  Filled 2017-05-01: qty 2

## 2017-05-01 MED ORDER — METOPROLOL TARTRATE 100 MG PO TABS
100.0000 mg | ORAL_TABLET | Freq: Two times a day (BID) | ORAL | Status: DC
  Administered 2017-05-01 – 2017-05-04 (×7): 100 mg via ORAL
  Filled 2017-05-01 (×7): qty 1

## 2017-05-01 NOTE — Progress Notes (Signed)
Initial Nutrition Assessment  DOCUMENTATION CODES:   Obesity unspecified  INTERVENTION:    Boost Breeze po BID, each supplement provides 250 kcal and 9 grams of protein  NUTRITION DIAGNOSIS:   Increased nutrient needs related to acute illness as evidenced by estimated needs  GOAL:   Patient will meet greater than or equal to 90% of their needs  MONITOR:   PO intake, Supplement acceptance, Labs, Weight trends, Skin, I & O's  REASON FOR ASSESSMENT:   Consult Assessment of nutrition requirement/status  ASSESSMENT:   76 y.o. Female with a history of dementia and atrial fibrillation who presented on 9/1 with weakness, urinary complaints, and intermittent loose stools. She was hypotensive despite 4L NS, requiring phenylephrine, and UA demonstrated pyuria. AFib with RVR was noted for which rate-control medications were given and she was admitted to the ICU for sepsis due to UTI.  Pt sleeping soundly upon RD visit.  PO intake poor at 0-35% per flowsheet records. Medications reviewed and include Reglan. Labs reviewed. K 2.9 (L). Mg 1.5 (L). Diarrhea improving.  Unable to complete Nutrition-Focused physical exam at this time.   Diet Order:  Diet regular Room service appropriate? Yes; Fluid consistency: Thin  Skin:  Reviewed, no issues  Last BM:  9/4  Height:   Ht Readings from Last 1 Encounters:  04/29/17 5\' 2"  (1.575 m)    Weight:   Wt Readings from Last 1 Encounters:  05/01/17 170 lb 13.7 oz (77.5 kg)    Ideal Body Weight:  50 kg  BMI:  Body mass index is 31.25 kg/m.  Estimated Nutritional Needs:   Kcal:  1700-1900  Protein:  80-95 gm  Fluid:  1.7-1.9 L  EDUCATION NEEDS:   No education needs identified at this time  Arthur Holms, RD, LDN Pager #: [phone] After-Hours Pager #: [phone]

## 2017-05-01 NOTE — Plan of Care (Signed)
Problem: Pain Managment: Goal: General experience of comfort will improve Outcome: Progressing Pt responds well to repositioning when complaining of back pain.

## 2017-05-01 NOTE — Care Management Important Message (Signed)
Important Message  Patient Details  Name: Tami [name] MRN: [id number] Date of Birth: 04/22/1941   Medicare Important Message Given:  Yes    Tami [name] May 05/01/2017, 9:46 AM

## 2017-05-01 NOTE — Progress Notes (Signed)
Report given to receiving RN. Will transport pt by bed to 5W-03-01

## 2017-05-01 NOTE — Progress Notes (Signed)
PROGRESS NOTE  Tami [name]  YYT:[id number] DOB: Dec 27, 1940 DOA: 04/27/2017 PCP: Sinda Du, MD   Brief Narrative: Tami [name] is a 76 y.o. female with a history of dementia and atrial fibrillation who presented on 9/1 with weakness, urinary complaints, and intermittent loose stools. She was hypotensive despite 4L NS, requiring phenylephrine, and UA demonstrated pyuria. AFib with RVR was noted for which rate-control medications were given and she was admitted to the ICU for sepsis due to UTI. Broad spectrum antibiotics were started and blood cultures drawn. Urine culture was noncloncal and blood cultures have grown GPC in 1 of 2, CoNS on rapid ID. Pressors were weaned off 9/2 and the patient was transferred to the SDU on diltiazem gtt for ongoing RVR. BP has improved, oral medications restarted and diltiazem infusion was titrated off 9/4. Diarrhea has decreased and electrolyte derangements have improved with replacement.    Assessment & Plan: Active Problems:   Atrial fibrillation with RVR (HCC)   Dementia   Sepsis ([place])   Septic shock (HCC)   UTI (urinary tract infection)   AKI (acute kidney injury) ([place])   Diarrhea   Transient alteration of awareness  Septic shock due to UTI source, though urine culture is nonclonal. Shock has resolved, pressors weaned 9/2. Lactic acid normalized.  - Received vancomycin, aztreonam and levaquin 9/1, meropenem 9/1 - 9/2, ceftriaxone 9/2 >>  - BPs improved.  - Monitor culture data.  - Will DC right IJ CVC (placed 9/1)   Acute hypoxic respiratory failure: Due to sepsis, complicated by pulmonary vascular congestion. Echo with preserved EF, no comment on diastolic function due to AFib, no WMA's. Dilated IVC, normal RV structure/function.  - Continue supplemental oxygen as needed to maintain SpO2 >90% - Will repeat lasix 20mg  IV today (4L out with this yesterday and cr improved).   1 of 2 blood cultures + CoNS:  - Monitor cultures. Suspect  contaminant  AFib with RVR: Preserved EF on echo 9/4 - Continue metoprolol at home dose equivalent (100mg  BID instead of 50mg  q6h), digoxin and diltiazem po. - Optimizing electrolytes, Ca, Mg, K.   Hypokalemia: Improving with decreasing GI losses.  - Continue replacement with K and Mg. Goal is K >4 and Mg >2.  Acute renal failure: Resolved, due to sepsis.  - Monitor BMP and UOP  Diarrhea: C. diff negative.  - Monitor, improving.  Demand ischemia: Troponins trending downward. - Continue aspirin, beta blocker.   Hyperlipidemia:  - Restart statin  Dementia: Chronic, stable. Suspect acute metabolic encephalopathy as well.  - Restart aricept, doxepin - Delirium precautions   DVT prophylaxis: Subcutaneous heparin Code Status: Full Family Communication: None at bedside Disposition Plan: Transfer to floor 9/5. Therapy evaluations, has hospital bed at home. Husband would prefer pt to DC home. Possible DC in 24-48 hrs.  Consultants:   PCCM primary 9/1 - 9/2  Procedures:   Right IJ CVC 9/1 - 9/5  Antimicrobials: Vancomycin, aztreonam and levaquin 9/1 Meropenem 9/1 - 9/2 Ceftriaxone 9/2 >>  Subjective: Pleasantly tired, HR within goal range off diltiazem gtt. No chest pain or dyspnea. Has not eaten much. Diarrhea subsiding.  Objective: Vitals:   04/30/17 2359 05/01/17 0000 05/01/17 0307 05/01/17 0400  BP:  139/89  (!) 139/104  Pulse:  73  85  Resp:  (!) 36  (!) 21  Temp: 98.4 F (36.9 C)   97.6 F (36.4 C)  TempSrc: Oral   Axillary  SpO2:  96%  94%  Weight:  77.5 kg (170 lb 13.7 oz)   Height:        Intake/Output Summary (Last 24 hours) at 05/01/17 0816 Last data filed at 05/01/17 0600  Gross per 24 hour  Intake              530 ml  Output             4124 ml  Net            -3594 ml   Filed Weights   04/29/17 0258 04/30/17 0300 05/01/17 0307  Weight: 72.1 kg (158 lb 15.2 oz) 76.9 kg (169 lb 8.5 oz) 77.5 kg (170 lb 13.7 oz)    Gen: 76 y.o. female in no  distress  Pulm: Non-labored breathing room air. Crackles at both bases. CV: Irregular, narrow complexes on monitor with rate 90-100. No murmur, rub, or gallop. No JVD, trace pedal edema. GI: Abdomen soft, non-tender, non-distended, with normoactive bowel sounds. No organomegaly or masses felt. Ext: Warm, no deformities Skin: No rashes, lesions no ulcers Neuro: Tired and confused, though speech is fluent. Very weak, requiring significant assistance to listen to lungs posteriorly. No specific focal neurological deficits. Psych: Judgement and insight appear impaired. Mood & affect appropriate.   Data Reviewed: I have personally reviewed following labs and imaging studies  CBC:  Recent Labs Lab 04/27/17 0108 04/27/17 0804 04/28/17 1230 04/29/17 0443 04/30/17 0530  WBC 22.2* 21.3* 11.3* 10.6* 7.5  NEUTROABS 18.9* 18.2*  --  8.8*  --   HGB 13.5 11.6* 11.0* 11.2* 11.4*  HCT 42.1 36.6 35.2* 35.7* 36.0  MCV 85.4 84.1 85.0 83.6 84.1  PLT 272 285 231 249 627   Basic Metabolic Panel:  Recent Labs Lab 04/27/17 0804 04/28/17 1230 04/29/17 0443 04/30/17 0530 05/01/17 0310  NA 142 141 143 143 144  K 2.9* 3.5 3.2* 2.9* 3.6  CL 109 112* 113* 108 104  CO2 23 21* 22 29 31   GLUCOSE 110* 74 66 98 94  BUN 31* 26* 20 10 5*  CREATININE 2.44* 1.56* 1.22* 0.82 0.80  CALCIUM 6.8* 7.2* 7.7* 8.0* 8.1*  MG  --   --  1.5* 1.5* 1.7   GFR: Estimated Creatinine Clearance: 55.9 mL/min (by C-G formula based on SCr of 0.8 mg/dL). Liver Function Tests:  Recent Labs Lab 04/27/17 0108 04/27/17 0804  AST 67* 53*  ALT 27 21  ALKPHOS 102 78  BILITOT 1.0 0.6  PROT 5.8* 4.4*  ALBUMIN 2.8* 2.0*   No results for input(s): LIPASE, AMYLASE in the last 168 hours. No results for input(s): AMMONIA in the last 168 hours. Coagulation Profile: No results for input(s): INR, PROTIME in the last 168 hours. Cardiac Enzymes:  Recent Labs Lab 04/27/17 0108 04/27/17 0804 04/27/17 1427 04/27/17 2250  04/29/17 0443  TROPONINI 2.55* 1.20* 0.96* 0.85* 0.38*   BNP (last 3 results) No results for input(s): PROBNP in the last 8760 hours. HbA1C: No results for input(s): HGBA1C in the last 72 hours. CBG:  Recent Labs Lab 04/27/17 2053 04/28/17 0014 04/28/17 0414 04/28/17 0803 04/28/17 1134  GLUCAP 99 76 81 89 75   Lipid Profile: No results for input(s): CHOL, HDL, LDLCALC, TRIG, CHOLHDL, LDLDIRECT in the last 72 hours. Thyroid Function Tests: No results for input(s): TSH, T4TOTAL, FREET4, T3FREE, THYROIDAB in the last 72 hours. Anemia Panel: No results for input(s): VITAMINB12, FOLATE, FERRITIN, TIBC, IRON, RETICCTPCT in the last 72 hours. Urine analysis:    Component Value Date/Time   COLORURINE YELLOW 04/27/2017  0229   APPEARANCEUR TURBID (A) 04/27/2017 0229   LABSPEC 1.015 04/27/2017 0229   PHURINE 6.0 04/27/2017 0229   GLUCOSEU NEGATIVE 04/27/2017 0229   HGBUR MODERATE (A) 04/27/2017 0229   HGBUR negative 04/21/2008 1015   BILIRUBINUR NEGATIVE 04/27/2017 0229   KETONESUR NEGATIVE 04/27/2017 0229   PROTEINUR 100 (A) 04/27/2017 0229   UROBILINOGEN 0.2 10/01/2014 0040   NITRITE NEGATIVE 04/27/2017 0229   LEUKOCYTESUR SMALL (A) 04/27/2017 0229   Recent Results (from the past 240 hour(s))  Blood Culture (routine x 2)     Status: None (Preliminary result)   Collection Time: 04/27/17  1:09 AM  Result Value Ref Range Status   Specimen Description LEFT ANTECUBITAL  Final   Special Requests   Final    BOTTLES DRAWN AEROBIC AND ANAEROBIC Blood Culture adequate volume   Culture NO GROWTH 4 DAYS  Final   Report Status PENDING  Incomplete  Blood Culture (routine x 2)     Status: Abnormal   Collection Time: 04/27/17  1:39 AM  Result Value Ref Range Status   Specimen Description BLOOD LEFT HAND  Final   Special Requests   Final    BOTTLES DRAWN AEROBIC ONLY Blood Culture results may not be optimal due to an inadequate volume of blood received in culture bottles   Culture   Setup Time   Final    GRAM POSITIVE COCCI AEB BOTTLE Gram Stain Report Called to,Read Back By and Verified With: ANN TUTTLE , RN AT [facility] ED @ 1105 ON 9.2.18 BY BOWMAN,L DAMETRIA PARKS, RN AT [facility] @ 1125 ON 9.2.18 BY BOWMAN,L CRITICAL RESULT CALLED TO, READ BACK BY AND VERIFIED WITH: A. MAYER, RPHARMD AT 1610 ON 04/28/17 BY C. JESSUP, MLT.    Culture (A)  Final    STAPHYLOCOCCUS SPECIES (COAGULASE NEGATIVE) THE SIGNIFICANCE OF ISOLATING THIS ORGANISM FROM A SINGLE SET OF BLOOD CULTURES WHEN MULTIPLE SETS ARE DRAWN IS UNCERTAIN. PLEASE NOTIFY THE MICROBIOLOGY DEPARTMENT WITHIN ONE WEEK IF SPECIATION AND SENSITIVITIES ARE REQUIRED. Performed at [facility], [place] [address] [place], [place] [address]    Report Status 04/29/2017 FINAL  Final  Blood Culture ID Panel (Reflexed)     Status: Abnormal   Collection Time: 04/27/17  1:39 AM  Result Value Ref Range Status   Enterococcus species NOT DETECTED NOT DETECTED Final   Listeria monocytogenes NOT DETECTED NOT DETECTED Final   Staphylococcus species DETECTED (A) NOT DETECTED Final    Comment: Methicillin (oxacillin) resistant coagulase negative staphylococcus. Possible blood culture contaminant (unless isolated from more than one blood culture draw or clinical case suggests pathogenicity). No antibiotic treatment is indicated for blood  culture contaminants. CRITICAL RESULT CALLED TO, READ BACK BY AND VERIFIED WITH: A. MAYER, RPHARMD AT 1610 ON 04/28/17 BY C. JESSUP, MLT.    Staphylococcus aureus NOT DETECTED NOT DETECTED Final   Methicillin resistance DETECTED (A) NOT DETECTED Final    Comment: CRITICAL RESULT CALLED TO, READ BACK BY AND VERIFIED WITH: A. MAYER, RPHARMD AT 1610 ON 04/28/17 BY C. JESSUP, MLT.    Streptococcus species NOT DETECTED NOT DETECTED Final   Streptococcus agalactiae NOT DETECTED NOT DETECTED Final   Streptococcus pneumoniae NOT DETECTED NOT DETECTED Final   Streptococcus pyogenes NOT DETECTED NOT DETECTED Final    Acinetobacter baumannii NOT DETECTED NOT DETECTED Final   Enterobacteriaceae species NOT DETECTED NOT DETECTED Final   Enterobacter cloacae complex NOT DETECTED NOT DETECTED Final   Escherichia coli NOT DETECTED NOT DETECTED Final  Klebsiella oxytoca NOT DETECTED NOT DETECTED Final   Klebsiella pneumoniae NOT DETECTED NOT DETECTED Final   Proteus species NOT DETECTED NOT DETECTED Final   Serratia marcescens NOT DETECTED NOT DETECTED Final   Haemophilus influenzae NOT DETECTED NOT DETECTED Final   Neisseria meningitidis NOT DETECTED NOT DETECTED Final   Pseudomonas aeruginosa NOT DETECTED NOT DETECTED Final   Candida albicans NOT DETECTED NOT DETECTED Final   Candida glabrata NOT DETECTED NOT DETECTED Final   Candida krusei NOT DETECTED NOT DETECTED Final   Candida parapsilosis NOT DETECTED NOT DETECTED Final   Candida tropicalis NOT DETECTED NOT DETECTED Final    Comment: Performed at [facility], [place] [address] [place], [place] [address]  Urine culture     Status: Abnormal   Collection Time: 04/27/17  2:29 AM  Result Value Ref Range Status   Specimen Description URINE, CATHETERIZED  Final   Special Requests NONE  Final   Culture MULTIPLE SPECIES PRESENT, SUGGEST RECOLLECTION (A)  Final   Report Status 04/28/2017 FINAL  Final  MRSA PCR Screening     Status: None   Collection Time: 04/27/17  5:54 AM  Result Value Ref Range Status   MRSA by PCR NEGATIVE NEGATIVE Final    Comment:        The GeneXpert MRSA Assay (FDA approved for NASAL specimens only), is one component of a comprehensive MRSA colonization surveillance program. It is not intended to diagnose MRSA infection nor to guide or monitor treatment for MRSA infections.   C difficile quick scan w PCR reflex     Status: None   Collection Time: 04/27/17  2:18 PM  Result Value Ref Range Status   C Diff antigen NEGATIVE NEGATIVE Final   C Diff toxin NEGATIVE NEGATIVE Final   C Diff interpretation No C. difficile  detected.  Final      Radiology Studies: No results found.  Scheduled Meds: . aspirin  81 mg Oral Daily  . digoxin  0.25 mg Oral Daily  . diltiazem  120 mg Oral Daily  . donepezil  20 mg Oral QHS  . doxepin  150 mg Oral QHS  . heparin  5,000 Units Subcutaneous Q8H  . metoprolol tartrate  50 mg Oral BID  . potassium chloride  40 mEq Oral Once  . pravastatin  40 mg Oral QPM   Continuous Infusions: . sodium chloride    . cefTRIAXone (ROCEPHIN)  IV Stopped (04/30/17 1315)  . lactated ringers Stopped (04/29/17 1100)  . magnesium sulfate 1 - 4 g bolus IVPB       LOS: 4 days   Time spent: 25 minutes.  Vance Gather, MD [facility] Pager [phone]  If 7PM-7AM, please contact night-coverage [email or web address] Password [email or web address] 05/01/2017, 8:16 AM

## 2017-05-01 NOTE — Progress Notes (Signed)
Spoke w/ pts husband and provided updates on pt. Pts husband requesting he is called when pt transferred.  Pts husband expressed concerns regarding how pt will be transported when d/c'd as pt can not walk and he has no way of getting her in and out of the house.  Pts husband also requesting home health at discharge and states he has checked w/ his insurance which should cover this.  Stated that I will contact social work/case management to relay. Left vm for [place], CSW regarding this.

## 2017-05-01 NOTE — Progress Notes (Signed)
Attempted to call report to receiving RN on 5W. RN unable to take report at this time. Provided my number for call back.

## 2017-05-02 LAB — CULTURE, BLOOD (ROUTINE X 2)
CULTURE: NO GROWTH
SPECIAL REQUESTS: ADEQUATE

## 2017-05-02 LAB — BASIC METABOLIC PANEL
ANION GAP: 12 (ref 5–15)
BUN: 7 mg/dL (ref 6–20)
CALCIUM: 8.4 mg/dL — AB (ref 8.9–10.3)
CO2: 33 mmol/L — ABNORMAL HIGH (ref 22–32)
CREATININE: 0.77 mg/dL (ref 0.44–1.00)
Chloride: 99 mmol/L — ABNORMAL LOW (ref 101–111)
Glucose, Bld: 100 mg/dL — ABNORMAL HIGH (ref 65–99)
Potassium: 3.3 mmol/L — ABNORMAL LOW (ref 3.5–5.1)
Sodium: 144 mmol/L (ref 135–145)

## 2017-05-02 MED ORDER — POTASSIUM CHLORIDE CRYS ER 20 MEQ PO TBCR
40.0000 meq | EXTENDED_RELEASE_TABLET | Freq: Once | ORAL | Status: AC
  Administered 2017-05-02: 40 meq via ORAL
  Filled 2017-05-02: qty 2

## 2017-05-02 MED ORDER — FUROSEMIDE 10 MG/ML IJ SOLN
20.0000 mg | Freq: Once | INTRAMUSCULAR | Status: AC
Start: 2017-05-02 — End: 2017-05-02
  Administered 2017-05-02: 20 mg via INTRAVENOUS
  Filled 2017-05-02: qty 2

## 2017-05-02 NOTE — Evaluation (Signed)
Physical Therapy Evaluation Patient Details Name: Tami [name] MRN: [id number] DOB: [birth year] Today's Date: 05/02/2017   History of Present Illness  76 yr old lady with dementia, atrial fibrillation, coming in with weakness and as per notes her husband noticed that she has been having some lose stools. Upon arrival to the outside hospital ED she was severely hypotensive with SBP 40s. Patient did receive 4 litres of fluids and started on phenylephrine. Patient was also found in atrial fibrillation with RVR and was given metoprolol which worsened her BP. Patient was found to have a UTI.  Pt admitted w/ dx: Sepsis  Clinical Impression  Pt admitted with/for general weakness, afib with RVR and UTI.  Pt currently needs 2 person total assist when not using lift equipment.  I did not get an idea of pt's PLOF, but led to believe pt's husband was primary caregiver.  He will need to be able to provide 24 hour care.  Pt currently limited functionally due to the problems listed. ( See problems list.)   Pt will benefit from PT to maximize function and safety in order to get ready for next venue listed below.     Follow Up Recommendations SNF;Other (comment) (Though somewhere, the chart stated pt's husband would take pt home, refusing SNF)    Equipment Recommendations  Other (comment) (TBA)    Recommendations for Other Services       Precautions / Restrictions Precautions Precautions: Fall Restrictions Weight Bearing Restrictions: No      Mobility  Bed Mobility Overal bed mobility: Needs Assistance Bed Mobility: Supine to Sit     Supine to sit: Max assist;+2 for safety/equipment;+2 for physical assistance     General bed mobility comments: pt gently cued through sequences of getting to EOB with each facet able to agitate pt or cause pain that would agitate he, but then she could be calmed and assisted further to EOB (little assist by pt)  Transfers Overall transfer level: Needs assistance    Transfers: Squat Pivot Transfers     Squat pivot transfers: Total assist;+2 physical assistance     General transfer comment: pt with squat pivot to chair using bed pad to give pt the feel of more security.  Pt still was more anxious and agitaed that helpful with the transfer to recliner.  Ambulation/Gait                Stairs            Wheelchair Mobility    Modified Rankin (Stroke Patients Only)       Balance Overall balance assessment: Needs assistance Sitting-balance support: Feet supported;Single extremity supported Sitting balance-Leahy Scale: Poor Sitting balance - Comments: tending to list L in general needing assist to lean R initially.  Preferred left positioning in the recliner.     Standing balance-Leahy Scale: Zero                               Pertinent Vitals/Pain Pain Assessment: Faces Faces Pain Scale: Hurts even more Pain Location: joints and back Pain Descriptors / Indicators: Aching;Grimacing;Moaning;Sore Pain Intervention(s): Limited activity within patient's tolerance;Monitored during session;Repositioned    Home Living Family/patient expects to be discharged to:: Private residence (No family present during OT Eval, pt w/ h/o dementia and not reliable historian. Cont to assess.) Living Arrangements: Spouse/significant other Available Help at Discharge:  (No family present during OT Eval, pt w/ h/o dementia and not  reliable historian. Cont to assess.)                  Prior Function Level of Independence: Needs assistance      ADL's / Homemaking Assistance Needed: Per pr an dchart review: Husband assists with ADL's and IADL's. No fmaily present during Eval. Continue to assess as MD note indicates that husband would like pt to go home and not to SNF.        Hand Dominance   Dominant Hand: Right    Extremity/Trunk Assessment   Upper Extremity Assessment Upper Extremity Assessment: Defer to OT evaluation     Lower Extremity Assessment Lower Extremity Assessment: RLE deficits/detail;LLE deficits/detail RLE Deficits / Details: generally weak with gross extension of 3/5 and gross flexion 2/5,  RLE Coordination: decreased fine motor;decreased gross motor LLE Deficits / Details: notably weaker than R LE with strength on MMT of 2 to 2+ generally       Communication   Communication: No difficulties  Cognition Arousal/Alertness: Awake/alert Behavior During Therapy: Agitated;Restless;Flat affect Overall Cognitive Status: History of cognitive impairments - at baseline                                        General Comments General comments (skin integrity, edema, etc.): pt's neck held at betw 30 and 40 * right rotation and pt will not tolerate rotation to neutral more than about 20*    Exercises     Assessment/Plan    PT Assessment Patient needs continued PT services  PT Problem List Decreased strength;Decreased activity tolerance;Decreased range of motion;Decreased balance;Decreased mobility;Decreased knowledge of use of DME;Pain       PT Treatment Interventions DME instruction;Gait training;Functional mobility training;Therapeutic activities;Balance training;Therapeutic exercise;Patient/family education    PT Goals (Current goals can be found in the Care Plan section)  Acute Rehab PT Goals PT Goal Formulation: Patient unable to participate in goal setting Potential to Achieve Goals: Fair    Frequency Min 3X/week   Barriers to discharge        Co-evaluation PT/OT/SLP Co-Evaluation/Treatment: Yes Reason for Co-Treatment: Necessary to address cognition/behavior during functional activity;Complexity of the patient's impairments (multi-system involvement);For patient/therapist safety PT goals addressed during session: Mobility/safety with mobility OT goals addressed during session: ADL's and self-care       AM-PAC PT "6 Clicks" Daily Activity  Outcome Measure  Difficulty turning over in bed (including adjusting bedclothes, sheets and blankets)?: Unable Difficulty moving from lying on back to sitting on the side of the bed? : Unable Difficulty sitting down on and standing up from a chair with arms (e.g., wheelchair, bedside commode, etc,.)?: Unable Help needed moving to and from a bed to chair (including a wheelchair)?: Total Help needed walking in hospital room?: Total Help needed climbing 3-5 steps with a railing? : Total 6 Click Score: 6    End of Session   Activity Tolerance: Patient limited by pain Patient left: in chair;with call bell/phone within reach;with chair alarm set Nurse Communication: Mobility status PT Visit Diagnosis: Other abnormalities of gait and mobility (R26.89);Muscle weakness (generalized) (M62.81);Pain Pain - Right/Left:  (bil) Pain - part of body:  (neck, legs )    Time: 5366-4403 PT Time Calculation (min) (ACUTE ONLY): 29 min   Charges:   PT Evaluation $PT Eval Moderate Complexity: 1 Mod     PT G Codes:        05-29-17  Donnella Sham, PT [phone]  (pager)  Tessie Fass Darel Ricketts 05/02/2017, 11:38 AM

## 2017-05-02 NOTE — Progress Notes (Signed)
Physical Therapy Treatment Patient Details Name: Tami [name] MRN: [id number] DOB: 14-Aug-1941 Today's Date: 05/02/2017    History of Present Illness 76 yr old lady with dementia, atrial fibrillation, coming in with weakness and as per notes her husband noticed that she has been having some lose stools. Upon arrival to the outside hospital ED she was severely hypotensive with SBP 40s. Patient did receive 4 litres of fluids and started on phenylephrine. Patient was also found in atrial fibrillation with RVR and was given metoprolol which worsened her BP. Patient was found to have a UTI.  Pt admitted w/ dx: Sepsis    PT Comments    Emphasized cues and assist to allow pt to help with transfer back to bed in preparation for a procedure.  Due to incontinence, pt had to move her legs and roll multiple times for pericare, turning it into a moderate activity.   Follow Up Recommendations  SNF     Equipment Recommendations  Other (comment) (TBA)    Recommendations for Other Services       Precautions / Restrictions Precautions Precautions: Fall    Mobility  Bed Mobility Overal bed mobility: Needs Assistance Bed Mobility: Sit to Supine;Rolling Rolling: Mod assist     Sit to supine: Max assist;+2 for safety/equipment   General bed mobility comments: pt was incontinent of stool on return to bed and was 2 person assist to supine and then rolled with cues for sequence and slow assist so pt could participate.  Transfers Overall transfer level: Needs assistance   Transfers: Squat Pivot Transfers     Squat pivot transfers: Total assist;+2 physical assistance     General transfer comment: squat pivot using pad and keeping assist slow so pt could assist as much as possible  Ambulation/Gait                 Stairs            Wheelchair Mobility    Modified Rankin (Stroke Patients Only)       Balance                                             Cognition Arousal/Alertness: Awake/alert Behavior During Therapy: Agitated;Restless;Flat affect Overall Cognitive Status: History of cognitive impairments - at baseline                                 General Comments: PMH dementia. No family present during assessment      Exercises      General Comments        Pertinent Vitals/Pain Pain Assessment: Faces Faces Pain Scale: Hurts even more Pain Location: "Everywhere" per pt with bed mobility    Home Living                      Prior Function            PT Goals (current goals can now be found in the care plan section) Acute Rehab PT Goals Patient Stated Goal: Unable to state PT Goal Formulation: Patient unable to participate in goal setting Potential to Achieve Goals: Fair Progress towards PT goals: Progressing toward goals    Frequency    Min 3X/week      PT Plan Current plan remains appropriate  Co-evaluation              AM-PAC PT "6 Clicks" Daily Activity  Outcome Measure  Difficulty turning over in bed (including adjusting bedclothes, sheets and blankets)?: Unable Difficulty moving from lying on back to sitting on the side of the bed? : Unable Difficulty sitting down on and standing up from a chair with arms (e.g., wheelchair, bedside commode, etc,.)?: Unable Help needed moving to and from a bed to chair (including a wheelchair)?: Total Help needed walking in hospital room?: Total Help needed climbing 3-5 steps with a railing? : Total 6 Click Score: 6    End of Session   Activity Tolerance: Patient limited by pain Patient left: in bed;with call bell/phone within reach;with nursing/sitter in room Nurse Communication: Mobility status PT Visit Diagnosis: Other abnormalities of gait and mobility (R26.89);Muscle weakness (generalized) (M62.81);Pain     Time: 1210-1223 PT Time Calculation (min) (ACUTE ONLY): 13 min  Charges:  $Therapeutic Activity: 8-22 mins                     G Codes:       2017-05-24  Donnella Sham, PT [phone]  (pager)   Tessie Fass Jaquin Coy 24-May-2017, 5:50 PM

## 2017-05-02 NOTE — Evaluation (Signed)
Occupational Therapy Evaluation Patient Details Name: Tami [name] MRN: [id number] DOB: 1940/12/26 Today's Date: 05/02/2017    History of Present Illness 76 yr old lady with dementia, atrial fibrillation, coming in with weakness and as per notes her husband noticed that she has been having some lose stools. Upon arrival to the outside hospital ED she was severely hypotensive with SBP 40s. Patient did receive 4 litres of fluids and started on phenylephrine. Patient was also found in atrial fibrillation with RVR and was given metoprolol which worsened her BP. Patient was found to have a UTI.  Pt admitted w/ dx: Sepsis   Clinical Impression   Pt admitted as above. Currently demonstrates significant deficits w/ ADL's and functional mobility/transfers (see OT problem list below). Per chart review, pt husband declining SNF Rehab due to previous experience, however, pt requires +2 total 24/7 assist for transfers and self care tasks. No family present during assessment to determine level care available at home. Will follow acutely w/ focus on ADL's, family education and discussion re:d/c planning for pt safety.     Follow Up Recommendations  SNF;Supervision/Assistance - 24 hour;Other (comment) (Per chart review, pt husband would like pt to return home and not SNF. No family present during assessment to determine level of assistance or equipment available)    Equipment Recommendations  Other (comment) (Cont to assess. Need family input)    Recommendations for Other Services       Precautions / Restrictions Precautions Precautions: Fall Restrictions Weight Bearing Restrictions: No      Mobility Bed Mobility Overal bed mobility: Needs Assistance Bed Mobility: Supine to Sit     Supine to sit: Max assist;+2 for safety/equipment;+2 for physical assistance     General bed mobility comments: pt gently cued through sequences of getting to EOB with each facet able to agitate pt or cause pain that  would agitate he, but then she could be calmed and assisted further to EOB (little assist by pt)  Transfers Overall transfer level: Needs assistance   Transfers: Squat Pivot Transfers     Squat pivot transfers: Total assist;+2 physical assistance     General transfer comment: pt with squat pivot to chair using bed pad to give pt the feel of more security.  Pt still was more anxious and agitaed that helpful with the transfer to recliner.    Balance Overall balance assessment: Needs assistance Sitting-balance support: Feet supported;Single extremity supported Sitting balance-Leahy Scale: Poor Sitting balance - Comments: tending to list L in general needing assist to lean R initially.  Preferred left positioning in the recliner.     Standing balance-Leahy Scale: Zero                             ADL either performed or assessed with clinical judgement   ADL Overall ADL's : Needs assistance/impaired (Unsure of pt baseline level as there was no family present during OT assessment. ) Eating/Feeding: Minimal assistance;Sitting Eating/Feeding Details (indicate cue type and reason): Min Assist for drinking water. Had to hand pt glass Grooming: Wash/dry hands;Wash/dry face;Minimal assistance;Sitting Grooming Details (indicate cue type and reason): Increased cues for participation Upper Body Bathing: Sitting;Moderate assistance   Lower Body Bathing: +2 for physical assistance;Bed level;Maximal assistance;Total assistance Lower Body Bathing Details (indicate cue type and reason): Max-total assist at bed level as pt is unable to roll in bed. Chart review indicates that pt has hospital bed at home. No family present. Sit  to stand would require 3+ at this time Upper Body Dressing : Moderate assistance;Sitting   Lower Body Dressing: Maximal assistance;+2 for physical assistance;Cueing for safety;Bed level Lower Body Dressing Details (indicate cue type and reason): LB dressing sit to  stand would be 3+ assist at this time Toilet Transfer: +2 for physical assistance;+2 for safety/equipment;Stand-pivot;BSC;Total assistance (Simulated transfer from EOB to chair)   Toileting- Clothing Manipulation and Hygiene: Total assistance;+2 for physical assistance;Sitting/lateral lean;Sit to/from stand       Functional mobility during ADLs: +2 for physical assistance;+2 for safety/equipment;Cueing for safety;Cueing for sequencing;Total assistance General ADL Comments: Pt was initially seen for limited Evaluation by OT individually (No family present during assessment and pt not reliable historian). Pt initially agreeable to bed mobility and sitting EOB. Then became agitated with bed mobility and refused further attempts to sit EOB. She is currently Max-total assist bed mobility. Further assessment w/ PT a few minutes later to sit EOB, transfer to recliner and participate in brief ADL session. Overall, pt is +2-3 for functional mobility/transfers for safety. Pt husband would like for her to d/c home per chart review but no family present so level assist available is currently unknown.      Vision Baseline Vision/History: Wears glasses (Per pt)       Perception     Praxis      Pertinent Vitals/Pain Pain Assessment: Faces Faces Pain Scale: Hurts even more Pain Location: "Everywhere" per pt with bed mobility Pain Descriptors / Indicators: Grimacing;Guarding;Other (Comment);Sore (Pt states "Oww" with initial movement) Pain Intervention(s): Limited activity within patient's tolerance;Monitored during session;Repositioned;Other (comment) (RN made aware)     Hand Dominance Right   Extremity/Trunk Assessment Upper Extremity Assessment Upper Extremity Assessment: Generalized weakness;Difficult to assess due to impaired cognition (Bilateral UE's PROM shoulder flexion ~80-90*. Then c/o pain R grip 3/5; L grip 2+/5 Decreased FM/GM overall)   Lower Extremity Assessment Lower Extremity  Assessment: Defer to PT evaluation RLE Deficits / Details: generally weak with gross extension of 3/5 and gross flexion 2/5,  RLE Coordination: decreased fine motor;decreased gross motor LLE Deficits / Details: notably weaker than R LE with strength on MMT of 2 to 2+ generally   Cervical / Trunk Assessment Cervical / Trunk Assessment: Kyphotic (Forward head flexion, to right, kyphotic)   Communication Communication Communication: No difficulties   Cognition Arousal/Alertness: Awake/alert Behavior During Therapy: Agitated;Restless;Flat affect Overall Cognitive Status: History of cognitive impairments - at baseline                                 General Comments: PMH dementia. No family present during assessment   General Comments  pt's neck held at betw 74 and 70 * right rotation and pt will not tolerate rotation to neutral more than about 20*    Exercises Exercises:  (general bil knee/hip warm up.)   Shoulder Instructions      Home Living Family/patient expects to be discharged to:: Private residence (No family present during OT Eval, pt w/ h/o dementia and not reliable historian. Cont to assess.) Living Arrangements: Spouse/significant other Available Help at Discharge:  (No family present during OT Eval, pt w/ h/o dementia and not reliable historian. Cont to assess.)                                    Prior Functioning/Environment Level of Independence:  Needs assistance    ADL's / Homemaking Assistance Needed: Per pr an dchart review: Husband assists with ADL's and IADL's. No fmaily present during Eval. Continue to assess as MD note indicates that husband would like pt to go home and not to SNF.            OT Problem List: Decreased strength;Impaired balance (sitting and/or standing);Pain;Decreased safety awareness;Decreased knowledge of use of DME or AE;Decreased activity tolerance (Decreased cognition/dementia at baseline )      OT  Treatment/Interventions: Self-care/ADL training;Therapeutic activities;Patient/family education;Balance training    OT Goals(Current goals can be found in the care plan section) Acute Rehab OT Goals Patient Stated Goal: Unable to state OT Goal Formulation: Patient unable to participate in goal setting Time For Goal Achievement: 05/16/17 Potential to Achieve Goals: Fair  OT Frequency: Min 2X/week   Barriers to D/C: Other (comment)  Per chart review, pt husband currently declines SNF Rehab due to poor experience on another occasion. No family present at [facility] for assist with baseline level of care and pt is a poor historian. She currently requires +2 total assist.       Co-evaluation PT/OT/SLP Co-Evaluation/Treatment: Yes Reason for Co-Treatment: Necessary to address cognition/behavior during functional activity;Complexity of the patient's impairments (multi-system involvement);For patient/therapist safety PT goals addressed during session: Mobility/safety with mobility OT goals addressed during session: ADL's and self-care      AM-PAC PT "6 Clicks" Daily Activity     Outcome Measure Help from another person eating meals?: A Little Help from another person taking care of personal grooming?: A Lot Help from another person toileting, which includes using toliet, bedpan, or urinal?: Total Help from another person bathing (including washing, rinsing, drying)?: A Lot Help from another person to put on and taking off regular upper body clothing?: A Lot Help from another person to put on and taking off regular lower body clothing?: Total 6 Click Score: 11   End of Session Nurse Communication: Mobility status  Activity Tolerance: Patient limited by pain;Treatment limited secondary to agitation;Patient limited by lethargy Patient left: in chair;with call bell/phone within reach;with nursing/sitter in room  OT Visit Diagnosis: Other abnormalities of gait and mobility (R26.89);Muscle weakness  (generalized) (M62.81);Pain Pain - Right/Left:  (Pt states "Everywhere" when asked where she hurts)                Time: 8250-5397 OT Time Calculation (min): 42 min Charges:  OT General Charges $OT Visit: 1 Visit OT Evaluation $OT Eval Moderate Complexity: 1 Mod OT Treatments $Self Care/Home Management : 8-22 mins G-Codes:     Amy Barnhill, OTR/L 05/02/17 12:10 PM   Barnhill, Amy Beth [place] 05/02/2017

## 2017-05-02 NOTE — Progress Notes (Signed)
PROGRESS NOTE  Tami [name]  PPI:[id number] DOB: 1941-07-13 DOA: 04/27/2017 PCP: Sinda Du, MD   Brief Narrative: Tami [name] is a 76 y.o. female with a history of dementia and atrial fibrillation who presented on 9/1 with weakness, urinary complaints, and intermittent loose stools. She was hypotensive despite 4L NS, requiring phenylephrine, and UA demonstrated pyuria. AFib with RVR was noted for which rate-control medications were given and she was admitted to the ICU for sepsis due to UTI. Broad spectrum antibiotics were started and blood cultures drawn. Urine culture was noncloncal and blood cultures have grown GPC in 1 of 2, CoNS on rapid ID. Pressors were weaned off 9/2 and the patient was transferred to the SDU on diltiazem gtt for ongoing RVR. BP has improved, oral medications restarted and diltiazem infusion was titrated off 9/4. Diarrhea has decreased and electrolyte derangements have improved with replacement. She had volume overload with hypoxia which is improving with diuresis.   Assessment & Plan: Active Problems:   Atrial fibrillation with RVR (HCC)   Dementia   Sepsis ([place])   Septic shock (HCC)   UTI (urinary tract infection)   AKI (acute kidney injury) ([place])   Diarrhea   Transient alteration of awareness  Septic shock due to UTI source, though urine culture is nonclonal. Shock has resolved, pressors weaned 9/2. Lactic acid normalized.  - Received vancomycin, aztreonam and levaquin 9/1, meropenem 9/1 - 9/2, ceftriaxone 9/2 >>  - BPs improved.  - Monitor culture data.  - DC right IJ CVC (placed 9/1)   Acute hypoxic respiratory failure: Due to sepsis, complicated by pulmonary vascular congestion. Echo with preserved EF, no comment on diastolic function due to AFib, no WMA's. Dilated IVC, normal RV structure/function.  - Continue supplemental oxygen as needed to maintain SpO2 >90%. Hopeful to wean to room air today. - Will repeat lasix 20mg  IV again today, creatinine  continues to improve and respiratory status also improving.    1 of 2 blood cultures + CoNS:  - Monitor cultures. Suspect contaminant  AFib with RVR: Preserved EF on echo 9/4 - Continue metoprolol at home dose equivalent (100mg  BID instead of 50mg  q6h), digoxin and diltiazem po. - Optimizing electrolytes, Ca, Mg, K.   Hypokalemia: Improving with decreasing GI losses.  - Continue replacement with K and Mg. Goal is K >4 and Mg >2.  Acute renal failure: Resolved, due to sepsis.  - Monitor BMP and UOP  Diarrhea: C. diff negative.  - Monitor, improving.  Demand ischemia: Troponins trending downward. - Continue aspirin, beta blocker.   Hyperlipidemia:  - Restart statin  Dementia: Chronic, stable. Suspect acute metabolic encephalopathy as well.  - Restart aricept, doxepin - Delirium precautions   DVT prophylaxis: Subcutaneous heparin Code Status: Full Family Communication: None at bedside Disposition Plan: Therapy evaluations, has hospital bed at home. Husband would prefer pt to DC home. Possible DC in 24 hrs.  Consultants:   PCCM primary 9/1 - 9/2  Procedures:   Right IJ CVC 9/1 - 9/6  Antimicrobials: Vancomycin, aztreonam and levaquin 9/1 Meropenem 9/1 - 9/2 Ceftriaxone 9/2 >>  Subjective: No complaints, specifically denies pain or trouble breathing.   Objective: Vitals:   05/02/17 0500 05/02/17 0502 05/02/17 1114 05/02/17 1501  BP:  134/84 (!) 147/73 (!) 159/82  Pulse:  82 64 100  Resp:  18    Temp:  98 F (36.7 C)  97.8 F (36.6 C)  TempSrc:  Oral  Oral  SpO2:  93%  91%  Weight: 59.8 kg (131 lb 13.4 oz)     Height:        Intake/Output Summary (Last 24 hours) at 05/02/17 1557 Last data filed at 05/02/17 1123  Gross per 24 hour  Intake              165 ml  Output             1650 ml  Net            -1485 ml   Filed Weights   04/30/17 0300 05/01/17 0307 05/02/17 0500  Weight: 76.9 kg (169 lb 8.5 oz) 77.5 kg (170 lb 13.7 oz) 59.8 kg (131 lb 13.4 oz)      Gen: 76 y.o. female in no distress, sleeping quietly  Pulm: Non-labored breathing 2L by . Scant crackles, improved.  CV: Irregular, narrow complexes on monitor with rate 90-100. No murmur, rub, or gallop. No JVD, trace pedal edema. GI: Abdomen soft, non-tender, non-distended, with normoactive bowel sounds. No organomegaly or masses felt. Ext: Warm, no deformities Skin: No rashes, lesions no ulcers Neuro: Not oriented. Very weak though no specific focal neurological deficits. Psych: Judgement and insight appear impaired. Mood & affect appropriate.   CBC:  Recent Labs Lab 04/27/17 0108 04/27/17 0804 04/28/17 1230 04/29/17 0443 04/30/17 0530  WBC 22.2* 21.3* 11.3* 10.6* 7.5  NEUTROABS 18.9* 18.2*  --  8.8*  --   HGB 13.5 11.6* 11.0* 11.2* 11.4*  HCT 42.1 36.6 35.2* 35.7* 36.0  MCV 85.4 84.1 85.0 83.6 84.1  PLT 272 285 231 249 756   Basic Metabolic Panel:  Recent Labs Lab 04/28/17 1230 04/29/17 0443 04/30/17 0530 05/01/17 0310 05/02/17 0858  NA 141 143 143 144 144  K 3.5 3.2* 2.9* 3.6 3.3*  CL 112* 113* 108 104 99*  CO2 21* 22 29 31  33*  GLUCOSE 74 66 98 94 100*  BUN 26* 20 10 5* 7  CREATININE 1.56* 1.22* 0.82 0.80 0.77  CALCIUM 7.2* 7.7* 8.0* 8.1* 8.4*  MG  --  1.5* 1.5* 1.7  --    GFR: Estimated Creatinine Clearance: 45.8 mL/min (by C-G formula based on SCr of 0.77 mg/dL). Liver Function Tests:  Recent Labs Lab 04/27/17 0108 04/27/17 0804  AST 67* 53*  ALT 27 21  ALKPHOS 102 78  BILITOT 1.0 0.6  PROT 5.8* 4.4*  ALBUMIN 2.8* 2.0*   Cardiac Enzymes:  Recent Labs Lab 04/27/17 0108 04/27/17 0804 04/27/17 1427 04/27/17 2250 04/29/17 0443  TROPONINI 2.55* 1.20* 0.96* 0.85* 0.38*   CBG:  Recent Labs Lab 04/27/17 2053 04/28/17 0014 04/28/17 0414 04/28/17 0803 04/28/17 1134  GLUCAP 99 76 81 89 75   Urine analysis:    Component Value Date/Time   COLORURINE YELLOW 04/27/2017 0229   APPEARANCEUR TURBID (A) 04/27/2017 0229   LABSPEC 1.015  04/27/2017 0229   PHURINE 6.0 04/27/2017 0229   GLUCOSEU NEGATIVE 04/27/2017 0229   HGBUR MODERATE (A) 04/27/2017 0229   HGBUR negative 04/21/2008 1015   BILIRUBINUR NEGATIVE 04/27/2017 0229   KETONESUR NEGATIVE 04/27/2017 0229   PROTEINUR 100 (A) 04/27/2017 0229   UROBILINOGEN 0.2 10/01/2014 0040   NITRITE NEGATIVE 04/27/2017 0229   LEUKOCYTESUR SMALL (A) 04/27/2017 0229   Recent Results (from the past 240 hour(s))  Blood Culture (routine x 2)     Status: None   Collection Time: 04/27/17  1:09 AM  Result Value Ref Range Status   Specimen Description LEFT ANTECUBITAL  Final   Special Requests   Final  BOTTLES DRAWN AEROBIC AND ANAEROBIC Blood Culture adequate volume   Culture NO GROWTH 5 DAYS  Final   Report Status 05/02/2017 FINAL  Final  Blood Culture (routine x 2)     Status: Abnormal   Collection Time: 04/27/17  1:39 AM  Result Value Ref Range Status   Specimen Description BLOOD LEFT HAND  Final   Special Requests   Final    BOTTLES DRAWN AEROBIC ONLY Blood Culture results may not be optimal due to an inadequate volume of blood received in culture bottles   Culture  Setup Time   Final    GRAM POSITIVE COCCI AEB BOTTLE Gram Stain Report Called to,Read Back By and Verified With: ANN TUTTLE , RN AT [facility] ED @ 1105 ON 9.2.18 BY BOWMAN,L DAMETRIA PARKS, RN AT [facility] @ 1125 ON 9.2.18 BY BOWMAN,L CRITICAL RESULT CALLED TO, READ BACK BY AND VERIFIED WITH: A. MAYER, RPHARMD AT 1610 ON 04/28/17 BY C. JESSUP, MLT.    Culture (A)  Final    STAPHYLOCOCCUS SPECIES (COAGULASE NEGATIVE) THE SIGNIFICANCE OF ISOLATING THIS ORGANISM FROM A SINGLE SET OF BLOOD CULTURES WHEN MULTIPLE SETS ARE DRAWN IS UNCERTAIN. PLEASE NOTIFY THE MICROBIOLOGY DEPARTMENT WITHIN ONE WEEK IF SPECIATION AND SENSITIVITIES ARE REQUIRED. Performed at [facility], [place] [address], [place], [place] [address]    Report Status 04/29/2017 FINAL  Final  Blood Culture ID Panel (Reflexed)     Status: Abnormal   Collection  Time: 04/27/17  1:39 AM  Result Value Ref Range Status   Enterococcus species NOT DETECTED NOT DETECTED Final   Listeria monocytogenes NOT DETECTED NOT DETECTED Final   Staphylococcus species DETECTED (A) NOT DETECTED Final    Comment: Methicillin (oxacillin) resistant coagulase negative staphylococcus. Possible blood culture contaminant (unless isolated from more than one blood culture draw or clinical case suggests pathogenicity). No antibiotic treatment is indicated for blood  culture contaminants. CRITICAL RESULT CALLED TO, READ BACK BY AND VERIFIED WITH: A. MAYER, RPHARMD AT 1610 ON 04/28/17 BY C. JESSUP, MLT.    Staphylococcus aureus NOT DETECTED NOT DETECTED Final   Methicillin resistance DETECTED (A) NOT DETECTED Final    Comment: CRITICAL RESULT CALLED TO, READ BACK BY AND VERIFIED WITH: A. MAYER, RPHARMD AT 1610 ON 04/28/17 BY C. JESSUP, MLT.    Streptococcus species NOT DETECTED NOT DETECTED Final   Streptococcus agalactiae NOT DETECTED NOT DETECTED Final   Streptococcus pneumoniae NOT DETECTED NOT DETECTED Final   Streptococcus pyogenes NOT DETECTED NOT DETECTED Final   Acinetobacter baumannii NOT DETECTED NOT DETECTED Final   Enterobacteriaceae species NOT DETECTED NOT DETECTED Final   Enterobacter cloacae complex NOT DETECTED NOT DETECTED Final   Escherichia coli NOT DETECTED NOT DETECTED Final   Klebsiella oxytoca NOT DETECTED NOT DETECTED Final   Klebsiella pneumoniae NOT DETECTED NOT DETECTED Final   Proteus species NOT DETECTED NOT DETECTED Final   Serratia marcescens NOT DETECTED NOT DETECTED Final   Haemophilus influenzae NOT DETECTED NOT DETECTED Final   Neisseria meningitidis NOT DETECTED NOT DETECTED Final   Pseudomonas aeruginosa NOT DETECTED NOT DETECTED Final   Candida albicans NOT DETECTED NOT DETECTED Final   Candida glabrata NOT DETECTED NOT DETECTED Final   Candida krusei NOT DETECTED NOT DETECTED Final   Candida parapsilosis NOT DETECTED NOT DETECTED Final     Candida tropicalis NOT DETECTED NOT DETECTED Final    Comment: Performed at [facility], [place] [address], [place], [place] [address]  Urine culture  Status: Abnormal   Collection Time: 04/27/17  2:29 AM  Result Value Ref Range Status   Specimen Description URINE, CATHETERIZED  Final   Special Requests NONE  Final   Culture MULTIPLE SPECIES PRESENT, SUGGEST RECOLLECTION (A)  Final   Report Status 04/28/2017 FINAL  Final  MRSA PCR Screening     Status: None   Collection Time: 04/27/17  5:54 AM  Result Value Ref Range Status   MRSA by PCR NEGATIVE NEGATIVE Final    Comment:        The GeneXpert MRSA Assay (FDA approved for NASAL specimens only), is one component of a comprehensive MRSA colonization surveillance program. It is not intended to diagnose MRSA infection nor to guide or monitor treatment for MRSA infections.   C difficile quick scan w PCR reflex     Status: None   Collection Time: 04/27/17  2:18 PM  Result Value Ref Range Status   C Diff antigen NEGATIVE NEGATIVE Final   C Diff toxin NEGATIVE NEGATIVE Final   C Diff interpretation No C. difficile detected.  Final      Radiology Studies: No results found.  Scheduled Meds: . aspirin  81 mg Oral Daily  . digoxin  0.25 mg Oral Daily  . diltiazem  120 mg Oral Daily  . donepezil  20 mg Oral QHS  . doxepin  150 mg Oral QHS  . feeding supplement  1 Container Oral BID BM  . furosemide  20 mg Intravenous Once  . heparin  5,000 Units Subcutaneous Q8H  . metoprolol tartrate  100 mg Oral BID  . pravastatin  40 mg Oral QPM   Continuous Infusions: . sodium chloride    . cefTRIAXone (ROCEPHIN)  IV 1 g (05/02/17 1455)     LOS: 5 days   Time spent: 25 minutes.  Vance Gather, MD [facility] Pager [phone]  If 7PM-7AM, please contact night-coverage [email or web address] Password [email or web address] 05/02/2017, 3:57 PM

## 2017-05-03 LAB — BASIC METABOLIC PANEL
Anion gap: 12 (ref 5–15)
BUN: 9 mg/dL (ref 6–20)
CHLORIDE: 98 mmol/L — AB (ref 101–111)
CO2: 34 mmol/L — ABNORMAL HIGH (ref 22–32)
CREATININE: 0.72 mg/dL (ref 0.44–1.00)
Calcium: 8.5 mg/dL — ABNORMAL LOW (ref 8.9–10.3)
GFR calc Af Amer: >60 mL/min (ref >60)
GFR calc non Af Amer: >60 mL/min (ref >60)
GLUCOSE: 107 mg/dL — AB (ref 65–99)
POTASSIUM: 3.2 mmol/L — AB (ref 3.5–5.1)
SODIUM: 144 mmol/L (ref 135–145)

## 2017-05-03 LAB — MAGNESIUM: Magnesium: 1.6 mg/dL — ABNORMAL LOW (ref 1.7–2.4)

## 2017-05-03 MED ORDER — POTASSIUM CHLORIDE CRYS ER 20 MEQ PO TBCR
40.0000 meq | EXTENDED_RELEASE_TABLET | Freq: Once | ORAL | Status: AC
  Administered 2017-05-03: 40 meq via ORAL
  Filled 2017-05-03: qty 2

## 2017-05-03 MED ORDER — POTASSIUM CHLORIDE 10 MEQ/100ML IV SOLN
10.0000 meq | INTRAVENOUS | Status: AC
  Administered 2017-05-03 (×5): 10 meq via INTRAVENOUS
  Filled 2017-05-03 (×5): qty 100

## 2017-05-03 MED ORDER — POTASSIUM CHLORIDE CRYS ER 20 MEQ PO TBCR
40.0000 meq | EXTENDED_RELEASE_TABLET | Freq: Two times a day (BID) | ORAL | Status: DC
  Administered 2017-05-03: 40 meq via ORAL
  Filled 2017-05-03: qty 2

## 2017-05-03 MED ORDER — FUROSEMIDE 10 MG/ML IJ SOLN
40.0000 mg | Freq: Every day | INTRAMUSCULAR | Status: DC
  Administered 2017-05-03 – 2017-05-04 (×2): 40 mg via INTRAVENOUS
  Filled 2017-05-03 (×2): qty 4

## 2017-05-03 MED ORDER — MAGNESIUM SULFATE 2 GM/50ML IV SOLN
2.0000 g | Freq: Once | INTRAVENOUS | Status: AC
  Administered 2017-05-03: 2 g via INTRAVENOUS
  Filled 2017-05-03: qty 50

## 2017-05-03 MED ORDER — FUROSEMIDE 10 MG/ML IJ SOLN: 20.0000 mg | Freq: Every day | INTRAMUSCULAR | Status: DC

## 2017-05-03 MED ORDER — POTASSIUM CHLORIDE 10 MEQ/100ML IV SOLN
10.0000 meq | INTRAVENOUS | Status: DC
  Administered 2017-05-03: 10 meq via INTRAVENOUS
  Filled 2017-05-03: qty 100

## 2017-05-03 NOTE — Consult Note (Signed)
[facility] CM Primary Care Navigator  05/03/2017  [name] February 28, 1941 [id number]   Met with patient at the bedside but she was unable to answer questions accurately. Patient was noted to have memory issues related to dementia. Called and spoke with husband [name]) to identify possible discharge needs.  Husband reports that patient had diarrhea and weakness that led to this admission. Patient's husband endorses Dr. Sinda Du with Sinda Du MD [facility] her primary care provider.   Husband shared using [facility] in [place] obtain medications without difficulty.   Husband manages her medications at home straight out of the containers using his own "organizing system".   Patient's husband reportsproviding transportation for patient but states needing some help with transport from their home to her doctors'appointments.   [facility] list of transportation resources for back-up use provided at the bedside and he was grateful for it.   Husband states that he is the caregiverfor patient at home.  Patient's husband states he would prefer for patient to go home than to a SNF (skilled nursing facility) as recommended by therapy since he was not satisfied with the care she received at the skilled nursing facility in the past.  He expressedunderstanding to call primary care provider's officewhen patient returns back home,for a post discharge follow-up appointment within a week or sooner if needed.Patient letter (with PCP's contact number) was provided at the bedside as areminder.  Explained to husband about [facility] CM services available for healthmanagement but he verbalized that he has "been managing it all at home" and has no further issues at this time. He is aware to requestreferral to [facility] care managementservicesfrom primary care provider if deemed necessary in the future.   [facility] care management contact information provided for future needs that  may arise.   For questions, please contact:  Dannielle Huh, BSN, RN- [facility] Primary Care Navigator  Telephone: [phone] [place]

## 2017-05-03 NOTE — Progress Notes (Signed)
Pt is very irritable, took all her med except for the potassium. Potasium was disolved in water due to patient unable to swallow pills but she refused and threw it away. Will discuss with MD if she can have IV potassium

## 2017-05-03 NOTE — Progress Notes (Signed)
PROGRESS NOTE  Tami [name]  IDP:[id number] DOB: 1941/05/03 DOA: 04/27/2017 PCP: Sinda Du, MD   Brief Narrative: Tami [name] is a 76 y.o. female with a history of dementia and atrial fibrillation who presented on 9/1 with weakness, urinary complaints, and intermittent loose stools. She was hypotensive despite 4L NS, requiring phenylephrine, and UA demonstrated pyuria. AFib with RVR was noted for which rate-control medications were given and she was admitted to the ICU for sepsis due to UTI. Broad spectrum antibiotics were started and blood cultures drawn. Urine culture was noncloncal and blood cultures have grown GPC in 1 of 2, CoNS on rapid ID. Pressors were weaned off 9/2 and the patient was transferred to the SDU on diltiazem gtt for ongoing RVR. BP has improved, oral medications restarted and diltiazem infusion was titrated off 9/4. Diarrhea has decreased and electrolyte derangements have improved with replacement. She had volume overload with hypoxia which is improving with diuresis.   Assessment & Plan: Active Problems:   Atrial fibrillation with RVR (HCC)   Dementia   Sepsis ([place])   Septic shock (HCC)   UTI (urinary tract infection)   AKI (acute kidney injury) ([place])   Diarrhea   Transient alteration of awareness  Septic shock due to UTI source, though urine culture is nonclonal. Shock has resolved, pressors weaned 9/2. Lactic acid normalized.  - Received vancomycin, aztreonam and levaquin 9/1, meropenem 9/1 - 9/2, ceftriaxone 9/2 - 9/8.  Acute hypoxic respiratory failure: Due to sepsis, complicated by pulmonary vascular congestion. Echo with preserved EF, no comment on diastolic function due to AFib, no WMA's. Dilated IVC, normal RV structure/function. Received high volume resuscitation early in course. - Continue supplemental oxygen as needed to maintain SpO2 >90%. Hopeful to wean to room air today. - Will give lasix 40mg  IV today. Still overall +4.6L. Creatinine continues to  improve and respiratory status also improving.    1 of 2 blood cultures + CoNS:  - Monitor cultures. Suspect contaminant  AFib with RVR: Preserved EF on echo 9/4 - Continue metoprolol at home dose equivalent (100mg  BID instead of 50mg  q6h), digoxin and diltiazem po. - Optimizing electrolytes, Ca, Mg, K.   Hypokalemia: Improving with decreasing GI losses, now caused by loop diuretic.  - Continue replacement with K and Mg. Goal is K >4 and Mg >2.  Acute renal failure: Resolved, due to sepsis.  - Monitor BMP and UOP  Diarrhea: C. diff negative.  - Monitor, improving.  Demand ischemia: Troponins trending downward. - Continue aspirin, beta blocker.   Hyperlipidemia:  - Restart statin  Dementia: Chronic, stable. Suspect acute metabolic encephalopathy as well.  - Restart aricept, doxepin - Delirium precautions   DVT prophylaxis: Subcutaneous heparin Code Status: Full Family Communication: None at bedside Disposition Plan: Therapy evaluations, has hospital bed at home. Husband would prefer pt to DC home, though recommendation is for SNF. CSW to discuss with family. Anticipate DC in next 24-48hrs (requiring diuresis following volume resuscitation for septic shock).  Consultants:   PCCM primary 9/1 - 9/2  Procedures:   Right IJ CVC 9/1 - 9/6  Antimicrobials: Vancomycin, aztreonam and levaquin 9/1 Meropenem 9/1 - 9/2 Ceftriaxone 9/2 >>  Subjective: Feels hot this morning (not febrile), improved with taking all covers off. No diarrhea or dysuria. Breathing is improved but still required supplemental oxygen.  Objective: Vitals:   05/02/17 1501 05/02/17 2119 05/03/17 0500 05/03/17 0917  BP: (!) 159/82 (!) 174/84 (!) 156/80 (!) 157/104  Pulse: 100 (!) 115 85  93  Resp:  18 16   Temp: 97.8 F (36.6 C) 97.8 F (36.6 C) 98.6 F (37 C)   TempSrc: Oral Oral Oral   SpO2: 91% 94% 96%   Weight:      Height:        Intake/Output Summary (Last 24 hours) at 05/03/17 1416 Last  data filed at 05/03/17 0946  Gross per 24 hour  Intake              170 ml  Output                0 ml  Net              170 ml   Filed Weights   04/30/17 0300 05/01/17 0307 05/02/17 0500  Weight: 76.9 kg (169 lb 8.5 oz) 77.5 kg (170 lb 13.7 oz) 59.8 kg (131 lb 13.4 oz)    Gen: 76 y.o. female in no distress, irritable this morning, says she feels hot. Pulm: Non-labored breathing 2L by . Did not allow posterior auscultation. No wheezes anteriorly. CV: Irreg irreg, rate in 80's. No murmur, rub, or gallop. No JVD, trace pedal edema. GI: Abdomen soft, non-tender, non-distended, with normoactive bowel sounds. No organomegaly or masses felt. Ext: Warm, no deformities Skin: No rashes, lesions no ulcers Neuro: Not oriented. Very weak though no specific focal neurological deficits. Psych: Judgement and insight appear impaired. Mood irritable & affect not inappropriate.   CBC:  Recent Labs Lab 04/27/17 0108 04/27/17 0804 04/28/17 1230 04/29/17 0443 04/30/17 0530  WBC 22.2* 21.3* 11.3* 10.6* 7.5  NEUTROABS 18.9* 18.2*  --  8.8*  --   HGB 13.5 11.6* 11.0* 11.2* 11.4*  HCT 42.1 36.6 35.2* 35.7* 36.0  MCV 85.4 84.1 85.0 83.6 84.1  PLT 272 285 231 249 213   Basic Metabolic Panel:  Recent Labs Lab 04/29/17 0443 04/30/17 0530 05/01/17 0310 05/02/17 0858 05/03/17 0522  NA 143 143 144 144 144  K 3.2* 2.9* 3.6 3.3* 3.2*  CL 113* 108 104 99* 98*  CO2 22 29 31  33* 34*  GLUCOSE 66 98 94 100* 107*  BUN 20 10 5* 7 9  CREATININE 1.22* 0.82 0.80 0.77 0.72  CALCIUM 7.7* 8.0* 8.1* 8.4* 8.5*  MG 1.5* 1.5* 1.7  --  1.6*   GFR: Estimated Creatinine Clearance: 45.8 mL/min (by C-G formula based on SCr of 0.72 mg/dL). Liver Function Tests:  Recent Labs Lab 04/27/17 0108 04/27/17 0804  AST 67* 53*  ALT 27 21  ALKPHOS 102 78  BILITOT 1.0 0.6  PROT 5.8* 4.4*  ALBUMIN 2.8* 2.0*   Cardiac Enzymes:  Recent Labs Lab 04/27/17 0108 04/27/17 0804 04/27/17 1427 04/27/17 2250  04/29/17 0443  TROPONINI 2.55* 1.20* 0.96* 0.85* 0.38*   CBG:  Recent Labs Lab 04/27/17 2053 04/28/17 0014 04/28/17 0414 04/28/17 0803 04/28/17 1134  GLUCAP 99 76 81 89 75   Urine analysis:    Component Value Date/Time   COLORURINE YELLOW 04/27/2017 0229   APPEARANCEUR TURBID (A) 04/27/2017 0229   LABSPEC 1.015 04/27/2017 0229   PHURINE 6.0 04/27/2017 0229   GLUCOSEU NEGATIVE 04/27/2017 0229   HGBUR MODERATE (A) 04/27/2017 0229   HGBUR negative 04/21/2008 1015   BILIRUBINUR NEGATIVE 04/27/2017 0229   KETONESUR NEGATIVE 04/27/2017 0229   PROTEINUR 100 (A) 04/27/2017 0229   UROBILINOGEN 0.2 10/01/2014 0040   NITRITE NEGATIVE 04/27/2017 0229   LEUKOCYTESUR SMALL (A) 04/27/2017 0229   Recent Results (from the past 240 hour(s))  Blood  Culture (routine x 2)     Status: None   Collection Time: 04/27/17  1:09 AM  Result Value Ref Range Status   Specimen Description LEFT ANTECUBITAL  Final   Special Requests   Final    BOTTLES DRAWN AEROBIC AND ANAEROBIC Blood Culture adequate volume   Culture NO GROWTH 5 DAYS  Final   Report Status 05/02/2017 FINAL  Final  Blood Culture (routine x 2)     Status: Abnormal   Collection Time: 04/27/17  1:39 AM  Result Value Ref Range Status   Specimen Description BLOOD LEFT HAND  Final   Special Requests   Final    BOTTLES DRAWN AEROBIC ONLY Blood Culture results may not be optimal due to an inadequate volume of blood received in culture bottles   Culture  Setup Time   Final    GRAM POSITIVE COCCI AEB BOTTLE Gram Stain Report Called to,Read Back By and Verified With: ANN TUTTLE , RN AT [facility] ED @ 1105 ON 9.2.18 BY BOWMAN,L DAMETRIA PARKS, RN AT [facility] @ 1125 ON 9.2.18 BY BOWMAN,L CRITICAL RESULT CALLED TO, READ BACK BY AND VERIFIED WITH: A. MAYER, RPHARMD AT 1610 ON 04/28/17 BY C. JESSUP, MLT.    Culture (A)  Final    STAPHYLOCOCCUS SPECIES (COAGULASE NEGATIVE) THE SIGNIFICANCE OF ISOLATING THIS ORGANISM FROM A SINGLE SET OF BLOOD CULTURES WHEN  MULTIPLE SETS ARE DRAWN IS UNCERTAIN. PLEASE NOTIFY THE MICROBIOLOGY DEPARTMENT WITHIN ONE WEEK IF SPECIATION AND SENSITIVITIES ARE REQUIRED. Performed at [facility], [place] [address], [place], [place] [address]    Report Status 04/29/2017 FINAL  Final  Blood Culture ID Panel (Reflexed)     Status: Abnormal   Collection Time: 04/27/17  1:39 AM  Result Value Ref Range Status   Enterococcus species NOT DETECTED NOT DETECTED Final   Listeria monocytogenes NOT DETECTED NOT DETECTED Final   Staphylococcus species DETECTED (A) NOT DETECTED Final    Comment: Methicillin (oxacillin) resistant coagulase negative staphylococcus. Possible blood culture contaminant (unless isolated from more than one blood culture draw or clinical case suggests pathogenicity). No antibiotic treatment is indicated for blood  culture contaminants. CRITICAL RESULT CALLED TO, READ BACK BY AND VERIFIED WITH: A. MAYER, RPHARMD AT 1610 ON 04/28/17 BY C. JESSUP, MLT.    Staphylococcus aureus NOT DETECTED NOT DETECTED Final   Methicillin resistance DETECTED (A) NOT DETECTED Final    Comment: CRITICAL RESULT CALLED TO, READ BACK BY AND VERIFIED WITH: A. MAYER, RPHARMD AT 1610 ON 04/28/17 BY C. JESSUP, MLT.    Streptococcus species NOT DETECTED NOT DETECTED Final   Streptococcus agalactiae NOT DETECTED NOT DETECTED Final   Streptococcus pneumoniae NOT DETECTED NOT DETECTED Final   Streptococcus pyogenes NOT DETECTED NOT DETECTED Final   Acinetobacter baumannii NOT DETECTED NOT DETECTED Final   Enterobacteriaceae species NOT DETECTED NOT DETECTED Final   Enterobacter cloacae complex NOT DETECTED NOT DETECTED Final   Escherichia coli NOT DETECTED NOT DETECTED Final   Klebsiella oxytoca NOT DETECTED NOT DETECTED Final   Klebsiella pneumoniae NOT DETECTED NOT DETECTED Final   Proteus species NOT DETECTED NOT DETECTED Final   Serratia marcescens NOT DETECTED NOT DETECTED Final   Haemophilus influenzae NOT DETECTED NOT DETECTED  Final   Neisseria meningitidis NOT DETECTED NOT DETECTED Final   Pseudomonas aeruginosa NOT DETECTED NOT DETECTED Final   Candida albicans NOT DETECTED NOT DETECTED Final   Candida glabrata NOT DETECTED NOT DETECTED Final   Candida krusei NOT DETECTED NOT DETECTED Final  Candida parapsilosis NOT DETECTED NOT DETECTED Final   Candida tropicalis NOT DETECTED NOT DETECTED Final    Comment: Performed at [facility], [place] [address] [place], [place] [address]  Urine culture     Status: Abnormal   Collection Time: 04/27/17  2:29 AM  Result Value Ref Range Status   Specimen Description URINE, CATHETERIZED  Final   Special Requests NONE  Final   Culture MULTIPLE SPECIES PRESENT, SUGGEST RECOLLECTION (A)  Final   Report Status 04/28/2017 FINAL  Final  MRSA PCR Screening     Status: None   Collection Time: 04/27/17  5:54 AM  Result Value Ref Range Status   MRSA by PCR NEGATIVE NEGATIVE Final    Comment:        The GeneXpert MRSA Assay (FDA approved for NASAL specimens only), is one component of a comprehensive MRSA colonization surveillance program. It is not intended to diagnose MRSA infection nor to guide or monitor treatment for MRSA infections.   C difficile quick scan w PCR reflex     Status: None   Collection Time: 04/27/17  2:18 PM  Result Value Ref Range Status   C Diff antigen NEGATIVE NEGATIVE Final   C Diff toxin NEGATIVE NEGATIVE Final   C Diff interpretation No C. difficile detected.  Final      Radiology Studies: No results found.  Scheduled Meds: . aspirin  81 mg Oral Daily  . digoxin  0.25 mg Oral Daily  . diltiazem  120 mg Oral Daily  . donepezil  20 mg Oral QHS  . doxepin  150 mg Oral QHS  . feeding supplement  1 Container Oral BID BM  . furosemide  40 mg Intravenous Daily  . heparin  5,000 Units Subcutaneous Q8H  . metoprolol tartrate  100 mg Oral BID  . pravastatin  40 mg Oral QPM   Continuous Infusions: . sodium chloride    . cefTRIAXone  (ROCEPHIN)  IV 1 g (05/03/17 1349)  . potassium chloride 10 mEq (05/03/17 1341)     LOS: 6 days   Time spent: 25 minutes.  Vance Gather, MD [facility] Pager [phone]  If 7PM-7AM, please contact night-coverage [email or web address] Password [email or web address] 05/03/2017, 2:16 PM

## 2017-05-03 NOTE — Progress Notes (Signed)
CSW attempted to complete assessment with patient's husband to discuss SNF; per notes, patient and husband would like to return home. CSW unable to leave a voicemail, as voicemail is not set up.   CSW to follow up to complete assessment tomorrow.  Laveda Abbe, [place] Clinical Social Worker [phone]

## 2017-05-04 MED ORDER — DILTIAZEM HCL ER COATED BEADS 240 MG PO CP24
240.0000 mg | ORAL_CAPSULE | Freq: Every day | ORAL | Status: DC
  Administered 2017-05-04: 240 mg via ORAL
  Filled 2017-05-04: qty 1

## 2017-05-04 MED ORDER — BOOST / RESOURCE BREEZE PO LIQD: 1.0000 | Freq: Two times a day (BID) | ORAL | 0 refills | Status: AC

## 2017-05-04 MED ORDER — DILTIAZEM HCL ER COATED BEADS 240 MG PO CP24: 240.0000 mg | ORAL_CAPSULE | Freq: Every day | ORAL | Status: DC

## 2017-05-04 NOTE — Clinical Social Work Note (Signed)
CSW spoke to pt's spouse who reported he does not want pt to go to SNF, wants pt home. Transportation to be set up by [facility] for home as well as [place].  Pt has no other identified DC needs.   Tavion Senkbeil B. Joline Maxcy Clinical Social Work Dept Weekend Social Worker [phone] 3:35 PM

## 2017-05-04 NOTE — Progress Notes (Signed)
Occupational Therapy Treatment Patient Details Name: Tami [name] MRN: [id number] DOB: Feb 22, 1941 Today's Date: 05/04/2017    History of present illness 76 yr old lady with dementia, atrial fibrillation, coming in with weakness and as per notes her husband noticed that she has been having some lose stools. Upon arrival to the outside hospital ED she was severely hypotensive with SBP 40s. Patient did receive 4 litres of fluids and started on phenylephrine. Patient was also found in atrial fibrillation with RVR and was given metoprolol which worsened her BP. Patient was found to have a UTI.  Pt admitted w/ dx: Sepsis   OT comments  Pt. Very lethargic this day.  Max stimulus to sustain attention to grooming tasks.  Able to comb hair, wash, face and apply lotion with verbal and tactile guidance.  C/o pain in L shoulder and neck rotation to the R.  Will continue to follow acutely.    Follow Up Recommendations  SNF;Supervision/Assistance - 24 hour;Other (comment)    Equipment Recommendations       Recommendations for Other Services      Precautions / Restrictions Precautions Precautions: Fall Restrictions Weight Bearing Restrictions: No       Mobility Bed Mobility                  Transfers                      Balance                                           ADL either performed or assessed with clinical judgement   ADL       Grooming: Wash/dry hands;Wash/dry face;Bed level;Minimal assistance;Cueing for sequencing;Brushing hair Grooming Details (indicate cue type and reason): Increased cues for participation                               General ADL Comments: pt. very lethargic requiring max cues and stimuli for engaging in tasks.  continued to say "okay, okay" when asked to do something but would not initiate.  once awake did well with familiar tasks, but still required cues for task completion     Vision       Perception      Praxis      Cognition Arousal/Alertness: Lethargic Behavior During Therapy: Agitated;Restless;Flat affect Overall Cognitive Status: History of cognitive impairments - at baseline                                 General Comments: PMH dementia. No family present during assessment        Exercises     Shoulder Instructions       General Comments      Pertinent Vitals/ Pain       Pain Assessment: Faces Pain Location: grimacing and verbal with any L shoulder or arm repositioning and head and neck movement out of current position.   Pain Descriptors / Indicators: Grimacing;Guarding;Other (Comment);Sore Pain Intervention(s): Limited activity within patient's tolerance;Repositioned  Home Living  Prior Functioning/Environment              Frequency  Min 2X/week        Progress Toward Goals  OT Goals(current goals can now be found in the care plan section)  Progress towards OT goals: Progressing toward goals     Plan Discharge plan remains appropriate    Co-evaluation                 AM-PAC PT "6 Clicks" Daily Activity     Outcome Measure   Help from another person eating meals?: A Little Help from another person taking care of personal grooming?: A Lot Help from another person toileting, which includes using toliet, bedpan, or urinal?: Total Help from another person bathing (including washing, rinsing, drying)?: A Lot Help from another person to put on and taking off regular upper body clothing?: A Lot Help from another person to put on and taking off regular lower body clothing?: Total 6 Click Score: 11    End of Session    OT Visit Diagnosis: Other abnormalities of gait and mobility (R26.89);Muscle weakness (generalized) (M62.81);Pain   Activity Tolerance Patient limited by lethargy   Patient Left in bed;with call bell/phone within reach;with bed alarm set   Nurse  Communication          Time: 0093-8182 OT Time Calculation (min): 10 min  Charges: OT General Charges $OT Visit: 1 Visit OT Treatments $Self Care/Home Management : 8-22 mins   Janice Coffin, COTA/L 05/04/2017, 8:46 AM

## 2017-05-04 NOTE — Care Management Note (Signed)
Case Management Note  Patient Details  Name: Tami [name] MRN: [id number] Date of Birth: [birth year]  Subjective/Objective:                 Spoke w husband's spouse. They would like to use [facility], referral made. Per spouse will need transport to home via [name]. Forms printed to [facility]. RN to call [name] at [phone] to notify of pick up when patient ready for DC. Patient's spouse will be at home to receive her. No other CM needs   Action/Plan:   Expected Discharge Date:  05/04/17               Expected Discharge Plan:  [place]  In-House Referral:     Discharge planning Services  CM Consult  Post Acute Care Choice:  Home Health Choice offered to:  Spouse  DME Arranged:    DME Agency:     HH Arranged:  RN, PT, OT, Nurse's Aide, Social Work [employer] Agency:  [facility]  Status of Service:  Completed, signed off  If discussed at [employer], dates discussed:    Additional Comments:  Carles Collet, RN 05/04/2017, 1:31 PM

## 2017-05-04 NOTE — Progress Notes (Signed)
Tami [name] to be D/C'd Home per MD order.  Discussed with the patient and all questions fully answered.  VSS, Skin clean, dry and intact without evidence of skin break down, no evidence of skin tears noted. IV catheter discontinued intact. Site without signs and symptoms of complications. Dressing and pressure applied.  An After Visit Summary was printed and given to [name]  D/c education completed with [name], including follow up instructions, medication list, d/c activities limitations if indicated, with other d/c instructions as indicated by MD. Hulen Skains patient husband that pt will be transported home by ambulance.  Patient instructed to return to ED, call 911, or call MD for any changes in condition.   Patient escorted via stretcher, and D/C home via ambulance.  Dorris Carnes 05/04/2017 5:16 PM

## 2017-05-04 NOTE — Discharge Summary (Signed)
Physician Discharge Summary  Tami [name] WUJ:[id number] DOB: 27-Jul-1941 DOA: 04/27/2017  PCP: Sinda Du, MD  Admit date: 04/27/2017 Discharge date: 05/04/2017  Admitted From: Home Disposition: Home (husband declined SNF as recommended by therapy)   Recommendations for Outpatient Follow-up:  1. Follow up with PCP in 1-2 weeks 2. Please obtain BMP, magnesium to follow up hypokalemia which was thought to be due to GI losses, then diuretics.  3. She is euvolemic, having required large volume resuscitation initially, then diuresis. Echo without heart failure.  4. Completed course of antibiotics for UTI.  Home Health: PT, OT, aide, social worker Equipment/Devices: None Discharge Condition: Stable CODE STATUS: Full Diet recommendation: Heart healthy  Brief/Interim Summary: VERTA [name] is a 76 y.o. female with a history of dementia and atrial fibrillation who presented on 9/1 with weakness, urinary complaints, and intermittent loose stools. She was hypotensive despite 4L NS, requiring phenylephrine, and UA demonstrated pyuria. AFib with RVR was noted for which rate-control medications were given and she was admitted to the ICU for sepsis due to UTI. Broad spectrum antibiotics were started and blood cultures drawn. Urine culture was noncloncal and blood cultures have grown GPC in 1 of 2, CoNS on rapid ID. Pressors were weaned off 9/2 and the patient was transferred to the SDU on diltiazem gtt for ongoing RVR. BP has improved, oral medications restarted and diltiazem infusion was titrated off 9/4. Diarrhea has decreased and electrolyte derangements have improved with replacement. She had volume overload with hypoxia which is improving with diuresis.   Discharge Diagnoses:  Active Problems:   Atrial fibrillation with RVR (HCC)   Dementia   Sepsis ([place])   Septic shock (HCC)   UTI (urinary tract infection)   AKI (acute kidney injury) ([place])   Diarrhea   Transient alteration of  awareness  Septic shock due to UTI source, though urine culture is nonclonal. Shock has resolved, pressors weaned 9/2. Lactic acid normalized.  - Received vancomycin, aztreonam and levaquin 9/1, meropenem 9/1 - 9/2, ceftriaxone 9/2 - 9/8.  Acute hypoxic respiratory failure: Due to sepsis, complicated by pulmonary vascular congestion. Echo with preserved EF, no comment on diastolic function due to AFib, no WMA's. Dilated IVC, normal RV structure/function. Received high volume resuscitation early in course (total +17.4L). - Continue supplemental oxygen as needed to maintain SpO2 >90%.  - Improved balance with lasix, now net +~2L and was felt to be volume down at arrival, no crackles or edema. Will not continue lasix. Creatinine continues to improve and respiratory status also improving.    1 of 2 blood cultures + CoNS:  - Monitor cultures. Suspect contaminant.  AFib with RVR: Preserved EF on echo 9/4 - Continue metoprolol at home dose, digoxin and diltiazem po. Rate rising modestly in past 24 hours due to pt refusing medications. Suspect this will improve when home with husband.  - Optimizing electrolytes, Ca, Mg, K.   Hypokalemia: Improving with decreasing GI losses, now caused by loop diuretic.  - Continued to replace daily with diuresis. Improved to 3.2 with pt no longer allowing blood draws. Will not send home with potassium supplement as lasix is no longer required.  - Please recheck BMP, Mg at follow up. Goal is K >4 and Mg >2.  Acute renal failure: Resolved, due to sepsis.  - Monitor BMP  - Ok to restart ACE  Diarrhea: C. diff negative.  - Monitor, resolved.  Demand ischemia: Troponins trending downward. No chest pain. No inpatient intervention. - Continue aspirin, beta  blocker.   Hyperlipidemia:  - Restart statin  Dementia: Chronic, stable. Suspect acute metabolic encephalopathy as well, since resolved.  - Restart aricept, doxepin - Delirium precautions, would be  better at home.  Discharge Instructions Discharge Instructions    Call MD for:  difficulty breathing, headache or visual disturbances    Complete by:  As directed    Call MD for:  persistant nausea and vomiting    Complete by:  As directed    Call MD for:  severe uncontrolled pain    Complete by:  As directed    Call MD for:  temperature >100.4    Complete by:  As directed    Diet - low sodium heart healthy    Complete by:  As directed    Discharge instructions    Complete by:  As directed    Tami [name] was admitted with septic shock due to an overwhelming urinary tract infection. This slowly improved with antibiotics, which she has completed. Her blood pressure has improved and she is stable for discharge.  - She needs to follow up with her PCP in the next 1 - 2 weeks for recheck of labs.  - Home health services are being arranged prior to discharge. - She will need to continue taking all medications as directed. She has completed her course of antibiotics and medications to improve her total body fluid status, and no further medication changes are required.  - If she develops a fever, seek medical attention right away.     Allergies as of 05/04/2017      Reactions   Penicillins Other (See Comments)   Child hood allergy(unknown reaction) Tolerated meropenem in September 2018      Medication List    TAKE these medications   albuterol (2.5 MG/3ML) 0.083% nebulizer solution Commonly known as:  PROVENTIL Take 3 mLs (2.5 mg total) by nebulization every 4 (four) hours as needed for wheezing or shortness of breath.   aspirin 325 MG EC tablet Take 1 tablet (325 mg total) by mouth 2 (two) times daily.   benazepril 20 MG tablet Commonly known as:  LOTENSIN Take 20 mg by mouth daily. Reported on 10/17/2015   CALCIUM PO Take 2 tablets by mouth every morning.   digoxin 0.25 MG tablet Commonly known as:  LANOXIN Take 1 tablet (0.25 mg total) by mouth daily.   diltiazem 120 MG 24  hr capsule Commonly known as:  CARDIZEM CD Take 120mg  by mouth once daily   donepezil 10 MG tablet Commonly known as:  ARICEPT Take 20 mg by mouth at bedtime.   doxepin 150 MG capsule Commonly known as:  SINEQUAN Take 150 mg by mouth at bedtime.   feeding supplement Liqd Take 1 Container by mouth 2 (two) times daily between meals.   gabapentin 400 MG capsule Commonly known as:  NEURONTIN Take 400 mg by mouth 4 (four) times daily as needed (pain).   HYDROcodone-acetaminophen 10-325 MG tablet Commonly known as:  NORCO Take 1 tablet by mouth 4 (four) times daily.   LORazepam 0.5 MG tablet Commonly known as:  ATIVAN Take 1 tablet (0.5 mg total) by mouth every 6 (six) hours as needed for anxiety.   methocarbamol 500 MG tablet Commonly known as:  ROBAXIN Take 1 tablet (500 mg total) by mouth every 6 (six) hours as needed for muscle spasms.   metoCLOPramide 5 MG tablet Commonly known as:  REGLAN Take 1-2 tablets (5-10 mg total) by mouth every 8 (eight) hours  as needed for nausea (if ondansetron (ZOFRAN) ineffective.).   metoprolol tartrate 50 MG tablet Commonly known as:  LOPRESSOR Take 1.5 tablets (75 mg total) by mouth every 6 (six) hours. What changed:  how much to take   pravastatin 40 MG tablet Commonly known as:  PRAVACHOL Take 40 mg by mouth every evening.   sertraline 100 MG tablet Commonly known as:  ZOLOFT Take 200 mg by mouth every morning.   zolpidem 5 MG tablet Commonly known as:  AMBIEN Take 5mg  by mouth once daily at bedtime            Discharge Care Instructions        Start     Ordered   05/04/17 0000  feeding supplement (BOOST / RESOURCE BREEZE) LIQD  2 times daily between meals     05/04/17 1203   05/04/17 0000  Diet - low sodium heart healthy     05/04/17 1203   05/04/17 0000  Discharge instructions    Comments:  Mrs. Wilbon was admitted with septic shock due to an overwhelming urinary tract infection. This slowly improved with  antibiotics, which she has completed. Her blood pressure has improved and she is stable for discharge.  - She needs to follow up with her PCP in the next 1 - 2 weeks for recheck of labs.  - Home health services are being arranged prior to discharge. - She will need to continue taking all medications as directed. She has completed her course of antibiotics and medications to improve her total body fluid status, and no further medication changes are required.  - If she develops a fever, seek medical attention right away.   05/04/17 1203   05/04/17 0000  Call MD for:  temperature >100.4     05/04/17 1203   05/04/17 0000  Call MD for:  persistant nausea and vomiting     05/04/17 1203   05/04/17 0000  Call MD for:  severe uncontrolled pain     05/04/17 1203   05/04/17 0000  Call MD for:  difficulty breathing, headache or visual disturbances     05/04/17 1203      Allergies  Allergen Reactions  . Penicillins Other (See Comments)    Child hood allergy(unknown reaction) Tolerated meropenem in September 2018    Consultations:  PCCM  Procedures/Studies: Dg Chest Port 1 View  Result Date: 04/27/2017 CLINICAL DATA:  Right IJ central line placement, atrial fibrillation, hypertension EXAM: PORTABLE CHEST 1 VIEW COMPARISON:  04/27/2017 FINDINGS: Right IJ central line placement at the mid SVC level. Stable cardiomegaly with central vascular congestion and minor basilar atelectasis. No focal, collapse or consolidation. No large effusion or pneumothorax. Aortic atherosclerotic. Degenerative changes of the spine with an associated scoliosis. IMPRESSION: New right IJ central line tip mid SVC level Cardiomegaly with vascular congestion Thoracic aortic atherosclerosis Electronically Signed   By: Jerilynn Mages.  Shick M.D.   On: 04/27/2017 09:09   Dg Chest Port 1 View  Result Date: 04/27/2017 CLINICAL DATA:  Altered mental status EXAM: PORTABLE CHEST 1 VIEW COMPARISON:  10/20/2015 chest radiograph. FINDINGS: Left  rotated chest radiograph. Low lung volumes. Stable cardiomediastinal silhouette with top-normal heart size and aortic atherosclerosis. No pneumothorax. No pleural effusion. Vascular crowding without overt pulmonary edema. IMPRESSION: Limited hypoinspiratory rotated chest radiograph. No active cardiopulmonary disease. Electronically Signed   By: Ilona Sorrel M.D.   On: 04/27/2017 01:56   Subjective: Confused, improved level of alertness this morning. No overnight events. Afebrile.   Discharge  Exam: Vitals:   05/04/17 0516 05/04/17 1000  BP: 131/76 (!) 135/102  Pulse: (!) 103 97  Resp: 18   Temp: 98.8 F (37.1 C)   SpO2: 94%    General: Pt is alert, awake, not in acute distress Cardiovascular: Irreg, rate ~90bpm. No murmur, JVD or edema Respiratory: Nonlabored, no wheezes or crackles bilaterally Abdominal: Soft, NT, ND, bowel sounds + Neuro: Alert, confused, not cooperative with exam but no focal deficits noted  Labs: Basic Metabolic Panel:  Recent Labs Lab 04/29/17 0443 04/30/17 0530 05/01/17 0310 05/02/17 0858 05/03/17 0522  NA 143 143 144 144 144  K 3.2* 2.9* 3.6 3.3* 3.2*  CL 113* 108 104 99* 98*  CO2 22 29 31  33* 34*  GLUCOSE 66 98 94 100* 107*  BUN 20 10 5* 7 9  CREATININE 1.22* 0.82 0.80 0.77 0.72  CALCIUM 7.7* 8.0* 8.1* 8.4* 8.5*  MG 1.5* 1.5* 1.7  --  1.6*   CBC:  Recent Labs Lab 04/28/17 1230 04/29/17 0443 04/30/17 0530  WBC 11.3* 10.6* 7.5  NEUTROABS  --  8.8*  --   HGB 11.0* 11.2* 11.4*  HCT 35.2* 35.7* 36.0  MCV 85.0 83.6 84.1  PLT 231 249 255   Cardiac Enzymes:  Recent Labs Lab 04/27/17 1427 04/27/17 2250 04/29/17 0443  TROPONINI 0.96* 0.85* 0.38*   CBG:  Recent Labs Lab 04/27/17 2053 04/28/17 0014 04/28/17 0414 04/28/17 0803 04/28/17 1134  GLUCAP 99 76 81 89 75   Urinalysis    Component Value Date/Time   COLORURINE YELLOW 04/27/2017 0229   APPEARANCEUR TURBID (A) 04/27/2017 0229   LABSPEC 1.015 04/27/2017 0229   PHURINE 6.0  04/27/2017 0229   GLUCOSEU NEGATIVE 04/27/2017 0229   HGBUR MODERATE (A) 04/27/2017 0229   HGBUR negative 04/21/2008 1015   BILIRUBINUR NEGATIVE 04/27/2017 0229   KETONESUR NEGATIVE 04/27/2017 0229   PROTEINUR 100 (A) 04/27/2017 0229   UROBILINOGEN 0.2 10/01/2014 0040   NITRITE NEGATIVE 04/27/2017 0229   LEUKOCYTESUR SMALL (A) 04/27/2017 0229    Microbiology Recent Results (from the past 240 hour(s))  Blood Culture (routine x 2)     Status: None   Collection Time: 04/27/17  1:09 AM  Result Value Ref Range Status   Specimen Description LEFT ANTECUBITAL  Final   Special Requests   Final    BOTTLES DRAWN AEROBIC AND ANAEROBIC Blood Culture adequate volume   Culture NO GROWTH 5 DAYS  Final   Report Status 05/02/2017 FINAL  Final  Blood Culture (routine x 2)     Status: Abnormal   Collection Time: 04/27/17  1:39 AM  Result Value Ref Range Status   Specimen Description BLOOD LEFT HAND  Final   Special Requests   Final    BOTTLES DRAWN AEROBIC ONLY Blood Culture results may not be optimal due to an inadequate volume of blood received in culture bottles   Culture  Setup Time   Final    GRAM POSITIVE COCCI AEB BOTTLE Gram Stain Report Called to,Read Back By and Verified With: ANN TUTTLE , RN AT [facility] ED @ 1105 ON 9.2.18 BY BOWMAN,L DAMETRIA PARKS, RN AT [facility] @ 1125 ON 9.2.18 BY BOWMAN,L CRITICAL RESULT CALLED TO, READ BACK BY AND VERIFIED WITH: A. MAYER, RPHARMD AT 1610 ON 04/28/17 BY C. JESSUP, MLT.    Culture (A)  Final    STAPHYLOCOCCUS SPECIES (COAGULASE NEGATIVE) THE SIGNIFICANCE OF ISOLATING THIS ORGANISM FROM A SINGLE SET OF BLOOD CULTURES WHEN MULTIPLE SETS ARE DRAWN IS UNCERTAIN. PLEASE  NOTIFY THE MICROBIOLOGY DEPARTMENT WITHIN ONE WEEK IF SPECIATION AND SENSITIVITIES ARE REQUIRED. Performed at [facility], [place] [address] [place], [place] [address]    Report Status 04/29/2017 FINAL  Final  Blood Culture ID Panel (Reflexed)     Status: Abnormal   Collection Time: 04/27/17   1:39 AM  Result Value Ref Range Status   Enterococcus species NOT DETECTED NOT DETECTED Final   Listeria monocytogenes NOT DETECTED NOT DETECTED Final   Staphylococcus species DETECTED (A) NOT DETECTED Final    Comment: Methicillin (oxacillin) resistant coagulase negative staphylococcus. Possible blood culture contaminant (unless isolated from more than one blood culture draw or clinical case suggests pathogenicity). No antibiotic treatment is indicated for blood  culture contaminants. CRITICAL RESULT CALLED TO, READ BACK BY AND VERIFIED WITH: A. MAYER, RPHARMD AT 1610 ON 04/28/17 BY C. JESSUP, MLT.    Staphylococcus aureus NOT DETECTED NOT DETECTED Final   Methicillin resistance DETECTED (A) NOT DETECTED Final    Comment: CRITICAL RESULT CALLED TO, READ BACK BY AND VERIFIED WITH: A. MAYER, RPHARMD AT 1610 ON 04/28/17 BY C. JESSUP, MLT.    Streptococcus species NOT DETECTED NOT DETECTED Final   Streptococcus agalactiae NOT DETECTED NOT DETECTED Final   Streptococcus pneumoniae NOT DETECTED NOT DETECTED Final   Streptococcus pyogenes NOT DETECTED NOT DETECTED Final   Acinetobacter baumannii NOT DETECTED NOT DETECTED Final   Enterobacteriaceae species NOT DETECTED NOT DETECTED Final   Enterobacter cloacae complex NOT DETECTED NOT DETECTED Final   Escherichia coli NOT DETECTED NOT DETECTED Final   Klebsiella oxytoca NOT DETECTED NOT DETECTED Final   Klebsiella pneumoniae NOT DETECTED NOT DETECTED Final   Proteus species NOT DETECTED NOT DETECTED Final   Serratia marcescens NOT DETECTED NOT DETECTED Final   Haemophilus influenzae NOT DETECTED NOT DETECTED Final   Neisseria meningitidis NOT DETECTED NOT DETECTED Final   Pseudomonas aeruginosa NOT DETECTED NOT DETECTED Final   Candida albicans NOT DETECTED NOT DETECTED Final   Candida glabrata NOT DETECTED NOT DETECTED Final   Candida krusei NOT DETECTED NOT DETECTED Final   Candida parapsilosis NOT DETECTED NOT DETECTED Final   Candida  tropicalis NOT DETECTED NOT DETECTED Final    Comment: Performed at [facility], [place] [address] [place], [place] [address]  Urine culture     Status: Abnormal   Collection Time: 04/27/17  2:29 AM  Result Value Ref Range Status   Specimen Description URINE, CATHETERIZED  Final   Special Requests NONE  Final   Culture MULTIPLE SPECIES PRESENT, SUGGEST RECOLLECTION (A)  Final   Report Status 04/28/2017 FINAL  Final  MRSA PCR Screening     Status: None   Collection Time: 04/27/17  5:54 AM  Result Value Ref Range Status   MRSA by PCR NEGATIVE NEGATIVE Final    Comment:        The GeneXpert MRSA Assay (FDA approved for NASAL specimens only), is one component of a comprehensive MRSA colonization surveillance program. It is not intended to diagnose MRSA infection nor to guide or monitor treatment for MRSA infections.   C difficile quick scan w PCR reflex     Status: None   Collection Time: 04/27/17  2:18 PM  Result Value Ref Range Status   C Diff antigen NEGATIVE NEGATIVE Final   C Diff toxin NEGATIVE NEGATIVE Final   C Diff interpretation No C. difficile detected.  Final    Time coordinating discharge: Approximately 40 minutes  Vance Gather, MD  [facility] 05/04/2017, 12:03 PM Pager [phone]

## 2017-05-06 DIAGNOSIS — F0390 Unspecified dementia without behavioral disturbance: Secondary | ICD-10-CM | POA: Diagnosis not present

## 2017-05-06 DIAGNOSIS — I10 Essential (primary) hypertension: Secondary | ICD-10-CM | POA: Diagnosis not present

## 2017-05-06 DIAGNOSIS — Z8673 Personal history of transient ischemic attack (TIA), and cerebral infarction without residual deficits: Secondary | ICD-10-CM | POA: Diagnosis not present

## 2017-05-06 DIAGNOSIS — N390 Urinary tract infection, site not specified: Secondary | ICD-10-CM | POA: Diagnosis not present

## 2017-05-06 DIAGNOSIS — M1990 Unspecified osteoarthritis, unspecified site: Secondary | ICD-10-CM | POA: Diagnosis not present

## 2017-05-06 DIAGNOSIS — Z87891 Personal history of nicotine dependence: Secondary | ICD-10-CM | POA: Diagnosis not present

## 2017-05-06 DIAGNOSIS — I248 Other forms of acute ischemic heart disease: Secondary | ICD-10-CM | POA: Diagnosis not present

## 2017-05-06 DIAGNOSIS — I4891 Unspecified atrial fibrillation: Secondary | ICD-10-CM | POA: Diagnosis not present

## 2017-05-07 DIAGNOSIS — Z87891 Personal history of nicotine dependence: Secondary | ICD-10-CM | POA: Diagnosis not present

## 2017-05-07 DIAGNOSIS — I4891 Unspecified atrial fibrillation: Secondary | ICD-10-CM | POA: Diagnosis not present

## 2017-05-07 DIAGNOSIS — I248 Other forms of acute ischemic heart disease: Secondary | ICD-10-CM | POA: Diagnosis not present

## 2017-05-07 DIAGNOSIS — M1990 Unspecified osteoarthritis, unspecified site: Secondary | ICD-10-CM | POA: Diagnosis not present

## 2017-05-07 DIAGNOSIS — I10 Essential (primary) hypertension: Secondary | ICD-10-CM | POA: Diagnosis not present

## 2017-05-07 DIAGNOSIS — F0390 Unspecified dementia without behavioral disturbance: Secondary | ICD-10-CM | POA: Diagnosis not present

## 2017-05-07 DIAGNOSIS — N390 Urinary tract infection, site not specified: Secondary | ICD-10-CM | POA: Diagnosis not present

## 2017-05-07 DIAGNOSIS — Z8673 Personal history of transient ischemic attack (TIA), and cerebral infarction without residual deficits: Secondary | ICD-10-CM | POA: Diagnosis not present

## 2017-05-08 DIAGNOSIS — I4891 Unspecified atrial fibrillation: Secondary | ICD-10-CM | POA: Diagnosis not present

## 2017-05-08 DIAGNOSIS — Z8673 Personal history of transient ischemic attack (TIA), and cerebral infarction without residual deficits: Secondary | ICD-10-CM | POA: Diagnosis not present

## 2017-05-08 DIAGNOSIS — Z87891 Personal history of nicotine dependence: Secondary | ICD-10-CM | POA: Diagnosis not present

## 2017-05-08 DIAGNOSIS — I248 Other forms of acute ischemic heart disease: Secondary | ICD-10-CM | POA: Diagnosis not present

## 2017-05-08 DIAGNOSIS — I10 Essential (primary) hypertension: Secondary | ICD-10-CM | POA: Diagnosis not present

## 2017-05-08 DIAGNOSIS — N390 Urinary tract infection, site not specified: Secondary | ICD-10-CM | POA: Diagnosis not present

## 2017-05-08 DIAGNOSIS — M1990 Unspecified osteoarthritis, unspecified site: Secondary | ICD-10-CM | POA: Diagnosis not present

## 2017-05-08 DIAGNOSIS — F0390 Unspecified dementia without behavioral disturbance: Secondary | ICD-10-CM | POA: Diagnosis not present

## 2017-05-09 DIAGNOSIS — Z8673 Personal history of transient ischemic attack (TIA), and cerebral infarction without residual deficits: Secondary | ICD-10-CM | POA: Diagnosis not present

## 2017-05-09 DIAGNOSIS — I248 Other forms of acute ischemic heart disease: Secondary | ICD-10-CM | POA: Diagnosis not present

## 2017-05-09 DIAGNOSIS — M1990 Unspecified osteoarthritis, unspecified site: Secondary | ICD-10-CM | POA: Diagnosis not present

## 2017-05-09 DIAGNOSIS — N390 Urinary tract infection, site not specified: Secondary | ICD-10-CM | POA: Diagnosis not present

## 2017-05-09 DIAGNOSIS — F0390 Unspecified dementia without behavioral disturbance: Secondary | ICD-10-CM | POA: Diagnosis not present

## 2017-05-09 DIAGNOSIS — Z87891 Personal history of nicotine dependence: Secondary | ICD-10-CM | POA: Diagnosis not present

## 2017-05-09 DIAGNOSIS — I4891 Unspecified atrial fibrillation: Secondary | ICD-10-CM | POA: Diagnosis not present

## 2017-05-09 DIAGNOSIS — I10 Essential (primary) hypertension: Secondary | ICD-10-CM | POA: Diagnosis not present

## 2017-05-11 DIAGNOSIS — M1990 Unspecified osteoarthritis, unspecified site: Secondary | ICD-10-CM | POA: Diagnosis not present

## 2017-05-11 DIAGNOSIS — I10 Essential (primary) hypertension: Secondary | ICD-10-CM | POA: Diagnosis not present

## 2017-05-11 DIAGNOSIS — I248 Other forms of acute ischemic heart disease: Secondary | ICD-10-CM | POA: Diagnosis not present

## 2017-05-11 DIAGNOSIS — I4891 Unspecified atrial fibrillation: Secondary | ICD-10-CM | POA: Diagnosis not present

## 2017-05-11 DIAGNOSIS — F0390 Unspecified dementia without behavioral disturbance: Secondary | ICD-10-CM | POA: Diagnosis not present

## 2017-05-11 DIAGNOSIS — Z8673 Personal history of transient ischemic attack (TIA), and cerebral infarction without residual deficits: Secondary | ICD-10-CM | POA: Diagnosis not present

## 2017-05-11 DIAGNOSIS — Z87891 Personal history of nicotine dependence: Secondary | ICD-10-CM | POA: Diagnosis not present

## 2017-05-11 DIAGNOSIS — N390 Urinary tract infection, site not specified: Secondary | ICD-10-CM | POA: Diagnosis not present

## 2017-05-13 DIAGNOSIS — I4891 Unspecified atrial fibrillation: Secondary | ICD-10-CM | POA: Diagnosis not present

## 2017-05-13 DIAGNOSIS — I248 Other forms of acute ischemic heart disease: Secondary | ICD-10-CM | POA: Diagnosis not present

## 2017-05-13 DIAGNOSIS — M1990 Unspecified osteoarthritis, unspecified site: Secondary | ICD-10-CM | POA: Diagnosis not present

## 2017-05-13 DIAGNOSIS — N390 Urinary tract infection, site not specified: Secondary | ICD-10-CM | POA: Diagnosis not present

## 2017-05-13 DIAGNOSIS — I10 Essential (primary) hypertension: Secondary | ICD-10-CM | POA: Diagnosis not present

## 2017-05-13 DIAGNOSIS — F0390 Unspecified dementia without behavioral disturbance: Secondary | ICD-10-CM | POA: Diagnosis not present

## 2017-05-13 DIAGNOSIS — Z87891 Personal history of nicotine dependence: Secondary | ICD-10-CM | POA: Diagnosis not present

## 2017-05-13 DIAGNOSIS — Z8673 Personal history of transient ischemic attack (TIA), and cerebral infarction without residual deficits: Secondary | ICD-10-CM | POA: Diagnosis not present

## 2017-05-14 DIAGNOSIS — Z87891 Personal history of nicotine dependence: Secondary | ICD-10-CM | POA: Diagnosis not present

## 2017-05-14 DIAGNOSIS — Z8673 Personal history of transient ischemic attack (TIA), and cerebral infarction without residual deficits: Secondary | ICD-10-CM | POA: Diagnosis not present

## 2017-05-14 DIAGNOSIS — N390 Urinary tract infection, site not specified: Secondary | ICD-10-CM | POA: Diagnosis not present

## 2017-05-14 DIAGNOSIS — I10 Essential (primary) hypertension: Secondary | ICD-10-CM | POA: Diagnosis not present

## 2017-05-14 DIAGNOSIS — M1990 Unspecified osteoarthritis, unspecified site: Secondary | ICD-10-CM | POA: Diagnosis not present

## 2017-05-14 DIAGNOSIS — I4891 Unspecified atrial fibrillation: Secondary | ICD-10-CM | POA: Diagnosis not present

## 2017-05-14 DIAGNOSIS — I248 Other forms of acute ischemic heart disease: Secondary | ICD-10-CM | POA: Diagnosis not present

## 2017-05-14 DIAGNOSIS — F0390 Unspecified dementia without behavioral disturbance: Secondary | ICD-10-CM | POA: Diagnosis not present

## 2017-05-15 DIAGNOSIS — Z8673 Personal history of transient ischemic attack (TIA), and cerebral infarction without residual deficits: Secondary | ICD-10-CM | POA: Diagnosis not present

## 2017-05-15 DIAGNOSIS — I10 Essential (primary) hypertension: Secondary | ICD-10-CM | POA: Diagnosis not present

## 2017-05-15 DIAGNOSIS — Z87891 Personal history of nicotine dependence: Secondary | ICD-10-CM | POA: Diagnosis not present

## 2017-05-15 DIAGNOSIS — F0390 Unspecified dementia without behavioral disturbance: Secondary | ICD-10-CM | POA: Diagnosis not present

## 2017-05-15 DIAGNOSIS — N390 Urinary tract infection, site not specified: Secondary | ICD-10-CM | POA: Diagnosis not present

## 2017-05-15 DIAGNOSIS — M1990 Unspecified osteoarthritis, unspecified site: Secondary | ICD-10-CM | POA: Diagnosis not present

## 2017-05-15 DIAGNOSIS — I4891 Unspecified atrial fibrillation: Secondary | ICD-10-CM | POA: Diagnosis not present

## 2017-05-15 DIAGNOSIS — I248 Other forms of acute ischemic heart disease: Secondary | ICD-10-CM | POA: Diagnosis not present

## 2017-06-27 DEATH — deceased
